# Patient Record
Sex: Female | Born: 1955 | Race: White | Hispanic: No | Marital: Married | Smoking: Former smoker
Health system: Southern US, Community
[De-identification: ages and names within clinical notes are randomized; demographics above are authoritative.]

## PROBLEM LIST (undated history)

## (undated) DIAGNOSIS — N189 Chronic kidney disease, unspecified: Secondary | ICD-10-CM

## (undated) DIAGNOSIS — Q249 Congenital malformation of heart, unspecified: Secondary | ICD-10-CM

## (undated) DIAGNOSIS — H269 Unspecified cataract: Secondary | ICD-10-CM

## (undated) DIAGNOSIS — I639 Cerebral infarction, unspecified: Secondary | ICD-10-CM

## (undated) DIAGNOSIS — M7580 Other shoulder lesions, unspecified shoulder: Secondary | ICD-10-CM

## (undated) DIAGNOSIS — R011 Cardiac murmur, unspecified: Secondary | ICD-10-CM

## (undated) DIAGNOSIS — T7840XA Allergy, unspecified, initial encounter: Secondary | ICD-10-CM

## (undated) DIAGNOSIS — K219 Gastro-esophageal reflux disease without esophagitis: Secondary | ICD-10-CM

## (undated) DIAGNOSIS — G43109 Migraine with aura, not intractable, without status migrainosus: Secondary | ICD-10-CM

## (undated) DIAGNOSIS — M25561 Pain in right knee: Secondary | ICD-10-CM

## (undated) DIAGNOSIS — I10 Essential (primary) hypertension: Secondary | ICD-10-CM

## (undated) DIAGNOSIS — E785 Hyperlipidemia, unspecified: Secondary | ICD-10-CM

## (undated) HISTORY — PX: CHOLECYSTECTOMY: SHX55

## (undated) HISTORY — DX: Cerebral infarction, unspecified: I63.9

## (undated) HISTORY — DX: Congenital malformation of heart, unspecified: Q24.9

## (undated) HISTORY — DX: Other shoulder lesions, unspecified shoulder: M75.80

## (undated) HISTORY — DX: Chronic kidney disease, unspecified: N18.9

## (undated) HISTORY — DX: Unspecified cataract: H26.9

## (undated) HISTORY — DX: Allergy, unspecified, initial encounter: T78.40XA

## (undated) HISTORY — DX: Gastro-esophageal reflux disease without esophagitis: K21.9

## (undated) HISTORY — PX: GALLBLADDER SURGERY: SHX652

## (undated) HISTORY — PX: OTHER SURGICAL HISTORY: SHX169

## (undated) HISTORY — DX: Cardiac murmur, unspecified: R01.1

## (undated) HISTORY — DX: Pain in right knee: M25.561

## (undated) HISTORY — DX: Essential (primary) hypertension: I10

## (undated) HISTORY — DX: Hyperlipidemia, unspecified: E78.5

## (undated) HISTORY — DX: Migraine with aura, not intractable, without status migrainosus: G43.109

---
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
## 1986-07-03 HISTORY — PX: TUBAL LIGATION: SHX77

## 1998-07-03 HISTORY — PX: OTHER SURGICAL HISTORY: SHX169

## 1998-07-03 HISTORY — PX: FOOT TENDON SURGERY: SHX958

## 2008-11-24 ENCOUNTER — Ambulatory Visit: Payer: Self-pay | Admitting: Family Medicine

## 2012-10-31 HISTORY — PX: OTHER SURGICAL HISTORY: SHX169

## 2012-12-27 LAB — BASIC METABOLIC PANEL
Creatinine: 1 mg/dL (ref 0.5–1.1)
Potassium: 4.3 mmol/L (ref 3.4–5.3)

## 2013-06-10 ENCOUNTER — Ambulatory Visit (INDEPENDENT_AMBULATORY_CARE_PROVIDER_SITE_OTHER): Payer: BC Managed Care – PPO | Admitting: Family Medicine

## 2013-06-10 ENCOUNTER — Encounter: Payer: Self-pay | Admitting: Family Medicine

## 2013-06-10 VITALS — BP 143/82 | HR 96 | Ht 64.5 in | Wt 149.0 lb

## 2013-06-10 DIAGNOSIS — G43109 Migraine with aura, not intractable, without status migrainosus: Secondary | ICD-10-CM

## 2013-06-10 DIAGNOSIS — F418 Other specified anxiety disorders: Secondary | ICD-10-CM

## 2013-06-10 DIAGNOSIS — IMO0001 Reserved for inherently not codable concepts without codable children: Secondary | ICD-10-CM

## 2013-06-10 DIAGNOSIS — M25561 Pain in right knee: Secondary | ICD-10-CM | POA: Insufficient documentation

## 2013-06-10 DIAGNOSIS — E785 Hyperlipidemia, unspecified: Secondary | ICD-10-CM

## 2013-06-10 DIAGNOSIS — I10 Essential (primary) hypertension: Secondary | ICD-10-CM

## 2013-06-10 DIAGNOSIS — G459 Transient cerebral ischemic attack, unspecified: Secondary | ICD-10-CM

## 2013-06-10 DIAGNOSIS — M25569 Pain in unspecified knee: Secondary | ICD-10-CM

## 2013-06-10 DIAGNOSIS — Z1239 Encounter for other screening for malignant neoplasm of breast: Secondary | ICD-10-CM

## 2013-06-10 DIAGNOSIS — F411 Generalized anxiety disorder: Secondary | ICD-10-CM

## 2013-06-10 DIAGNOSIS — Q219 Congenital malformation of cardiac septum, unspecified: Secondary | ICD-10-CM

## 2013-06-10 DIAGNOSIS — G43809 Other migraine, not intractable, without status migrainosus: Secondary | ICD-10-CM

## 2013-06-10 HISTORY — DX: Migraine with aura, not intractable, without status migrainosus: G43.109

## 2013-06-10 HISTORY — DX: Hyperlipidemia, unspecified: E78.5

## 2013-06-10 HISTORY — DX: Reserved for inherently not codable concepts without codable children: IMO0001

## 2013-06-10 HISTORY — DX: Pain in right knee: M25.561

## 2013-06-10 HISTORY — DX: Essential (primary) hypertension: I10

## 2013-06-10 HISTORY — DX: Other specified anxiety disorders: F41.8

## 2013-06-10 HISTORY — DX: Transient cerebral ischemic attack, unspecified: G45.9

## 2013-06-10 MED ORDER — LISINOPRIL 20 MG PO TABS: 20.0000 mg | ORAL_TABLET | Freq: Every day | ORAL | Status: DC

## 2013-06-10 MED ORDER — CLONAZEPAM 0.5 MG PO TABS: 0.2500 mg | ORAL_TABLET | Freq: Every day | ORAL | Status: DC | PRN

## 2013-06-10 NOTE — Progress Notes (Signed)
CC: Lindsay Moore is a 57 y.o. female is here for Establish Care   Subjective: HPI:  HTN: History of hypertension has been taking lisinopril a daily basis for approximately 3 years. She reports that home blood pressures are consistently below 120/80. She denies hypo-tensive like symptoms. Denies cough or known side effects  HLD: History of hyperlipidemia she's unsure what her last LDL was but has been checked over a year ago. She denies any history of cardiovascular or peripheral vascular disease other than a questionable TIAs listed below.  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 She was evaluated [facility] per her report for at least 24 hours and was told that she had a hole in her heart she is unsure where possibly the septum. She tells me she had seen a neurologist after this hospitalization was told only to start a baby aspirin there is no definitive diagnosis of whether or not this is an ocular migraine or TIA. Since then she denies any new motor or sensory disturbances or any visual disturbances.  Reports a history of right knee pain ever since an accident at school she is currently seeing workers compensation for management of this  Patient complains of stress that is of moderate severity interfering with her quality of life that is causing subjective anxiety. This is predominantly caused by her mother moving in with them nothing else seems to make it better or worse.  Reports overwhelming feelings of responsibility towards her mother this occurs once a week and usually will last hours. Rare alcohol use no tobacco or recreational drug use  Review of Systems - General ROS: negative for - chills, fever, night sweats, weight gain or weight  loss Ophthalmic ROS: negative for - decreased vision Psychological ROS: negative for -  depression ENT ROS: negative for - hearing change, nasal congestion, tinnitus or allergies Hematological and Lymphatic ROS: negative for - bleeding problems, bruising or swollen lymph nodes Breast ROS: negative Respiratory ROS: no cough, shortness of breath, or wheezing Cardiovascular ROS: no chest pain or dyspnea on exertion Gastrointestinal ROS: no abdominal pain, change in bowel habits, or black or bloody stools Genito-Urinary ROS: negative for - genital discharge, genital ulcers, incontinence or abnormal bleeding from genitals Musculoskeletal ROS: negative for - joint pain or muscle pain other than right knee pain above Neurological ROS: negative for - headaches or memory loss Dermatological ROS: negative for lumps, mole changes, rash and skin lesion changes  Past Medical History  Diagnosis Date  . Essential hypertension, benign 06/10/2013  . Hyperlipidemia 06/10/2013  . 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 Right knee pain 06/10/2013    Workers Comp: [facility] Dr. Luiz Blare      History reviewed. No pertinent family history.   History  Substance Use Topics  . Smoking status: Former Games developer  . Smokeless tobacco: Not on file  . Alcohol Use: .5 - 1 oz/week    1-2 drink(s) per week     Objective: Filed Vitals:   06/10/13 1606  BP: 143/82  Pulse: 96    General: Alert and Oriented, No Acute Distress HEENT: Pupils equal, round, reactive to light. Conjunctivae clear.  Moist mucous membranes pharynx unremarkable Lungs: Clear to auscultation bilaterally, no wheezing/ronchi/rales.  Comfortable work of breathing. Good air movement. Cardiac: Regular rate and  rhythm. Normal S1/S2.  No murmurs, rubs, nor gallops.   Abdomen: Mild overweight soft nontender Extremities: No peripheral edema.  Strong peripheral pulses.  Mental Status: No depression, nor agitation however mild anxiety and tearfulness when  speaking about her mother Skin: Warm and dry.  Assessment & Plan: Lindsay was seen today for establish care.  Diagnoses and associated orders for this visit:  Essential hypertension, benign - BASIC METABOLIC PANEL WITH GFR - lisinopril (PRINIVIL,ZESTRIL) 20 MG tablet; Take 1 tablet (20 mg total) by mouth daily.  TIA (transient ischemic attack)  Ocular migraine  Septal defect  Screening for breast cancer - MM Digital Screening; Future  Right knee pain  Situational anxiety - clonazePAM (KLONOPIN) 0.5 MG tablet; Take 0.5-1 tablets (0.25-0.5 mg total) by mouth daily as needed for anxiety.  Hyperlipidemia - Lipid panel    Essential hypertension: Uncontrolled chronic condition, we discussed the DASH diet will recheck blood pressure in one week when she returns continue lisinopril for now Transient ischemic attack: We Will request outside records from [facility] to determine whether or not she truly had a TIA and to clarify her subjective septal defect in her heart Breast cancer screening overdue for mammogram were placed Right knee pain: Deferred to [facility] orthopedics Situational anxiety: Start as needed clonazepam for uncontrollable anxiety not to use more than 2-3 times a week. Hyperlipidemia: Overdue for lipid panel  45 minutes spent face-to-face during visit today of which at least 50% was counseling or coordinating care regarding hyperlipidemia, essential hypertension, TIA, ocular migraine, septal defect, situational anxiety.   Return in about 1 week (around 06/17/2013) for CPE.

## 2013-06-11 ENCOUNTER — Encounter: Payer: Self-pay | Admitting: *Deleted

## 2013-06-13 ENCOUNTER — Telehealth: Payer: Self-pay | Admitting: Family Medicine

## 2013-06-13 ENCOUNTER — Encounter: Payer: Self-pay | Admitting: Family Medicine

## 2013-06-13 LAB — LIPID PANEL
Cholesterol: 206 mg/dL — ABNORMAL HIGH (ref 0–200)
HDL: 69 mg/dL (ref >39)
LDL Cholesterol: 123 mg/dL — ABNORMAL HIGH (ref 0–99)
Total CHOL/HDL Ratio: 3 Ratio
Triglycerides: 69 mg/dL (ref <150)
VLDL: 14 mg/dL (ref 0–40)

## 2013-06-13 LAB — BASIC METABOLIC PANEL WITH GFR
BUN: 14 mg/dL (ref 6–23)
Calcium: 9.5 mg/dL (ref 8.4–10.5)
Chloride: 107 mEq/L (ref 96–112)
Creat: 0.9 mg/dL (ref 0.50–1.10)
GFR, Est African American: 82 mL/min
GFR, Est Non African American: 71 mL/min

## 2013-06-13 NOTE — Telephone Encounter (Signed)
I met patient in the [place], she said to let you know that she got her colonoscopy done by a Dr Noelle Penner in [place].

## 2013-06-18 ENCOUNTER — Other Ambulatory Visit: Payer: Self-pay | Admitting: Family Medicine

## 2013-06-18 ENCOUNTER — Telehealth: Payer: Self-pay | Admitting: *Deleted

## 2013-06-18 ENCOUNTER — Ambulatory Visit (INDEPENDENT_AMBULATORY_CARE_PROVIDER_SITE_OTHER): Payer: BC Managed Care – PPO | Admitting: Family Medicine

## 2013-06-18 ENCOUNTER — Encounter: Payer: Self-pay | Admitting: Family Medicine

## 2013-06-18 VITALS — BP 134/84 | HR 106 | Ht 64.5 in | Wt 149.0 lb

## 2013-06-18 DIAGNOSIS — Z0000 Encounter for general adult medical examination without abnormal findings: Secondary | ICD-10-CM

## 2013-06-18 DIAGNOSIS — K644 Residual hemorrhoidal skin tags: Secondary | ICD-10-CM

## 2013-06-18 MED ORDER — HYDROCORTISONE ACE-PRAMOXINE 2.5-1 % EX CREA: TOPICAL_CREAM | CUTANEOUS | Status: DC

## 2013-06-18 NOTE — Progress Notes (Signed)
CC: Lindsay Moore is a 57 y.o. female is here for Annual Exam   Subjective: HPI:  Colonoscopy:Dr. Noelle Penner in [place] Unknown date, we are requesting results from this Papsmear: Received normal last year, no history of abnormals we'll consider retesting 2-4 years Mammogram: Order placed last week, scheduled on 30th of this month   Influenza Vaccine: UTD Pneumovax: No current indication Td/Tdap:  She believes this was done well within 10 years Zoster: (Start 57 yo)  Over the past 2 weeks have you been bothered by: - Little interest or pleasure in doing things: No - Feeling down depressed or hopeless: No  Her only complaint today is occasional hemorrhoids that cause itching but no other discomfort she currently is a symptomatic without any rectal discomfort or annoyance.  Rare alcohol use no tobacco or recreational drug use. No formal exercise routine tries to watch what she eats but admits for room for improvement, admits diet high in breads  Review of Systems - General ROS: negative for - chills, fever, night sweats, weight gain or weight loss Ophthalmic ROS: negative for - decreased vision Psychological ROS: negative for - anxiety or depression ENT ROS: negative for - hearing change, nasal congestion, tinnitus or allergies Hematological and Lymphatic ROS: negative for - bleeding problems, bruising or swollen lymph nodes Breast ROS: negative Respiratory ROS: no cough, shortness of breath, or wheezing Cardiovascular ROS: no chest pain or dyspnea on exertion Gastrointestinal ROS: no abdominal pain, change in bowel habits, or black or bloody stools Genito-Urinary ROS: negative for - genital discharge, genital ulcers, incontinence or abnormal bleeding from genitals Musculoskeletal ROS: negative for - joint pain or muscle pain Neurological ROS: negative for - headaches or memory loss Dermatological ROS: negative for lumps, mole changes, rash and skin lesion changes  Past Medical History   Diagnosis Date  . Essential hypertension, benign 06/10/2013  . Hyperlipidemia 06/10/2013  . 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 Right knee pain 06/10/2013    Workers Comp: [facility] Dr. Luiz Blare   . Heart defect     "hole in the heart"     Family History  Problem Relation Age of Onset  . Alcoholism Father   . Lung cancer Father   . Diabetes      grandmother  . Hyperlipidemia Mother   . Hypertension Mother   . Stroke      grandmother     History  Substance Use Topics  . Smoking status: Former Games developer  . Smokeless tobacco: Not on file  . Alcohol Use: .5 - 1 oz/week    1-2 drink(s) per week     Objective: Filed Vitals:   06/18/13 0859  BP: 134/84  Pulse: 106    General: No Acute Distress HEENT: Atraumatic, normocephalic, conjunctivae normal without scleral icterus.  No nasal discharge, hearing grossly intact, TMs with good landmarks bilaterally with no middle ear abnormalities, posterior pharynx clear without oral lesions. Mild eczematous changes on the external ear canal bilaterally Neck: Supple, trachea midline, no cervical nor supraclavicular adenopathy. Pulmonary: Clear to auscultation bilaterally without wheezing, rhonchi, nor rales. Cardiac: Regular rate and rhythm.  No murmurs, rubs, nor gallops. No peripheral edema.  2+ peripheral pulses bilaterally. Abdomen: Bowel sounds normal.  No masses.  Non-tender without rebound.  Negative Murphy's sign. MSK: Grossly intact, no signs of weakness.  Full strength throughout upper and lower extremities.  Full ROM in upper and lower extremities.  No midline spinal tenderness. Neuro: Gait unremarkable, CN II-XII grossly intact.  C5-C6 Reflex 2/4 Bilaterally,  L4 Reflex 2/4 Bilaterally.  Cerebellar function intact. Skin: No rashes. There is a noninflamed skin tag on the back between the scapula.  Psych: Alert and oriented to person/place/time.  Thought process normal. No anxiety/depression.   Assessment & Plan: Lindsay Moore was seen  today for annual exam.  Diagnoses and associated orders for this visit:  Annual physical exam  Hemorrhoids, external - Pramoxine-HC (HYDROCORTISONE ACE-PRAMOXINE) 2.5-1 % CREA; Apply to rectal area twice a day as needed for itching.    Healthy lifestyle interventions including but limited to regular exercise, a healthy low fat diet, moderation of salt intake, the dangers of tobacco/alcohol/recreational drug use, nutrition supplementation, and accident avoidance were discussed with the patient and a handout was provided for future reference. Encouraged her to switch from white bread to whole wheat and also to look for other options to increase whole grains in her diet to help with lowering cholesterol and also possibly weight loss  Awaiting colonoscopy records...  Return in about 3 months (around 09/16/2013) for Routine follow up.

## 2013-06-18 NOTE — Patient Instructions (Signed)
Dr. Reola Buckles's General Advice Following Your Complete Physical Exam  The Benefits of Regular Exercise: Unless you suffer from an uncontrolled cardiovascular condition, studies strongly suggest that regular exercise and physical activity will add to both the quality and length of your life.  The World Health Organization recommends 150 minutes of moderate intensity aerobic activity every week.  This is best split over 3-4 days a week, and can be as simple as a brisk walk for just over 35 minutes "most days of the week".  This type of exercise has been shown to lower LDL-Cholesterol, lower average blood sugars, lower blood pressure, lower cardiovascular disease risk, improve memory, and increase one's overall sense of wellbeing.  The addition of anaerobic (or "strength training") exercises offers additional benefits including but not limited to increased metabolism, prevention of osteoporosis, and improved overall cholesterol levels.  How Can I Strive For A Low-Fat Diet?: Current guidelines recommend that 25-35 percent of your daily energy (food) intake should come from fats.  One might ask how can this be achieved without having to dissect each meal on a daily basis?  Switch to skim or 1% milk instead of whole milk.  Focus on lean meats such as ground turkey, fresh fish, baked chicken, and lean cuts of beef as your source of dietary protein.  Consume less than 300mg/day of dietary cholesterol.  Limit trans fatty acid consumption primarily by limiting synthetic trans fats such as partially hydrogenated oils (Ex: fried fast foods).  Focus efforts on reducing your intake of "solid" fats (Ex: Butter).  Substitute olive or vegetable oil for solid fats where possible.  Moderation of Salt Intake: Provided you don't carry a diagnosis of congestive heart failure nor renal failure, I recommend a daily allowance of no more than 2300 mg of salt (sodium).  Keeping under this daily goal is associated with a  decreased risk of cardiovascular events, creeping above it can lead to elevated blood pressures and increases your risk of cardiovascular events.  Milligrams (mg) of salt is listed on all nutrition labels, and your daily intake can add up faster than you think.  Most canned and frozen dinners can pack in over half your daily salt allowance in one meal.    Lifestyle Health Risks: Certain lifestyle choices carry specific health risks.  As you may already know, tobacco use has been associated with increasing one's risk of cardiovascular disease, pulmonary disease, numerous cancers, among many other issues.  What you may not know is that there are medications and nicotine replacement strategies that can more than double your chances of successfully quitting.  I would be thrilled to help manage your quitting strategy if you currently use tobacco products.  When it comes to alcohol use, I've yet to find an "ideal" daily allowance.  Provided an individual does not have a medical condition that is exacerbated by alcohol consumption, general guidelines determine "safe drinking" as no more than two standard drinks for a man or no more than one standard drink for a female per day.  However, much debate still exists on whether any amount of alcohol consumption is technically "safe".  My general advice, keep alcohol consumption to a minimum for general health promotion.  If you or others believe that alcohol, tobacco, or recreational drug use is interfering with your life, I would be happy to provide confidential counseling regarding treatment options.  General "Over The Counter" Nutrition Advice: Postmenopausal women should aim for a daily calcium intake of 1200 mg, however a significant   portion of this might already be provided by diets including milk, yogurt, cheese, and other dairy products.  Vitamin D has been shown to help preserve bone density, prevent fatigue, and has even been shown to help reduce falls in the  elderly.  Ensuring a daily intake of 800 Units of Vitamin D is a good place to start to enjoy the above benefits, we can easily check your Vitamin D level to see if you'd potentially benefit from supplementation beyond 800 Units a day.  Folic Acid intake should be of particular concern to women of childbearing age.  Daily consumption of 400-800 mcg of Folic Acid is recommended to minimize the chance of spinal cord defects in a fetus should pregnancy occur.    For many adults, accidents still remain one of the most common culprits when it comes to cause of death.  Some of the simplest but most effective preventitive habits you can adopt include regular seatbelt use, proper helmet use, securing firearms, and regularly testing your smoke and carbon monoxide detectors.  Lorrena Goranson B. [facility] DO [facility] [place] [address], [address] [place], [place] [address] Phone: [phone]  Breast Self-Awareness Practicing breast self-awareness may pick up problems early, prevent significant medical complications, and possibly save your life. By practicing breast self-awareness, you can become familiar with how your breasts look and feel and if your breasts are changing. This allows you to notice changes early. It can also offer you some reassurance that your breast health is good. One way to learn what is normal for your breasts and whether your breasts are changing is to do a breast self-exam. If you find a lump or something that was not present in the past, it is best to contact your caregiver right away. Other findings that should be evaluated by your caregiver include nipple discharge, especially if it is bloody; skin changes or reddening; areas where the skin seems to be pulled in (retracted); or new lumps and bumps. Breast pain is seldom associated with cancer (malignancy), but should also be evaluated by a caregiver. HOW TO PERFORM A BREAST SELF-EXAM The best time to examine your breasts is 5 7 days after  your menstrual period is over. During menstruation, the breasts are lumpier, and it may be more difficult to pick up changes. If you do not menstruate, have reached menopause, or had your uterus removed (hysterectomy), you should examine your breasts at regular intervals, such as monthly. If you are breastfeeding, examine your breasts after a feeding or after using a breast pump. Breast implants do not decrease the risk for lumps or tumors, so continue to perform breast self-exams as recommended. Talk to your caregiver about how to determine the difference between the implant and breast tissue. Also, talk about the amount of pressure you should use during the exam. Over time, you will become more familiar with the variations of your breasts and more comfortable with the exam. A breast self-exam requires you to remove all your clothes above the waist. 1. Look at your breasts and nipples. Stand in front of a mirror in a room with good lighting. With your hands on your hips, push your hands firmly downward. Look for a difference in shape, contour, and size from one breast to the other (asymmetry). Asymmetry includes puckers, dips, or bumps. Also, look for skin changes, such as reddened or scaly areas on the breasts. Look for nipple changes, such as discharge, dimpling, repositioning, or redness. 2. Carefully feel your breasts. This is  best done either in the shower or tub while using soapy water or when flat on your back. Place the arm (on the side of the breast you are examining) above your head. Use the pads (not the fingertips) of your three middle fingers on your opposite hand to feel your breasts. Start in the underarm area and use  inch (2 cm) overlapping circles to feel your breast. Use 3 different levels of pressure (light, medium, and firm pressure) at each circle before moving to the next circle. The light pressure is needed to feel the tissue closest to the skin. The medium pressure will help to feel  breast tissue a little deeper, while the firm pressure is needed to feel the tissue close to the ribs. Continue the overlapping circles, moving downward over the breast until you feel your ribs below your breast. Then, move one finger-width towards the center of the body. Continue to use the  inch (2 cm) overlapping circles to feel your breast as you move slowly up toward the collar bone (clavicle) near the base of the neck. Continue the up and down exam using all 3 pressures until you reach the middle of the chest. Do this with each breast, carefully feeling for lumps or changes. 3.  Keep a written record with breast changes or normal findings for each breast. By writing this information down, you do not need to depend only on memory for size, tenderness, or location. Write down where you are in your menstrual cycle, if you are still menstruating. Breast tissue can have some lumps or thick tissue. However, see your caregiver if you find anything that concerns you.  SEEK MEDICAL CARE IF:  You see a change in shape, contour, or size of your breasts or nipples.   You see skin changes, such as reddened or scaly areas on the breasts or nipples.   You have an unusual discharge from your nipples.   You feel a new lump or unusually thick areas.  Document Released: 06/19/2005 Document Revised: 06/05/2012 Document Reviewed: 10/04/2011 [facility] Patient Information 2014 [place], [place].

## 2013-06-19 ENCOUNTER — Encounter: Payer: Self-pay | Admitting: *Deleted

## 2013-06-19 ENCOUNTER — Telehealth: Payer: Self-pay | Admitting: Family Medicine

## 2013-06-19 DIAGNOSIS — Z860100 Personal history of colon polyps, unspecified: Secondary | ICD-10-CM

## 2013-06-19 DIAGNOSIS — Z1211 Encounter for screening for malignant neoplasm of colon: Secondary | ICD-10-CM | POA: Insufficient documentation

## 2013-06-19 HISTORY — DX: Personal history of colon polyps, unspecified: Z86.0100

## 2013-06-19 HISTORY — DX: Personal history of colonic polyps: Z86.010

## 2013-06-19 NOTE — Telephone Encounter (Signed)
Sue Lush, Will you please let Lindsay Moore know that her colonoscopy from 2011 does not need to be repeated until 2016.

## 2013-06-19 NOTE — Telephone Encounter (Signed)
Pt informed.  Luz Mares, LPN  

## 2013-06-24 ENCOUNTER — Encounter: Payer: Self-pay | Admitting: *Deleted

## 2013-07-01 ENCOUNTER — Ambulatory Visit (INDEPENDENT_AMBULATORY_CARE_PROVIDER_SITE_OTHER): Payer: BC Managed Care – PPO

## 2013-07-01 DIAGNOSIS — Z1231 Encounter for screening mammogram for malignant neoplasm of breast: Secondary | ICD-10-CM

## 2013-07-01 DIAGNOSIS — R928 Other abnormal and inconclusive findings on diagnostic imaging of breast: Secondary | ICD-10-CM

## 2013-07-01 DIAGNOSIS — Z1239 Encounter for other screening for malignant neoplasm of breast: Secondary | ICD-10-CM

## 2013-07-07 ENCOUNTER — Other Ambulatory Visit: Payer: Self-pay | Admitting: Family Medicine

## 2013-07-07 DIAGNOSIS — R928 Other abnormal and inconclusive findings on diagnostic imaging of breast: Secondary | ICD-10-CM

## 2013-07-10 ENCOUNTER — Ambulatory Visit
Admission: RE | Admit: 2013-07-10 | Discharge: 2013-07-10 | Disposition: A | Discharge status: Home / Self Care | Payer: BC Managed Care – PPO | Source: Ambulatory Visit | Attending: Family Medicine | Admitting: Family Medicine

## 2013-07-10 DIAGNOSIS — R928 Other abnormal and inconclusive findings on diagnostic imaging of breast: Secondary | ICD-10-CM

## 2013-07-11 ENCOUNTER — Encounter: Payer: Self-pay | Admitting: Family Medicine

## 2013-07-11 DIAGNOSIS — N63 Unspecified lump in unspecified breast: Secondary | ICD-10-CM

## 2013-07-11 HISTORY — DX: Unspecified lump in unspecified breast: N63.0

## 2013-09-16 ENCOUNTER — Ambulatory Visit (INDEPENDENT_AMBULATORY_CARE_PROVIDER_SITE_OTHER): Payer: BC Managed Care – PPO | Admitting: Family Medicine

## 2013-09-16 ENCOUNTER — Encounter: Payer: Self-pay | Admitting: Family Medicine

## 2013-09-16 VITALS — BP 137/77 | HR 91 | Ht 64.0 in | Wt 149.0 lb

## 2013-09-16 DIAGNOSIS — F411 Generalized anxiety disorder: Secondary | ICD-10-CM

## 2013-09-16 DIAGNOSIS — F418 Other specified anxiety disorders: Secondary | ICD-10-CM

## 2013-09-16 DIAGNOSIS — I10 Essential (primary) hypertension: Secondary | ICD-10-CM

## 2013-09-16 MED ORDER — LISINOPRIL 20 MG PO TABS: 20.0000 mg | ORAL_TABLET | Freq: Every day | ORAL | Status: DC

## 2013-09-16 NOTE — Progress Notes (Signed)
CC: Lindsay LangeChristina Moore is a 58 y.o. female is here for Follow-up   Subjective: HPI:  Followup essential hypertension: Continues on lisinopril and daily basis without missed doses nor outside blood pressures to report. Denies known side effects. Denies chest pain shortness of breath orthopnea peripheral edema nor motor or sensory disturbances nor cough  Following situational anxiety: States that she has not used any clonazepam since I prescribed it initially, it helps her to know that it is there for needed however she is not wanted to start it yet. Anxiety seems to be worse since I saw her last due to a fracture of her mother's hand requiring the patient to be more accessible and available for her mother. Symptoms are described on his anxiety and irritability or worsened by the demands and dependency of her mother when it comes to driving, grooming, and independent activities of daily living.  Symptoms are present on daily basis mild to moderate in severity it improves with a half glass of wine most nights a week.  Denies depression, paranoia, nor mental disturbance other than that described above. There's been no sleep disturbance   Review Of Systems Outlined In HPI  Past Medical History  Diagnosis Date  . Essential hypertension, benign 06/10/2013  . Hyperlipidemia 06/10/2013  . 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 Right knee pain 06/10/2013    Workers Comp: [facility] Dr. Luiz Moore   . 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 Orthoscopic knee surgery  10/2012  . Gallbladder surgery    . Broken hip  2000  . Foot tendon surgery  2000  . Tubal ligation  1988  . Tonsillectomy  1959   Family History  Problem Relation Age of Onset  . Alcoholism Father   . Lung cancer Father   . Diabetes      grandmother  . Hyperlipidemia Mother   . Hypertension Mother   . Stroke      grandmother    History   Social History  . Marital Status: Married   Spouse Name: N/A    Number of Children: N/A  . Years of Education: N/A   Occupational History  . Not on file.   Social History Main Topics  . Smoking status: Former Games developermoker  . Smokeless tobacco: Not on file  . Alcohol Use: .5 - 1 oz/week    1-2 drink(s) per week  . Drug Use: No  . Sexual Activity: Yes    Partners: Male   Other Topics Concern  . Not on file   Social History Narrative  . No narrative on file     Objective: BP 137/77  Pulse 91  Ht 5\' 4"  (1.626 m)  Wt 149 lb (67.586 kg)  BMI 25.56 kg/m2  General: Alert and Oriented, No Acute Distress HEENT: Pupils equal, round, reactive to light. Conjunctivae clear.  Moist membranes pharynx unremarkable Lungs: Clear to auscultation bilaterally, no wheezing/ronchi/rales.  Comfortable work of breathing. Good air movement. Cardiac: Regular rate and rhythm. Normal S1/S2.  No murmurs, rubs, nor gallops.   Extremities: No peripheral edema.  Strong peripheral pulses.  Mental Status: No depression, anxiety, nor agitation. Skin: Warm and dry.  Assessment & Plan: Lindsay Moore was seen today for follow-up.  Diagnoses and associated orders for this visit:  Essential hypertension, benign - lisinopril (PRINIVIL,ZESTRIL) 20 MG tablet; Take 1 tablet (20 mg total) by mouth daily.  Situational anxiety    Essential hypertension:  Controlled continue lisinopril Situational anxiety: She would still prefer not to take medication in order to manage this on her own. We discussed  not to rely on alcohol by insuring she drink no more than a glass of wine every night.  Discussed that they should consider assisted living facility if anxiety is interfering with quality of life and family dynamics at home.  Discussed benefits counseling if she's interested however declined at this time  25 minutes spent face-to-face during visit today of which at least 50% was counseling or coordinating care regarding: 1. Essential hypertension, benign   2.  Situational anxiety          Return in about 6 months (around 03/19/2014).

## 2013-09-22 ENCOUNTER — Encounter: Payer: Self-pay | Admitting: Family Medicine

## 2013-09-22 ENCOUNTER — Ambulatory Visit (INDEPENDENT_AMBULATORY_CARE_PROVIDER_SITE_OTHER): Payer: BC Managed Care – PPO | Admitting: Family Medicine

## 2013-09-22 VITALS — BP 138/81 | HR 73 | Wt 146.0 lb

## 2013-09-22 DIAGNOSIS — I10 Essential (primary) hypertension: Secondary | ICD-10-CM

## 2013-09-22 DIAGNOSIS — T783XXA Angioneurotic edema, initial encounter: Secondary | ICD-10-CM

## 2013-09-22 DIAGNOSIS — Z09 Encounter for follow-up examination after completed treatment for conditions other than malignant neoplasm: Secondary | ICD-10-CM

## 2013-09-22 MED ORDER — AMLODIPINE BESYLATE 5 MG PO TABS: 5.0000 mg | ORAL_TABLET | Freq: Every day | ORAL | Status: DC

## 2013-09-22 NOTE — Progress Notes (Signed)
CC: Lindsay Moore is a 58 y.o. female is here for f/u ER tongue swelling   Subjective: HPI:  Reports tongue swelling and lip swelling that occurred on Saturday evening that was gradual in onset reaching moderate severity to the point where she had difficulty swallowing, at that point she went to the local emergency room and was given an injection of Benadryl and dexamethasone, discomfort and swelling began to subside within an hour and she was discharged home. She is taking prednisone 60 mg daily but did not take today's dose because of concerns with facial flushing of moderate severity and lingering numbness in the tongue and lips which is mild in severity.  She's never had any of the above symptoms before. She has been on lisinopril for years without known side effects but understandably this was stopped at the emergency room and changed to amlodipine.  She denies constipation or peripheral edema since starting amlodipine.  On Friday and Saturday she denies doing anything out of the ordinary or consuming anything outside of her usual diet. Recently remotely denies shortness of breath, wheezing, regurgitation, nausea, vomiting, diarrhea nor bowel wall pain.   Review Of Systems Outlined In HPI  Past Medical History  Diagnosis Date  . Essential hypertension, benign 06/10/2013  . Hyperlipidemia 06/10/2013  . 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 Right knee pain 06/10/2013    Workers Comp: [facility] Dr. Luiz Blare   . 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 Orthoscopic knee surgery  10/2012  . Gallbladder surgery    . Broken hip  2000  . Foot tendon surgery  2000  . Tubal ligation  1988  . Tonsillectomy  1959   Family History  Problem Relation Age of Onset  . Alcoholism Father   . Lung cancer Father   . Diabetes      grandmother  . Hyperlipidemia Mother   . Hypertension Mother   . Stroke      grandmother    History   Social History  .  Marital Status: Married    Spouse Name: N/A    Number of Children: N/A  . Years of Education: N/A   Occupational History  . Not on file.   Social History Main Topics  . Smoking status: Former Games developer  . Smokeless tobacco: Not on file  . Alcohol Use: .5 - 1 oz/week    1-2 drink(s) per week  . Drug Use: No  . Sexual Activity: Yes    Partners: Male   Other Topics Concern  . Not on file   Social History Narrative  . No narrative on file     Objective: BP 138/81  Pulse 73  Wt 146 lb (66.225 kg)  General: Alert and Oriented, No Acute Distress HEENT: Pupils equal, round, reactive to light. Conjunctivae clear.  External ears unremarkable, canals clear with intact TMs with appropriate landmarks.  Middle ear appears open without effusion. Pink inferior turbinates.  Moist mucous membranes, pharynx without inflammation nor lesions.  Neck supple without palpable lymphadenopathy nor abnormal masses. Lungs: Clear to auscultation bilaterally, no wheezing/ronchi/rales.  Comfortable work of breathing. Good air movement. Cardiac: Regular rate and rhythm. Normal S1/S2.  No murmurs, rubs, nor gallops.   Mental Status: No depression, anxiety, nor agitation. Skin: Warm and dry. Mild facial erythema in the cheeks Cranial nerves 2-12 grossly intact  Assessment & Plan: Eh was seen today for f/u er tongue swelling.  Diagnoses and associated orders for this visit:  Hospital discharge follow-up  Angioedema  Essential hypertension, benign - amLODipine (NORVASC) 5 MG tablet; Take 1 tablet (5 mg total) by mouth daily.    Hospital discharge for angioedema which is now significantly resolving. Due to side effects of prednisone and the fact that she has already received dexamethasone I encouraged her to drop prednisone dose to 40 mg daily with the last dose being on Thursday. She started taking ranitidine but she can also consider adding 50 mg of Benadryl up to 3 times a day as needed for any  tongue or lip numbness causing a decline in her quality of life. Essential hypertension: Amlodipine seems to be keeping blood pressure under control  25 minutes spent face-to-face during visit today of which at least 50% was counseling or coordinating care regarding: 1. Hospital discharge follow-up   2. Angioedema   3. Essential hypertension, benign      Return in about 3 months (around 12/23/2013).

## 2013-11-05 ENCOUNTER — Telehealth: Payer: Self-pay | Admitting: *Deleted

## 2013-11-05 DIAGNOSIS — I10 Essential (primary) hypertension: Secondary | ICD-10-CM

## 2013-11-05 NOTE — Telephone Encounter (Signed)
Pt left a message that last BP medication rx'ed seems to be causing her legs to swell. She wants to know if something else can be rx'ed.

## 2013-11-06 MED ORDER — HYDROCHLOROTHIAZIDE 25 MG PO TABS: ORAL_TABLET | ORAL | Status: DC

## 2013-11-06 NOTE — Telephone Encounter (Signed)
Pt notified and voiced understanding 

## 2013-11-06 NOTE — Telephone Encounter (Signed)
She can stop amlodipine immediately and switch to hydrochlorothiazide, expected side effect of this new medication is urinating 1-2 more during the day so be sure to take it in the morning as opposed to at bedtime.

## 2013-12-01 ENCOUNTER — Other Ambulatory Visit: Payer: Self-pay | Admitting: Family Medicine

## 2013-12-01 DIAGNOSIS — N63 Unspecified lump in unspecified breast: Secondary | ICD-10-CM

## 2013-12-02 ENCOUNTER — Other Ambulatory Visit: Payer: Self-pay

## 2013-12-02 ENCOUNTER — Other Ambulatory Visit: Payer: Self-pay | Admitting: Family Medicine

## 2013-12-02 DIAGNOSIS — N63 Unspecified lump in unspecified breast: Secondary | ICD-10-CM

## 2013-12-24 ENCOUNTER — Telehealth: Payer: Self-pay | Admitting: Family Medicine

## 2013-12-24 DIAGNOSIS — L03031 Cellulitis of right toe: Secondary | ICD-10-CM

## 2013-12-24 MED ORDER — AMOXICILLIN-POT CLAVULANATE 500-125 MG PO TABS: ORAL_TABLET | ORAL | Status: AC

## 2013-12-24 NOTE — Telephone Encounter (Signed)
Here with husband, return on Friday if no better, for lancing.

## 2014-01-06 ENCOUNTER — Encounter: Payer: Self-pay | Admitting: Sports Medicine

## 2014-01-06 ENCOUNTER — Ambulatory Visit (INDEPENDENT_AMBULATORY_CARE_PROVIDER_SITE_OTHER): Payer: BC Managed Care – PPO | Admitting: Sports Medicine

## 2014-01-06 ENCOUNTER — Ambulatory Visit (INDEPENDENT_AMBULATORY_CARE_PROVIDER_SITE_OTHER): Payer: BC Managed Care – PPO

## 2014-01-06 VITALS — BP 127/92 | HR 115 | Ht 64.0 in | Wt 150.0 lb

## 2014-01-06 DIAGNOSIS — S335XXA Sprain of ligaments of lumbar spine, initial encounter: Secondary | ICD-10-CM

## 2014-01-06 DIAGNOSIS — M5137 Other intervertebral disc degeneration, lumbosacral region: Secondary | ICD-10-CM

## 2014-01-06 DIAGNOSIS — S39012A Strain of muscle, fascia and tendon of lower back, initial encounter: Secondary | ICD-10-CM | POA: Insufficient documentation

## 2014-01-06 HISTORY — DX: Strain of muscle, fascia and tendon of lower back, initial encounter: S39.012A

## 2014-01-06 MED ORDER — PREDNISONE (PAK) 10 MG PO TABS: ORAL_TABLET | ORAL | Status: DC

## 2014-01-06 MED ORDER — METHYLPREDNISOLONE SODIUM SUCC 125 MG IJ SOLR
250.0000 mg | Freq: Once | INTRAMUSCULAR | Status: AC
  Administered 2014-01-06: 250 mg via INTRAMUSCULAR

## 2014-01-06 MED ORDER — TRAMADOL-ACETAMINOPHEN 37.5-325 MG PO TABS: 1.0000 | ORAL_TABLET | Freq: Three times a day (TID) | ORAL | Status: DC | PRN

## 2014-01-06 MED ORDER — CYCLOBENZAPRINE HCL 10 MG PO TABS: ORAL_TABLET | ORAL | Status: DC

## 2014-01-06 NOTE — Progress Notes (Signed)
  Subjective:    CC: Low back pain  HPI: This is a very pleasant 58 year old female, 2 days ago she went bowling, as she extended her back after throwing the ball, she had immediate pain in the right quadratus lumborum. Pain was severe, persistent, it's worse with flexion, but does not radiate and is predominantly axial. It is slightly worse with Valsalva. No bowel or bladder dysfunction that is new, no constitutional symptoms. She is unable to take NSAIDs, and has been using Tylenol at home without any effect.  Past medical history, Surgical history, Family history not pertinant except as noted below, Social history, Allergies, and medications have been entered into the medical record, reviewed, and no changes needed.   Review of Systems: No fevers, chills, night sweats, weight loss, chest pain, or shortness of breath.   Objective:    General: Well Developed, well nourished, and in no acute distress.  Neuro: Alert and oriented x3, extra-ocular muscles intact, sensation grossly intact.  HEENT: Normocephalic, atraumatic, pupils equal round reactive to light, neck supple, no masses, no lymphadenopathy, thyroid nonpalpable.  Skin: Warm and dry, no rashes. Cardiac: Regular rate and rhythm, no murmurs rubs or gallops, no lower extremity edema.  Respiratory: Clear to auscultation bilaterally. Not using accessory muscles, speaking in full sentences. Back Exam:  Inspection: Unremarkable  Motion: Flexion 45 deg, Extension 45 deg, Side Bending to 45 deg bilaterally,  Rotation to 45 deg bilaterally  SLR laying: Positive with reproduction of back pain but not radicular symptoms.  XSLR laying: Negative  Palpable tenderness: Tender to palpation over the right quadratus lumborum. Also has some pain in the groin with internal rotation of the hip. FABER: negative. Sensory change: Gross sensation intact to all lumbar and sacral dermatomes.  Reflexes: 2+ at both patellar tendons, 2+ at achilles tendons,  Babinski's downgoing.  Strength at foot  Plantar-flexion: 5/5 Dorsi-flexion: 5/5 Eversion: 5/5 Inversion: 5/5  Leg strength  Quad: 5/5 Hamstring: 5/5 Hip flexor: 5/5 Hip abductors: 5/5  Gait unremarkable.  Solu-Medrol 250 mg intramuscular given.  Impression and Recommendations:

## 2014-01-06 NOTE — Assessment & Plan Note (Signed)
Per patient unable to use NSAIDs. Solu-Medrol 250 mg intramuscular., Flexeril, Ultracet, x-rays. Home rehabilitation. Topical modalities. Return 2 weeks. Prednisone taper

## 2014-01-07 ENCOUNTER — Ambulatory Visit
Admission: RE | Admit: 2014-01-07 | Discharge: 2014-01-07 | Disposition: A | Discharge status: Home / Self Care | Payer: BC Managed Care – PPO | Source: Ambulatory Visit | Attending: Family Medicine | Admitting: Family Medicine

## 2014-01-07 ENCOUNTER — Encounter: Payer: Self-pay | Admitting: Family Medicine

## 2014-01-07 DIAGNOSIS — N63 Unspecified lump in unspecified breast: Secondary | ICD-10-CM

## 2014-01-20 ENCOUNTER — Ambulatory Visit (INDEPENDENT_AMBULATORY_CARE_PROVIDER_SITE_OTHER): Payer: BC Managed Care – PPO | Admitting: Sports Medicine

## 2014-01-20 ENCOUNTER — Encounter: Payer: Self-pay | Admitting: Sports Medicine

## 2014-01-20 VITALS — BP 102/73 | HR 107 | Ht 64.0 in | Wt 153.0 lb

## 2014-01-20 DIAGNOSIS — S335XXA Sprain of ligaments of lumbar spine, initial encounter: Secondary | ICD-10-CM

## 2014-01-20 DIAGNOSIS — S39012A Strain of muscle, fascia and tendon of lower back, initial encounter: Secondary | ICD-10-CM

## 2014-01-20 MED ORDER — CYCLOBENZAPRINE HCL 10 MG PO TABS: 10.0000 mg | ORAL_TABLET | Freq: Three times a day (TID) | ORAL | Status: DC | PRN

## 2014-01-20 NOTE — Progress Notes (Signed)
  Subjective:    CC: Followup  HPI: Lumbar strain: Occur approximately 2 weeks ago while bowling, weak it her heart with Solu-Medrol, prednisone, anti-inflammatories. She improved significantly, and is approximately 50-60% better, continues to have some pain that she localizes on the right side of her low back, worse with flexion, without radicular pain, pain is mild, improving.  Past medical history, Surgical history, Family history not pertinant except as noted below, Social history, Allergies, and medications have been entered into the medical record, reviewed, and no changes needed.   Review of Systems: No fevers, chills, night sweats, weight loss, chest pain, or shortness of breath.   Objective:    General: Well Developed, well nourished, and in no acute distress.  Neuro: Alert and oriented x3, extra-ocular muscles intact, sensation grossly intact.  HEENT: Normocephalic, atraumatic, pupils equal round reactive to light, neck supple, no masses, no lymphadenopathy, thyroid nonpalpable.  Skin: Warm and dry, no rashes. Cardiac: Regular rate and rhythm, no murmurs rubs or gallops, no lower extremity edema.  Respiratory: Clear to auscultation bilaterally. Not using accessory muscles, speaking in full sentences. Back Exam:  Inspection: Unremarkable  Motion: Flexion 45 deg, Extension 45 deg, Side Bending to 45 deg bilaterally,  Rotation to 45 deg bilaterally  SLR laying: Negative  XSLR laying: Negative  Palpable tenderness: None. FABER: negative. Sensory change: Gross sensation intact to all lumbar and sacral dermatomes.  Reflexes: 2+ at both patellar tendons, 2+ at achilles tendons, Babinski's downgoing.  Strength at foot  Plantar-flexion: 5/5 Dorsi-flexion: 5/5 Eversion: 5/5 Inversion: 5/5  Leg strength  Quad: 5/5 Hamstring: 5/5 Hip flexor: 5/5 Hip abductors: 5/5  Gait unremarkable.  X-rays did show degenerative disc disease and facet arthritis.  Impression and Recommendations:

## 2014-01-20 NOTE — Assessment & Plan Note (Signed)
Approximately 50-60% better after steroids and muscle relaxers as well as home rehabilitation. She does desire to proceed with further conservative measures, we will refill her Flexeril which has been the most effective. Continue home exercises. Return to see me in one month, formal PT if no better. X-rays did show L4-5 disc disease at L5-1 and L4-5 facet arthrosis.

## 2014-01-28 ENCOUNTER — Other Ambulatory Visit: Payer: Self-pay | Admitting: Family Medicine

## 2014-02-18 ENCOUNTER — Ambulatory Visit (INDEPENDENT_AMBULATORY_CARE_PROVIDER_SITE_OTHER): Payer: BC Managed Care – PPO | Admitting: Sports Medicine

## 2014-02-18 ENCOUNTER — Encounter: Payer: Self-pay | Admitting: Sports Medicine

## 2014-02-18 VITALS — BP 139/91 | HR 115 | Ht 64.0 in | Wt 150.0 lb

## 2014-02-18 DIAGNOSIS — S335XXA Sprain of ligaments of lumbar spine, initial encounter: Secondary | ICD-10-CM

## 2014-02-18 DIAGNOSIS — S39012D Strain of muscle, fascia and tendon of lower back, subsequent encounter: Secondary | ICD-10-CM

## 2014-02-18 DIAGNOSIS — I10 Essential (primary) hypertension: Secondary | ICD-10-CM

## 2014-02-18 LAB — COMPREHENSIVE METABOLIC PANEL WITH GFR
Albumin: 4.7 g/dL (ref 3.5–5.2)
Alkaline Phosphatase: 98 U/L (ref 39–117)
BUN: 16 mg/dL (ref 6–23)
Potassium: 3.2 meq/L — ABNORMAL LOW (ref 3.5–5.3)
Sodium: 138 meq/L (ref 135–145)
Total Protein: 7.5 g/dL (ref 6.0–8.3)

## 2014-02-18 LAB — TSH: TSH: 1.162 u[IU]/mL (ref 0.350–4.500)

## 2014-02-18 LAB — COMPREHENSIVE METABOLIC PANEL
ALT: 18 U/L (ref 0–35)
AST: 24 U/L (ref 0–37)
CO2: 30 mEq/L (ref 19–32)
Calcium: 9.6 mg/dL (ref 8.4–10.5)
Chloride: 97 mEq/L (ref 96–112)
Creat: 0.95 mg/dL (ref 0.50–1.10)
Glucose, Bld: 107 mg/dL — ABNORMAL HIGH (ref 70–99)
Total Bilirubin: 0.5 mg/dL (ref 0.2–1.2)

## 2014-02-18 LAB — CBC
HCT: 42.8 % (ref 36.0–46.0)
Hemoglobin: 14.9 g/dL (ref 12.0–15.0)
MCH: 29.4 pg (ref 26.0–34.0)
MCHC: 34.8 g/dL (ref 30.0–36.0)
MCV: 84.6 fL (ref 78.0–100.0)
Platelets: 356 10*3/uL (ref 150–400)
RBC: 5.06 MIL/uL (ref 3.87–5.11)
RDW: 13.8 % (ref 11.5–15.5)
WBC: 9.3 10*3/uL (ref 4.0–10.5)

## 2014-02-18 MED ORDER — METOPROLOL SUCCINATE ER 25 MG PO TB24: 25.0000 mg | ORAL_TABLET | Freq: Every day | ORAL | Status: DC

## 2014-02-18 NOTE — Assessment & Plan Note (Addendum)
Starting low-dose metoprolol. 25 mg daily. Check TSH.  Slightly hypokalemic, starting potassium supplementation and may follow this up with PCP. Recheck in one week.

## 2014-02-18 NOTE — Assessment & Plan Note (Signed)
Resolved with Flexeril and conservative measures. Return as needed.

## 2014-02-18 NOTE — Progress Notes (Signed)
  Subjective:    CC: Followup  HPI: Lumbar strain: Occur after bowling, pain-free, off all medications, she is now back to bowling and very happy with results so far.  Past medical history, Surgical history, Family history not pertinant except as noted below, Social history, Allergies, and medications have been entered into the medical record, reviewed, and no changes needed.   Review of Systems: No fevers, chills, night sweats, weight loss, chest pain, or shortness of breath.   Objective:    General: Well Developed, well nourished, and in no acute distress.  Neuro: Alert and oriented x3, extra-ocular muscles intact, sensation grossly intact.  HEENT: Normocephalic, atraumatic, pupils equal round reactive to light, neck supple, no masses, no lymphadenopathy, thyroid nonpalpable.  Skin: Warm and dry, no rashes. Cardiac: Regular rate and rhythm, no murmurs rubs or gallops, no lower extremity edema.  Respiratory: Clear to auscultation bilaterally. Not using accessory muscles, speaking in full sentences. Back Exam:  Inspection: Unremarkable  Motion: Flexion 45 deg, Extension 45 deg, Side Bending to 45 deg bilaterally,  Rotation to 45 deg bilaterally  SLR laying: Negative  XSLR laying: Negative  Palpable tenderness: None. FABER: negative. Sensory change: Gross sensation intact to all lumbar and sacral dermatomes.  Reflexes: 2+ at both patellar tendons, 2+ at achilles tendons, Babinski's downgoing.  Strength at foot  Plantar-flexion: 5/5 Dorsi-flexion: 5/5 Eversion: 5/5 Inversion: 5/5  Leg strength  Quad: 5/5 Hamstring: 5/5 Hip flexor: 5/5 Hip abductors: 5/5  Gait unremarkable.  Impression and Recommendations:

## 2014-02-19 MED ORDER — POTASSIUM CHLORIDE CRYS ER 20 MEQ PO TBCR: 40.0000 meq | EXTENDED_RELEASE_TABLET | Freq: Every day | ORAL | Status: DC

## 2014-02-19 NOTE — Addendum Note (Signed)
Addended by: Monica BectonHEKKEKANDAM, THOMAS J on: 02/19/2014 07:10 PM   Modules accepted: Orders

## 2014-02-20 NOTE — Addendum Note (Signed)
 Addended by: Monica BectonHEKKEKANDAM,  J on: 02/20/2014 01:05 PM   Modules accepted: Orders

## 2014-02-23 ENCOUNTER — Ambulatory Visit (INDEPENDENT_AMBULATORY_CARE_PROVIDER_SITE_OTHER): Payer: BC Managed Care – PPO | Admitting: Family Medicine

## 2014-02-23 ENCOUNTER — Encounter: Payer: Self-pay | Admitting: Family Medicine

## 2014-02-23 ENCOUNTER — Telehealth: Payer: Self-pay | Admitting: *Deleted

## 2014-02-23 VITALS — BP 134/82 | HR 92 | Temp 97.7°F | Wt 151.0 lb

## 2014-02-23 DIAGNOSIS — I10 Essential (primary) hypertension: Secondary | ICD-10-CM

## 2014-02-23 DIAGNOSIS — R300 Dysuria: Secondary | ICD-10-CM | POA: Diagnosis not present

## 2014-02-23 LAB — POCT URINALYSIS DIPSTICK
Bilirubin, UA: NEGATIVE
Glucose, UA: NEGATIVE
KETONES UA: NEGATIVE
Nitrite, UA: NEGATIVE
PROTEIN UA: NEGATIVE
Spec Grav, UA: 1.015
Urobilinogen, UA: 0.2
pH, UA: 5

## 2014-02-23 MED ORDER — TRIAMTERENE-HCTZ 37.5-25 MG PO TABS: 1.0000 | ORAL_TABLET | Freq: Every day | ORAL | Status: DC

## 2014-02-23 MED ORDER — CIPROFLOXACIN HCL 250 MG PO TABS: ORAL_TABLET | ORAL | Status: AC

## 2014-02-23 MED ORDER — FLUCONAZOLE 150 MG PO TABS: 150.0000 mg | ORAL_TABLET | Freq: Once | ORAL | Status: DC

## 2014-02-23 NOTE — Telephone Encounter (Signed)
Pt called and left a message that she is having burning with urination and wanted to know if she needs an appt. I left a message on her vm that she does need an appt to be evaluated and submit a urine sample

## 2014-02-23 NOTE — Progress Notes (Signed)
CC: Lindsay Moore is a 58 y.o. female is here for Dysuria   Subjective: HPI:  Burning with urination for the past 2-3 days worsening on a daily basis. Moderate in severity. Accompanied by urinary urgency and frequency. She saw blood in her urine this morning. No interventions as of yet. Denies flank pain, abdominal pain, fevers, chills, nausea, back pain.  She wants to know if there is something she can take other than potassium supplementation. Blood pressures at home and normotensive. Continues on hydrochlorothiazide and metoprolol. No chest pain shortness of breath nor motor or sensory disturbances   Review Of Systems Outlined In HPI  Past Medical History  Diagnosis Date  . Essential hypertension, benign 06/10/2013  . Hyperlipidemia 06/10/2013  . 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 Right knee pain 06/10/2013    Workers Comp: [facility] Dr. Luiz Blare   . 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 Orthoscopic knee surgery  10/2012  . Gallbladder surgery    . Broken hip  2000  . Foot tendon surgery  2000  . Tubal ligation  1988  . Tonsillectomy  1959   Family History  Problem Relation Age of Onset  . Alcoholism Father   . Lung cancer Father   . Diabetes      grandmother  . Hyperlipidemia Mother   . Hypertension Mother   . Stroke      grandmother    History   Social History  . Marital Status: Married    Spouse Name: N/A    Number of Children: N/A  . Years of Education: N/A   Occupational History  . Not on file.   Social History Main Topics  . Smoking status: Former Games developer  . Smokeless tobacco: Not on file  . Alcohol Use: 0.5 - 1.0 oz/week    1-2 drink(s) per week  . Drug Use: No  . Sexual Activity: Yes    Partners: Male   Other Topics Concern  . Not on file   Social History Narrative  . No narrative on file     Objective: BP 134/82  Pulse 92  Temp(Src) 97.7 F (36.5 C) (Oral)  Wt 151 lb (68.493  kg)  General: Alert and Oriented, No Acute Distress HEENT: Pupils equal, round, reactive to light. Conjunctivae clear.  Moist mucous membranes pharynx unremarkable Lungs: Clear comfortable work of breathing Cardiac: Regular rate and rhythm.  Extremities: No peripheral edema.  Strong peripheral pulses.  Mental Status: No depression, anxiety, nor agitation. Skin: Warm and dry.  Assessment & Plan: Deryl was seen today for dysuria.  Diagnoses and associated orders for this visit:  Dysuria - Urinalysis Dipstick - Urine Culture - ciprofloxacin (CIPRO) 250 MG tablet; Take one by mouth twice a day for five days. - fluconazole (DIFLUCAN) 150 MG tablet; Take 1 tablet (150 mg total) by mouth once.  Essential hypertension, benign - triamterene-hydrochlorothiazide (MAXZIDE-25) 37.5-25 MG per tablet; Take 1 tablet by mouth daily. - BASIC METABOLIC PANEL WITH GFR    Dysuria: Start Cipro, following culture, fluconazole offered in case she gets symptoms of vaginal candidiasis which she states is extremely common after use of an antibiotic Essential hypertension: Controlled, changing formulation of hydrochlorothiazide so that she does not have to take potassium supplements, I've asked her to return for a basic metabolic panel one week after she makes the switch  25 minutes spent face-to-face during visit today of which at least  50% was counseling or coordinating care regarding: 1. Dysuria   2. Essential hypertension, benign      Return if symptoms worsen or fail to improve.

## 2014-02-26 LAB — URINE CULTURE: Colony Count: >=100000

## 2014-03-05 ENCOUNTER — Telehealth: Payer: Self-pay | Admitting: Family Medicine

## 2014-03-05 ENCOUNTER — Telehealth: Payer: Self-pay | Admitting: *Deleted

## 2014-03-05 DIAGNOSIS — I10 Essential (primary) hypertension: Secondary | ICD-10-CM

## 2014-03-05 DIAGNOSIS — N179 Acute kidney failure, unspecified: Secondary | ICD-10-CM

## 2014-03-05 LAB — BASIC METABOLIC PANEL WITH GFR
BUN: 20 mg/dL (ref 6–23)
CO2: 34 mEq/L — ABNORMAL HIGH (ref 19–32)
Calcium: 9.5 mg/dL (ref 8.4–10.5)
Chloride: 98 mEq/L (ref 96–112)
Creat: 1.37 mg/dL — ABNORMAL HIGH (ref 0.50–1.10)
GFR, EST AFRICAN AMERICAN: 49 mL/min — AB
GFR, EST NON AFRICAN AMERICAN: 42 mL/min — AB
GLUCOSE: 96 mg/dL (ref 70–99)
POTASSIUM: 4 meq/L (ref 3.5–5.3)
SODIUM: 137 meq/L (ref 135–145)

## 2014-03-05 NOTE — Telephone Encounter (Signed)
Pt wanted to know if she could wait a week and a half to have her labs recheck because she will be out of town. I told her that should be fine. She also wanted to know if the UTI she had had anything to do with the decline in her kidney funtion. I told her that I didn't think it would and that dr. Ivan Anchors felt she could have been dehydrated of the medication could have caused it. I advised to be sure to have labs checked once she returned from vacation

## 2014-03-05 NOTE — Telephone Encounter (Signed)
Sue Lush, Will you please let Lindsay Moore know that her kidney function dropped significantly since switching to the new formulation of hydrochlorothiazide.  This could have been due to relative dehydration or the medication is the culprit.  I'd recommend she have her kidney function rechecked in one week after she makes sure she's well hydrated and NOT fasting.  This will determine if the new medication should be stopped.  (Lab slip in your inbox)

## 2014-03-05 NOTE — Telephone Encounter (Signed)
Left message on vm; labs faxed  

## 2014-03-17 LAB — BASIC METABOLIC PANEL WITH GFR
BUN: 19 mg/dL (ref 6–23)
CHLORIDE: 99 meq/L (ref 96–112)
CO2: 29 mEq/L (ref 19–32)
Calcium: 9.9 mg/dL (ref 8.4–10.5)
Creat: 1.08 mg/dL (ref 0.50–1.10)
GFR, EST NON AFRICAN AMERICAN: 56 mL/min — AB
GFR, Est African American: 65 mL/min
Glucose, Bld: 95 mg/dL (ref 70–99)
POTASSIUM: 4.4 meq/L (ref 3.5–5.3)
SODIUM: 138 meq/L (ref 135–145)

## 2014-03-18 ENCOUNTER — Ambulatory Visit (INDEPENDENT_AMBULATORY_CARE_PROVIDER_SITE_OTHER): Payer: BC Managed Care – PPO | Admitting: Family Medicine

## 2014-03-18 ENCOUNTER — Encounter: Payer: Self-pay | Admitting: Family Medicine

## 2014-03-18 VITALS — BP 130/81 | HR 103 | Wt 150.0 lb

## 2014-03-18 DIAGNOSIS — I10 Essential (primary) hypertension: Secondary | ICD-10-CM | POA: Diagnosis not present

## 2014-03-18 DIAGNOSIS — Z23 Encounter for immunization: Secondary | ICD-10-CM

## 2014-03-18 DIAGNOSIS — S139XXA Sprain of joints and ligaments of unspecified parts of neck, initial encounter: Secondary | ICD-10-CM

## 2014-03-18 DIAGNOSIS — S161XXA Strain of muscle, fascia and tendon at neck level, initial encounter: Secondary | ICD-10-CM

## 2014-03-18 NOTE — Progress Notes (Signed)
CC: Lindsay Moore is a 58 y.o. female is here for Hypertension   Subjective: HPI:  Followup essential hypertension: She continues on metoprolol and triamterene-hydrochlorothiazide on a daily basis the only blood pressures report is from earlier today 130/85. She denies any side effects or intolerance. Denies chest pain shortness of breath orthopnea peripheral edema nor motor or sensory disturbances  Her only complaint today is neck discomfort is described as a stiffness when she turns to the right suddenly. It is localized at the base of the neck but not midline, and also in the right SCM muscle. This improved with rest sometimes with Tylenol nothingbetter or worse. She denies motor or sensory disturbance in the upper extremity nor radiation of her pain    Review Of Systems Outlined In HPI  Past Medical History  Diagnosis Date  . Essential hypertension, benign 06/10/2013  . Hyperlipidemia 06/10/2013  . 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 Right knee pain 06/10/2013    Workers Comp: [facility] Dr. Luiz Blare   . 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 Orthoscopic knee surgery  10/2012  . Gallbladder surgery    . Broken hip  2000  . Foot tendon surgery  2000  . Tubal ligation  1988  . Tonsillectomy  1959   Family History  Problem Relation Age of Onset  . Alcoholism Father   . Lung cancer Father   . Diabetes      grandmother  . Hyperlipidemia Mother   . Hypertension Mother   . Stroke      grandmother    History   Social History  . Marital Status: Married    Spouse Name: N/A    Number of Children: N/A  . Years of Education: N/A   Occupational History  . Not on file.   Social History Main Topics  . Smoking status: Former Games developer  . Smokeless tobacco: Not on file  . Alcohol Use: 0.5 - 1.0 oz/week    1-2 drink(s) per week  . Drug Use: No  . Sexual Activity: Yes    Partners: Male   Other Topics Concern  . Not on file    Social History Narrative  . No narrative on file     Objective: BP 130/81  Pulse 103  Wt 150 lb (68.04 kg)  General: Alert and Oriented, No Acute Distress HEENT: Pupils equal, round, reactive to light. Conjunctivae clear.  External ears unremarkable, canals clear with intact TMs with appropriate landmarks.  Middle ear appears open without effusion. Pink inferior turbinates.  Moist mucous membranes, pharynx without inflammation nor lesions.  Neck supple without palpable lymphadenopathy nor abnormal masses. Lungs: Clear comfortable work of breathing Cardiac: Regular rate and rhythm.  Neck: No midline spinous process tenderness in the cervical spine. Full range of motion and strength in all 4 planes of the cervical spine. Pain is reproduced with palpation of the right superior trapezius muscle which is mildly hypertonic. Negative Spurling's bilaterally Extremities: No peripheral edema.  Strong peripheral pulses. Full range of motion and strength of both upper extremities Mental Status: No depression, anxiety, nor agitation. Skin: Warm and dry.  Assessment & Plan: Lindsay Moore was seen today for hypertension.  Diagnoses and associated orders for this visit:  Essential hypertension, benign  Neck strain, initial encounter    Essential hypertension: Controlled continue hydrochlorothiazide - triamterene and metoprolol. Muscular neck strain: Discussed heat therapy and gentle range of motion exercises  to perform for the next week or daily basis.Marland Kitchen avoid nonsteroidal anti-inflammatories given renal insufficiency   Return in about 6 months (around 09/16/2014).

## 2014-03-18 NOTE — Addendum Note (Signed)
Addended by: Pixie Casino on: 03/18/2014 03:31 PM   Modules accepted: Orders

## 2014-03-19 ENCOUNTER — Ambulatory Visit: Payer: BC Managed Care – PPO | Admitting: Family Medicine

## 2014-04-30 ENCOUNTER — Other Ambulatory Visit: Payer: Self-pay

## 2014-04-30 DIAGNOSIS — Z1231 Encounter for screening mammogram for malignant neoplasm of breast: Secondary | ICD-10-CM

## 2014-05-20 ENCOUNTER — Other Ambulatory Visit: Payer: Self-pay | Admitting: Family Medicine

## 2014-06-02 ENCOUNTER — Ambulatory Visit
Admission: RE | Admit: 2014-06-02 | Discharge: 2014-06-02 | Disposition: A | Discharge status: Home / Self Care | Payer: BC Managed Care – PPO | Source: Ambulatory Visit

## 2014-06-02 ENCOUNTER — Other Ambulatory Visit: Payer: Self-pay

## 2014-06-02 DIAGNOSIS — Z1231 Encounter for screening mammogram for malignant neoplasm of breast: Secondary | ICD-10-CM

## 2014-06-11 ENCOUNTER — Other Ambulatory Visit: Payer: Self-pay | Admitting: Sports Medicine

## 2014-06-19 ENCOUNTER — Other Ambulatory Visit (HOSPITAL_COMMUNITY)
Admission: RE | Admit: 2014-06-19 | Discharge: 2014-06-19 | Disposition: A | Discharge status: Home / Self Care | Payer: BC Managed Care – PPO | Source: Ambulatory Visit | Attending: Family Medicine | Admitting: Family Medicine

## 2014-06-19 ENCOUNTER — Ambulatory Visit (INDEPENDENT_AMBULATORY_CARE_PROVIDER_SITE_OTHER): Payer: BC Managed Care – PPO | Admitting: Family Medicine

## 2014-06-19 ENCOUNTER — Encounter: Payer: Self-pay | Admitting: Family Medicine

## 2014-06-19 VITALS — BP 137/83 | HR 84 | Ht 64.0 in | Wt 151.0 lb

## 2014-06-19 DIAGNOSIS — Z0000 Encounter for general adult medical examination without abnormal findings: Secondary | ICD-10-CM | POA: Diagnosis not present

## 2014-06-19 DIAGNOSIS — L309 Dermatitis, unspecified: Secondary | ICD-10-CM | POA: Diagnosis not present

## 2014-06-19 DIAGNOSIS — Z8742 Personal history of other diseases of the female genital tract: Secondary | ICD-10-CM

## 2014-06-19 DIAGNOSIS — Z01419 Encounter for gynecological examination (general) (routine) without abnormal findings: Secondary | ICD-10-CM | POA: Insufficient documentation

## 2014-06-19 DIAGNOSIS — Z1151 Encounter for screening for human papillomavirus (HPV): Secondary | ICD-10-CM | POA: Insufficient documentation

## 2014-06-19 HISTORY — DX: Personal history of other diseases of the female genital tract: Z87.42

## 2014-06-19 LAB — LIPID PANEL
CHOLESTEROL: 224 mg/dL — AB (ref 0–200)
HDL: 76 mg/dL (ref >39)
LDL Cholesterol: 126 mg/dL — ABNORMAL HIGH (ref 0–99)
Total CHOL/HDL Ratio: 2.9 Ratio
Triglycerides: 112 mg/dL (ref <150)
VLDL: 22 mg/dL (ref 0–40)

## 2014-06-19 LAB — CBC
HEMATOCRIT: 44.5 % (ref 36.0–46.0)
HEMOGLOBIN: 14.7 g/dL (ref 12.0–15.0)
MCH: 29 pg (ref 26.0–34.0)
MCHC: 33 g/dL (ref 30.0–36.0)
MCV: 87.8 fL (ref 78.0–100.0)
MPV: 10.3 fL (ref 9.4–12.4)
Platelets: 316 10*3/uL (ref 150–400)
RBC: 5.07 MIL/uL (ref 3.87–5.11)
RDW: 13.4 % (ref 11.5–15.5)
WBC: 7.2 10*3/uL (ref 4.0–10.5)

## 2014-06-19 LAB — COMPLETE METABOLIC PANEL WITH GFR
ALK PHOS: 94 U/L (ref 39–117)
ALT: 18 U/L (ref 0–35)
AST: 23 U/L (ref 0–37)
Albumin: 4.5 g/dL (ref 3.5–5.2)
BILIRUBIN TOTAL: 0.5 mg/dL (ref 0.2–1.2)
BUN: 19 mg/dL (ref 6–23)
CO2: 28 mEq/L (ref 19–32)
Calcium: 9.8 mg/dL (ref 8.4–10.5)
Chloride: 101 mEq/L (ref 96–112)
Creat: 1.1 mg/dL (ref 0.50–1.10)
GFR, EST AFRICAN AMERICAN: 64 mL/min
GFR, EST NON AFRICAN AMERICAN: 55 mL/min — AB
GLUCOSE: 96 mg/dL (ref 70–99)
Potassium: 3.9 mEq/L (ref 3.5–5.3)
Sodium: 141 mEq/L (ref 135–145)
Total Protein: 7.3 g/dL (ref 6.0–8.3)

## 2014-06-19 MED ORDER — NORGESTIMATE-ETH ESTRADIOL 0.25-35 MG-MCG PO TABS: 1.0000 | ORAL_TABLET | Freq: Every day | ORAL | Status: DC

## 2014-06-19 MED ORDER — TRIAMCINOLONE ACETONIDE 0.1 % EX CREA: TOPICAL_CREAM | CUTANEOUS | Status: AC

## 2014-06-19 NOTE — Progress Notes (Signed)
CC: Lindsay HatchetChristina G Moore is a 58 y.o. female is here for Annual Exam   Subjective: HPI:  Colonoscopy:Repeat 06/2015 due Papsmear: Normal 05/2012, she tells me she has a history of abnormal Pap smear over 20 years ago that required cryotherapy. She is uncertain about any other specifics but tells me she has had normal Pap smears ever since. Mammogram: Normal Dec 1st 2015.   Influenza Vaccine: received this season Pneumovax: No current indication Td/Tdap: 01/31/2009 Zoster: (Start 58 yo)  Rare alcohol use no tobacco or recreational drug use  She's been taking Premarin for vaginal atrophy and dryness. Despite using this on a daily basis she still has painful intercourse and often bleeds due to vaginal tearing after intercourse with her husband. She still has a uterus and has not been on any progesterone. She wants anything she can take other than Premarin  Review of Systems - General ROS: negative for - chills, fever, night sweats, weight gain or weight loss Ophthalmic ROS: negative for - decreased vision Psychological ROS: negative for - anxiety or depression ENT ROS: negative for - hearing change, nasal congestion, tinnitus or allergies Hematological and Lymphatic ROS: negative for - bleeding problems, bruising or swollen lymph nodes Breast ROS: negative Respiratory ROS: no cough, shortness of breath, or wheezing Cardiovascular ROS: no chest pain or dyspnea on exertion Gastrointestinal ROS: no abdominal pain, change in bowel habits, or black or bloody stools Genito-Urinary ROS: negative for - genital discharge, genital ulcers, incontinence  Musculoskeletal ROS: negative for - joint pain or muscle pain Neurological ROS: negative for - headaches or memory loss Dermatological ROS: negative for lumps, mole changes, rash and skin lesion changes  Past Medical History  Diagnosis Date  . Essential hypertension, benign 06/10/2013  . Hyperlipidemia 06/10/2013  . 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 Right  knee pain 06/10/2013    Workers Comp: [facility] Dr. Luiz BlareGraves   . 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 Orthoscopic knee surgery  10/2012  . Gallbladder surgery    . Broken hip  2000  . Foot tendon surgery  2000  . Tubal ligation  1988  . Tonsillectomy  1959   Family History  Problem Relation Age of Onset  . Alcoholism Father   . Lung cancer Father   . Diabetes      grandmother  . Hyperlipidemia Mother   . Hypertension Mother   . Stroke      grandmother    History   Social History  . Marital Status: Married    Spouse Name: N/A    Number of Children: N/A  . Years of Education: N/A   Occupational History  . Not on file.   Social History Main Topics  . Smoking status: Former Games developermoker  . Smokeless tobacco: Not on file  . Alcohol Use: 0.5 - 1.0 oz/week    1-2 drink(s) per week  . Drug Use: No  . Sexual Activity:    Partners: Male   Other Topics Concern  . Not on file   Social History Narrative     Objective: BP 137/83 mmHg  Pulse 84  Ht 5\' 4"  (1.626 m)  Wt 151 lb (68.493 kg)  BMI 25.91 kg/m2  General: No Acute Distress HEENT: Atraumatic, normocephalic, conjunctivae normal without scleral icterus.  No nasal discharge, hearing grossly intact, TMs with good landmarks bilaterally with no middle ear abnormalities, posterior pharynx clear without oral lesions. Neck:  Supple, trachea midline, no cervical nor supraclavicular adenopathy. Pulmonary: Clear to auscultation bilaterally without wheezing, rhonchi, nor rales. Cardiac: Regular rate and rhythm.  No murmurs, rubs, nor gallops. No peripheral edema.  2+ peripheral pulses bilaterally. Abdomen: Bowel sounds normal.  No masses.  Non-tender without rebound.  Negative Murphy's sign. GU: Labia majora & minora without lesions however moderate atrophy.  Distal urethra unremarkable.  Vaginal wall integrity preserved without mucosal lesions however moderate atrophy and  dryness.  Cervix non-tender and without gross lesions nor discharge.  No adnexal tenderness or masses.   MSK: Grossly intact, no signs of weakness.  Full strength throughout upper and lower extremities.  Full ROM in upper and lower extremities.  No midline spinal tenderness. Neuro: Gait unremarkable, CN II-XII grossly intact.  C5-C6 Reflex 2/4 Bilaterally, L4 Reflex 2/4 Bilaterally.  Cerebellar function intact. Skin: No rashes. Eczematous changes of moderate severity on the elbows and in her ear canal Psych: Alert and oriented to person/place/time.  Thought process normal. No anxiety/depression.   Assessment & Plan: Lindsay Moore was seen today for annual exam.  Diagnoses and associated orders for this visit:  Annual physical exam - Lipid panel - COMPLETE METABOLIC PANEL WITH GFR - CBC - Cytology - PAP  Eczema - triamcinolone cream (KENALOG) 0.1 %; Apply to affected areas twice a day for up to two weeks, avoid face.  History of abnormal cervical Pap smear  Other Orders - norgestimate-ethinyl estradiol (SPRINTEC 28) 0.25-35 MG-MCG tablet; Take 1 tablet by mouth daily. When you reach the placebo pills throw away and start new package.   Healthy lifestyle interventions including but not limited to regular exercise, a healthy low fat diet, moderation of salt intake, the dangers of tobacco/alcohol/recreational drug use, nutrition supplementation, and accident avoidance were discussed with the patient and a handout was provided for future reference. Begin triamcinolone as needed for eczema on the ears and elbows I've advised her to stop taking Premarin, she still has a uterus and needs to be on progesterone if she is taking any estrogen component, she will begin taking Sprintec to see if this helps with her vaginal dryness and pain with intercourse. Advised that she still has continued vaginal bleeding will need to refer to OB/GYN.  Return in about 3 months (around 09/18/2014) for BP follow  up.

## 2014-06-19 NOTE — Patient Instructions (Signed)
Dr. Blayke Pinera's General Advice Following Your Complete Physical Exam  The Benefits of Regular Exercise: Unless you suffer from an uncontrolled cardiovascular condition, studies strongly suggest that regular exercise and physical activity will add to both the quality and length of your life.  The World Health Organization recommends 150 minutes of moderate intensity aerobic activity every week.  This is best split over 3-4 days a week, and can be as simple as a brisk walk for just over 35 minutes "most days of the week".  This type of exercise has been shown to lower LDL-Cholesterol, lower average blood sugars, lower blood pressure, lower cardiovascular disease risk, improve memory, and increase one's overall sense of wellbeing.  The addition of anaerobic (or "strength training") exercises offers additional benefits including but not limited to increased metabolism, prevention of osteoporosis, and improved overall cholesterol levels.  How Can I Strive For A Low-Fat Diet?: Current guidelines recommend that 25-35 percent of your daily energy (food) intake should come from fats.  One might ask how can this be achieved without having to dissect each meal on a daily basis?  Switch to skim or 1% milk instead of whole milk.  Focus on lean meats such as ground turkey, fresh fish, baked chicken, and lean cuts of beef as your source of dietary protein.  Limit saturated fat consumption to less than 10% of your daily caloric intake.  Limit trans fatty acid consumption primarily by limiting synthetic trans fats such as partially hydrogenated oils (Ex: fried fast foods).  Substitute olive or vegetable oil for solid fats where possible.  Moderation of Salt Intake: Provided you don't carry a diagnosis of congestive heart failure nor renal failure, I recommend a daily allowance of no more than 2300 mg of salt (sodium).  Keeping under this daily goal is associated with a decreased risk of cardiovascular events, creeping  above it can lead to elevated blood pressures and increases your risk of cardiovascular events.  Milligrams (mg) of salt is listed on all nutrition labels, and your daily intake can add up faster than you think.  Most canned and frozen dinners can pack in over half your daily salt allowance in one meal.    Lifestyle Health Risks: Certain lifestyle choices carry specific health risks.  As you may already know, tobacco use has been associated with increasing one's risk of cardiovascular disease, pulmonary disease, numerous cancers, among many other issues.  What you may not know is that there are medications and nicotine replacement strategies that can more than double your chances of successfully quitting.  I would be thrilled to help manage your quitting strategy if you currently use tobacco products.  When it comes to alcohol use, I've yet to find an "ideal" daily allowance.  Provided an individual does not have a medical condition that is exacerbated by alcohol consumption, general guidelines determine "safe drinking" as no more than two standard drinks for a man or no more than one standard drink for a female per day.  However, much debate still exists on whether any amount of alcohol consumption is technically "safe".  My general advice, keep alcohol consumption to a minimum for general health promotion.  If you or others believe that alcohol, tobacco, or recreational drug use is interfering with your life, I would be happy to provide confidential counseling regarding treatment options.  General "Over The Counter" Nutrition Advice: Postmenopausal women should aim for a daily calcium intake of 1200 mg, however a significant portion of this might already be   provided by diets including milk, yogurt, cheese, and other dairy products.  Vitamin D has been shown to help preserve bone density, prevent fatigue, and has even been shown to help reduce falls in the elderly.  Ensuring a daily intake of 800 Units of  Vitamin D is a good place to start to enjoy the above benefits, we can easily check your Vitamin D level to see if you'd potentially benefit from supplementation beyond 800 Units a day.  Folic Acid intake should be of particular concern to women of childbearing age.  Daily consumption of 400-800 mcg of Folic Acid is recommended to minimize the chance of spinal cord defects in a fetus should pregnancy occur.    For many adults, accidents still remain one of the most common culprits when it comes to cause of death.  Some of the simplest but most effective preventitive habits you can adopt include regular seatbelt use, proper helmet use, securing firearms, and regularly testing your smoke and carbon monoxide detectors.  Azuri Bozard B. Dayelin Balducci DO [facility] [address], [address] [place], [place] [address] Phone: [phone]  

## 2014-06-22 ENCOUNTER — Telehealth: Payer: Self-pay | Admitting: Family Medicine

## 2014-06-22 LAB — CYTOLOGY - PAP

## 2014-06-22 MED ORDER — NORGESTIMATE-ETH ESTRADIOL 0.25-35 MG-MCG PO TABS: 1.0000 | ORAL_TABLET | Freq: Every day | ORAL | Status: DC

## 2014-06-22 NOTE — Telephone Encounter (Signed)
Refill

## 2014-07-01 ENCOUNTER — Other Ambulatory Visit: Payer: Self-pay | Admitting: Family Medicine

## 2014-08-09 ENCOUNTER — Other Ambulatory Visit: Payer: Self-pay | Admitting: Family Medicine

## 2014-08-17 ENCOUNTER — Telehealth: Payer: Self-pay

## 2014-08-17 DIAGNOSIS — N950 Postmenopausal bleeding: Secondary | ICD-10-CM

## 2014-08-17 HISTORY — DX: Postmenopausal bleeding: N95.0

## 2014-08-17 NOTE — Telephone Encounter (Signed)
Lindsay Moore/Lindsay Moore, I would also recommend a pelvic ultrasound to rule out any anatomical abnormality in the uterus that 's causing her bleeding, order has been placed

## 2014-08-17 NOTE — Telephone Encounter (Signed)
Lindsay Moore left a message stating breast tenderness and light spotting. She did start birth control pills at the end of December. I returned call; advised patient it is normal to have these symptoms the first few months.

## 2014-08-18 NOTE — Telephone Encounter (Signed)
Pt.notified

## 2014-08-19 ENCOUNTER — Other Ambulatory Visit: Payer: Self-pay | Admitting: Family Medicine

## 2014-08-19 DIAGNOSIS — N950 Postmenopausal bleeding: Secondary | ICD-10-CM

## 2014-08-20 ENCOUNTER — Telehealth: Payer: Self-pay | Admitting: Family Medicine

## 2014-08-20 ENCOUNTER — Ambulatory Visit (INDEPENDENT_AMBULATORY_CARE_PROVIDER_SITE_OTHER): Payer: BC Managed Care – PPO

## 2014-08-20 DIAGNOSIS — R938 Abnormal findings on diagnostic imaging of other specified body structures: Secondary | ICD-10-CM

## 2014-08-20 DIAGNOSIS — N950 Postmenopausal bleeding: Secondary | ICD-10-CM

## 2014-08-20 NOTE — Telephone Encounter (Signed)
Pt.notified

## 2014-08-20 NOTE — Telephone Encounter (Signed)
Lindsay Moore, Will you please let patient know that the hormone pill she's taking is proving too much stimulation to the lining of her uterus, she'll need to stop taking this immediately and will need a endometrial biopsy in the near future to make sure there is no abnormal proliferation of this lining.  I've placed a referral to GYN and asked her to call me if not notified by next week.  I left a message on her machine but i would like to make sure we confirm that she received these recommendations.

## 2014-08-21 ENCOUNTER — Telehealth: Payer: Self-pay | Admitting: Family Medicine

## 2014-08-21 ENCOUNTER — Other Ambulatory Visit: Payer: BC Managed Care – PPO

## 2014-08-21 DIAGNOSIS — N950 Postmenopausal bleeding: Secondary | ICD-10-CM

## 2014-08-21 NOTE — Telephone Encounter (Signed)
Wrong referral

## 2014-08-27 ENCOUNTER — Encounter: Payer: Self-pay | Admitting: Obstetrics & Gynecology

## 2014-08-27 ENCOUNTER — Ambulatory Visit (INDEPENDENT_AMBULATORY_CARE_PROVIDER_SITE_OTHER): Payer: BC Managed Care – PPO | Admitting: Obstetrics & Gynecology

## 2014-08-27 VITALS — BP 172/99 | HR 99 | Temp 97.4°F | Resp 16 | Wt 153.0 lb

## 2014-08-27 DIAGNOSIS — N950 Postmenopausal bleeding: Secondary | ICD-10-CM

## 2014-08-27 NOTE — Progress Notes (Signed)
   Subjective:    Patient ID: Lindsay HatchetChristina G Moore, female    DOB: 10-04-1955, 59 y.o.   MRN: [id number]  HPI  Pt is a 59 yo female with post menopausal bleeding.  She was having vaginal dryness and pain due to atrophic vaginitis.  PCP place her on OCPs.  Pt began having vaginal bleeding.  The OCPs were stopped after the TVUS showed 13mm lining.  Pt has no other symptoms or episodes of PMB  Review of Systems  Constitutional: Negative.   Respiratory: Negative.   Cardiovascular: Negative.   Gastrointestinal: Negative.   Genitourinary: Positive for vaginal bleeding.  Psychiatric/Behavioral: Negative.        Objective:   Physical Exam  Constitutional: She is oriented to person, place, and time. She appears well-developed and well-nourished. No distress.  HENT:  Head: Normocephalic and atraumatic.  Eyes: Conjunctivae are normal.  Pulmonary/Chest: Effort normal.  Abdominal: Soft. She exhibits no distension and no mass. There is no tenderness. There is no rebound and no guarding.  Genitourinary:  Tanner V No urethral prolapse Labia minora fused to labia majora with some loss introitus opening due to fusion interiorly near clitoris.   Uterus-nml size and contour Adnexa-no masses, NT  Musculoskeletal: She exhibits no tenderness.  Neurological: She is alert and oriented to person, place, and time.  Skin: Skin is warm and dry.  Psychiatric:  tearful  Vitals reviewed.  Filed Vitals:   08/27/14 1450 08/27/14 1545  BP: 164/100 172/99  Pulse: 108 99  Temp: 97.4 F (36.3 C)   TempSrc: Oral   Resp: 16   Weight: 153 lb (69.4 kg)    ENDOMETRIAL BIOPSY     The indications for endometrial biopsy were reviewed.   Risks of the biopsy including cramping, bleeding, infection, uterine perforation, inadequate specimen and need for additional procedures  were discussed. The patient states she understands and agrees to undergo procedure today. Consent was signed. Time out was performed. Urine HCG not  necessary due to postmenopausal status and age. A sterile speculum was placed in the patient's vagina and the cervix was prepped with Betadine. A single-toothed tenaculum was placed on the anterior lip of the cervix to stabilize it. The 3 mm pipelle was introduced into the endometrial cavity without difficulty to a depth of 7 cm, and a small amount of tissue was obtained and sent to pathology. The instruments were removed from the patient's vagina. Minimal bleeding from the cervix was noted. The patient tolerated the procedure well. Routine post-procedure instructions were given to the patient. The patient will follow up to review the results and for further management.           Assessment & Plan:  59 yo female with PMB recently off OCPs and probably lichen sclerosis  1-endometrial biospy 2-Will discuss results, treatment, and vulvar concerns at f/u visit. 3-Needs to f/u with PCP for BP.  Likely take tomorrow when she is not upset about biopsy

## 2014-08-31 ENCOUNTER — Telehealth: Payer: Self-pay | Admitting: *Deleted

## 2014-08-31 NOTE — Telephone Encounter (Signed)
[place] from [employer] on te BP of pt [employer]ristina Moore.  Today it was 160/96.  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

## 2014-09-01 ENCOUNTER — Telehealth: Payer: Self-pay | Admitting: *Deleted

## 2014-09-01 ENCOUNTER — Telehealth: Payer: Self-pay | Admitting: Family Medicine

## 2014-09-01 DIAGNOSIS — N950 Postmenopausal bleeding: Secondary | ICD-10-CM

## 2014-09-01 MED ORDER — MEDROXYPROGESTERONE ACETATE 10 MG PO TABS: ORAL_TABLET | ORAL | Status: DC

## 2014-09-01 NOTE — Telephone Encounter (Signed)
Sue LushAndrea, Will you please let Ms. Lindsay Moore know that her endometrial biopsy did not show any abnormality that requires any further workup.  This is very reassuring and rules out anything serious other than the bleeding itself.  I saw that her BP has been elevated and I'd like her to f/u once she feels like she has some piece of mind knowing that no further workup on her bleeding is needed.

## 2014-09-01 NOTE — Telephone Encounter (Signed)
Pt notified of benign Endometrial biopsy results.  [facility]10 for 3 months.  Pt does have an appt on Thursday for BP check with Dr Ivan AnchorsHommel.

## 2014-09-01 NOTE — Telephone Encounter (Signed)
Pt.notified

## 2014-09-03 ENCOUNTER — Encounter: Payer: Self-pay | Admitting: Family Medicine

## 2014-09-03 ENCOUNTER — Ambulatory Visit (INDEPENDENT_AMBULATORY_CARE_PROVIDER_SITE_OTHER): Payer: BC Managed Care – PPO | Admitting: Family Medicine

## 2014-09-03 VITALS — BP 142/85 | HR 100 | Wt 148.0 lb

## 2014-09-03 DIAGNOSIS — Z86010 Personal history of colonic polyps: Secondary | ICD-10-CM

## 2014-09-03 DIAGNOSIS — F411 Generalized anxiety disorder: Secondary | ICD-10-CM

## 2014-09-03 DIAGNOSIS — I10 Essential (primary) hypertension: Secondary | ICD-10-CM | POA: Diagnosis not present

## 2014-09-03 MED ORDER — ALPRAZOLAM 0.25 MG PO TABS: 0.2500 mg | ORAL_TABLET | Freq: Every day | ORAL | Status: DC | PRN

## 2014-09-03 MED ORDER — METOPROLOL SUCCINATE ER 25 MG PO TB24: ORAL_TABLET | ORAL | Status: DC

## 2014-09-03 NOTE — Progress Notes (Signed)
CC: Lindsay Moore is a 59 y.o. female is here for Hypertension   Subjective: HPI:  Follow-up hypertension: Continues to take metoprolol and Maxzide on a daily basis and no missed doses. She's noticed that blood pressures at home arranged between prehypertensive and stage I hypertension. She's had other outside blood pressures in the stage II range. She's noticed that blood pressure significantly linked to being elevated when she is experiencing. Severe extreme stress. 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 She denies chest pain shortness of breath orthopnea peripheral edema. She's counseling worried about everything and anything. Symptoms seem to fluctuate throughout the day between absent and severe.  She denies any depression or any mental disturbance other than anxiety.   Review Of Systems Outlined In HPI  Past Medical History  Diagnosis Date  . Essential hypertension, benign 06/10/2013  . Hyperlipidemia 06/10/2013  . 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 Right knee pain 06/10/2013    Workers Comp: [facility] Dr. Luiz BlareGraves   . 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 Orthoscopic knee surgery  10/2012  . Gallbladder surgery    . Broken hip  2000  . Foot tendon surgery  2000  . Tubal ligation  1988  . Tonsillectomy  1959   Family History  Problem Relation Age of Onset  . Alcoholism Father   . Lung cancer Father   . Diabetes      grandmother  . Hyperlipidemia Mother   . Hypertension Mother   . Stroke      grandmother    History   Social History  . Marital Status: Married    Spouse Name: N/A  . Number of Children: N/A  . Years of Education: N/A   Occupational History  . teacher    Social History Main Topics  . Smoking status: Former Smoker -- 1.00 packs/day for 25 years    Types: Cigarettes  . Smokeless tobacco: Never Used  . Alcohol Use: 0.6 - 1.2 oz/week    1-2 Standard drinks or equivalent per week  . Drug Use: No  . Sexual Activity:    Partners: Male    Birth Control/ Protection: Surgical   Other Topics Concern  . Not on file   Social History Narrative     Objective: BP 142/85 mmHg  Pulse 100  Wt 148 lb (67.132 kg)  Vital signs reviewed. General: Alert and Oriented, No Acute Distress HEENT: Pupils equal, round, reactive to light. Conjunctivae clear.  External ears unremarkable.  Moist mucous membranes. Lungs: Clear and comfortable work of breathing, speaking in full sentences without accessory muscle use. Cardiac: Regular rate and rhythm.  Neuro: CN II-XII grossly intact, gait normal. Extremities: No peripheral edema.  Strong peripheral pulses.  Mental Status: no depression nor agitation. Moderate anxiety and tearfulness when talking about her employer and recent postmenopausal bleeding workup Logical though process. Skin: Warm and dry.  Assessment & Plan: [name] was seen today for hypertension.  Diagnoses and all orders for this visit:  Essential hypertension, benign  Anxiety state Orders: -     ALPRAZolam (XANAX) 0.25 MG tablet; Take 1 tablet (0.25 mg total) by mouth daily as needed for anxiety.  History of colonic polyps Orders: -     Ambulatory referral to Gastroenterology  Other orders -  metoprolol succinate (TOPROL-XL) 25 MG 24 hr tablet; TAKE ONE TABLET BY MOUTH ONCE DAILY WITH OR IMMEDIATELY FOLLOWING A MEAL.   Essential hypertension: Encouraged her toIncrease her Maxide, she tells me she would like to first see if she can get her anxiety under control because she believes this is the only cause her uncontrolled hypertension. I discussed all give her one week to test out this very, provided with Xanax that she is use in the past with the joint understanding that if she begins using this on a daily basis she'll need  to start on something like Effexor or Lexapro to reduce her anxiety.. She'll check her blood pressure on a daily basis for the next week drop this off and recommendations will follow.  She was notified that she is now due for repeat colonoscopy for follow-up of a polyp removal 10 years ago and needs a referral  25 minutes spent face-to-face during visit today of which at least 50% was counseling or coordinating care regarding: 1. Essential hypertension, benign   2. Anxiety state   3. History of colonic polyps       Return in about 3 months (around 12/04/2014) for Blood Pressure.

## 2014-09-03 NOTE — Patient Instructions (Signed)
Write blood pressure values below:                       .

## 2014-09-08 ENCOUNTER — Telehealth: Payer: Self-pay | Admitting: *Deleted

## 2014-09-08 NOTE — Telephone Encounter (Signed)
LM on voicemail of her need to make appt with Dr Penne LashLeggett for possible  labial biopsy to check for Lichen Scerosus.  She is aware that her endometrial biopsy was neg.

## 2014-09-15 ENCOUNTER — Telehealth: Payer: Self-pay | Admitting: Family Medicine

## 2014-09-15 DIAGNOSIS — I10 Essential (primary) hypertension: Secondary | ICD-10-CM

## 2014-09-15 MED ORDER — DIAZEPAM 5 MG PO TABS: 5.0000 mg | ORAL_TABLET | Freq: Every day | ORAL | Status: DC | PRN

## 2014-09-15 MED ORDER — TRIAMTERENE-HCTZ 37.5-25 MG PO TABS: 1.0000 | ORAL_TABLET | Freq: Every day | ORAL | Status: DC

## 2014-09-15 MED ORDER — METOPROLOL SUCCINATE ER 25 MG PO TB24: ORAL_TABLET | ORAL | Status: DC

## 2014-09-15 NOTE — Telephone Encounter (Signed)
Pt.notified

## 2014-09-15 NOTE — Telephone Encounter (Signed)
Sue LushAndrea, Will you please thank Ms. Cantrell for dropping off her blood pressure journal.  Her numbers  Look great and I don't think there's any need to change her blood pressure medication regimen.  I'm going to shred the alprazolam Rx she dropped off and there's a valium rx in your inbox if she wants it.

## 2014-10-13 ENCOUNTER — Ambulatory Visit (INDEPENDENT_AMBULATORY_CARE_PROVIDER_SITE_OTHER): Payer: BC Managed Care – PPO | Admitting: Obstetrics & Gynecology

## 2014-10-13 ENCOUNTER — Encounter: Payer: Self-pay | Admitting: Obstetrics & Gynecology

## 2014-10-13 VITALS — BP 154/97 | HR 104 | Resp 16 | Ht 64.5 in | Wt 148.0 lb

## 2014-10-13 DIAGNOSIS — L989 Disorder of the skin and subcutaneous tissue, unspecified: Secondary | ICD-10-CM

## 2014-10-13 DIAGNOSIS — N9489 Other specified conditions associated with female genital organs and menstrual cycle: Secondary | ICD-10-CM

## 2014-10-13 DIAGNOSIS — N952 Postmenopausal atrophic vaginitis: Secondary | ICD-10-CM

## 2014-10-13 NOTE — Progress Notes (Signed)
59 yo female presents for labial biopsy.  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  Patient also has symptoms and physical characteristics of atrophic vaginitis.    VULVAR BIOPSY NOTE  The indications for vulvar biopsy (rule out neoplasia, establish lichen sclerosus diagnosis) were reviewed.   Risks of the biopsy including pain, bleeding, infection, inadequate specimen, scarring and need for additional procedures  were discussed. The patient stated understanding and agreed to undergo procedure today. Consent was signed,  time out performed.  The patient's vulva was prepped with Betadine. 1% lidocaine was injected into 6 o'clock and right labia minora around 9 o'clock.  A 3-mm punch biopsy was done at both sites, biopsy tissue was picked up with sterile forceps and sterile scissors were used to excise the lesion.  Small bleeding was noted and hemostasis was achieved using silver nitrate sticks.  The patient tolerated the procedure well. Post-procedure instructions  (pelvic rest for one week) were given to the patient. The patient is to call with heavy bleeding, fever greater than 100.4, foul smelling vaginal discharge or other concerns. The patient will be return to clinic in two weeks for discussion of results.   Patient needs to see primary care MD regarding HTN.

## 2014-10-13 NOTE — Patient Instructions (Signed)

## 2014-10-19 ENCOUNTER — Telehealth: Payer: Self-pay | Admitting: *Deleted

## 2014-10-19 NOTE — Telephone Encounter (Signed)
LM on voicemail that her pathology report is back but Dr Penne LashLeggett would like Dr Rhona LeavensKisch to review the slides again.  She is reassured that this is not cancer.  I will call her when the review is complete.

## 2014-10-26 ENCOUNTER — Ambulatory Visit: Payer: BC Managed Care – PPO | Admitting: Obstetrics & Gynecology

## 2014-10-26 ENCOUNTER — Telehealth: Payer: Self-pay | Admitting: *Deleted

## 2014-10-26 DIAGNOSIS — N9489 Other specified conditions associated with female genital organs and menstrual cycle: Secondary | ICD-10-CM

## 2014-10-26 MED ORDER — CLOBETASOL PROPIONATE 0.05 % EX OINT: 1.0000 "application " | TOPICAL_OINTMENT | Freq: Two times a day (BID) | CUTANEOUS | Status: DC

## 2014-10-26 NOTE — Telephone Encounter (Signed)
-----   Message from Lesly DukesKelly H Leggett, MD sent at 10/25/2014  8:00 AM EDT ----- Will treat with Temovate; if no improvement will do colposcopy and re biopsy.

## 2014-10-26 NOTE — Telephone Encounter (Signed)
Called pt to adv will call in Temovate to pharmacy and if Sx are no better then we will do colpo again and re-biopsy. Adv pt that she could cancel appt for today unless she thinks she needs to still come in.

## 2014-12-25 ENCOUNTER — Ambulatory Visit (INDEPENDENT_AMBULATORY_CARE_PROVIDER_SITE_OTHER): Payer: BC Managed Care – PPO | Admitting: Family Medicine

## 2014-12-25 ENCOUNTER — Encounter: Payer: Self-pay | Admitting: Family Medicine

## 2014-12-25 VITALS — BP 130/82 | HR 102 | Temp 98.1°F | Wt 149.0 lb

## 2014-12-25 DIAGNOSIS — H6692 Otitis media, unspecified, left ear: Secondary | ICD-10-CM

## 2014-12-25 MED ORDER — METHYLPREDNISOLONE ACETATE 80 MG/ML IJ SUSP
80.0000 mg | Freq: Once | INTRAMUSCULAR | Status: AC
  Administered 2014-12-25: 80 mg via INTRAMUSCULAR

## 2014-12-25 MED ORDER — AMOXICILLIN-POT CLAVULANATE 500-125 MG PO TABS: ORAL_TABLET | ORAL | Status: AC

## 2014-12-25 NOTE — Addendum Note (Signed)
Addended by: Wyline Beady on: 12/25/2014 11:38 AM   Modules accepted: Orders

## 2014-12-25 NOTE — Progress Notes (Signed)
CC: Lindsay Moore is a 59 y.o. female is here for left ear pain   Subjective: HPI:  Left ear pain that began This morning. Described as pressure and pain. Moderate in severity. Nothing particularly makes it better or worse. No interventions as of yet. She's also been experiencing nasal congestion, postnasal drip and facial pressure since Monday of this week. She's tried Tylenol but nobenefit for pain. Denies fevers, chills, cough, sore throat, wheezing, shortness of breath. Denies hearing loss or headache.    Review Of Systems Outlined In HPI  Past Medical History  Diagnosis Date  . Essential hypertension, benign 06/10/2013  . Hyperlipidemia 06/10/2013  . 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 Right knee pain 06/10/2013    Workers Comp: [facility] Dr. Luiz Blare   . 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 Orthoscopic knee surgery  10/2012  . Gallbladder surgery    . Broken hip  2000  . Foot tendon surgery  2000  . Tubal ligation  1988  . Tonsillectomy  1959   Family History  Problem Relation Age of Onset  . Alcoholism Father   . Lung cancer Father   . Diabetes      grandmother  . Hyperlipidemia Mother   . Hypertension Mother   . Stroke      grandmother    History   Social History  . Marital Status: Married    Spouse Name: N/A  . Number of Children: N/A  . Years of Education: N/A   Occupational History  . teacher    Social History Main Topics  . Smoking status: Former Smoker -- 1.00 packs/day for 25 years    Types: Cigarettes  . Smokeless tobacco: Never Used  . Alcohol Use: 0.6 - 1.2 oz/week    1-2 Standard drinks or equivalent per week  . Drug Use: No  . Sexual Activity:    Partners: Male    Birth Control/ Protection: Surgical   Other Topics Concern  . Not on file   Social History Narrative     Objective: BP 130/82 mmHg  Pulse 102  Temp(Src) 98.1 F (36.7 C) (Oral)  Wt 149 lb (67.586 kg)  General:  Alert and Oriented, No Acute Distress HEENT: Pupils equal, round, reactive to light. Conjunctivae clear.  External ears unremarkable, canals clear with intact tympanic membrane bilaterally. Poor landmarks on the left tympanic membrane..Right middle ear is open without effusion however left middle ear has a serous effusion with bulging tympanic membrane..pink inferior turbinates.  Moist mucous membranes, pharynx without inflammation nor lesions.  Neck supple without palpable lymphadenopathy nor abnormal masses. Lungs: Clear to auscultation bilaterally, no wheezing/ronchi/rales.  Comfortable work of breathing. Good air movement. Extremities: No peripheral edema.  Strong peripheral pulses.  Mental Status: No depression, anxiety, nor agitation. Skin: Warm and dry.  Assessment & Plan: Fe was seen today for left ear pain.  Diagnoses and all orders for this visit:  Acute left otitis media, recurrence not specified, unspecified otitis media type Orders: -     amoxicillin-clavulanate (AUGMENTIN) 500-125 MG per tablet; Take one by mouth every 8 hours for ten total days.   Otitis media: Start Augmentin, I offered her prednisone however she wants another something that she could take such as an injection instead. For  Her request she will be given Depo-Medrol.   Return if symptoms worsen or fail to improve.

## 2015-01-05 ENCOUNTER — Telehealth: Payer: Self-pay | Admitting: *Deleted

## 2015-01-05 DIAGNOSIS — H6500 Acute serous otitis media, unspecified ear: Secondary | ICD-10-CM

## 2015-01-05 NOTE — Telephone Encounter (Signed)
Pt was seen last week for an ear infection. Pt was to call today if her ears still felt " blocked." Pt left a message stating that her ears feel like she has "fluid in them" and wants to know what else she can do. Prednisone was offered but pt declined and wanted an injection instead. Please advise

## 2015-01-06 DIAGNOSIS — H6590 Unspecified nonsuppurative otitis media, unspecified ear: Secondary | ICD-10-CM | POA: Insufficient documentation

## 2015-01-06 HISTORY — DX: Unspecified nonsuppurative otitis media, unspecified ear: H65.90

## 2015-01-06 MED ORDER — PHENYLEPHRINE HCL 10 MG PO TABS: 10.0000 mg | ORAL_TABLET | Freq: Three times a day (TID) | ORAL | Status: DC

## 2015-01-06 MED ORDER — AZITHROMYCIN 250 MG PO TABS: ORAL_TABLET | ORAL | Status: DC

## 2015-01-06 MED ORDER — FLUTICASONE PROPIONATE 50 MCG/ACT NA SUSP: NASAL | Status: DC

## 2015-01-06 NOTE — Telephone Encounter (Signed)
Sounds more like eustachian tube dysfunction, she needs to clear the eustachian tube to drain the effusion, I will add azithromycin, she needs to do Flonase and decongestants, phenylephrine 10 mg twice a day.

## 2015-01-06 NOTE — Telephone Encounter (Signed)
Left message on vm

## 2015-01-06 NOTE — Assessment & Plan Note (Signed)
Adding azithromycin, Flonase, phenylephrine. This is likely eustachian tube dysfunction with resultant middle ear effusion.

## 2015-05-02 ENCOUNTER — Other Ambulatory Visit: Payer: Self-pay | Admitting: Family Medicine

## 2015-05-13 ENCOUNTER — Ambulatory Visit (INDEPENDENT_AMBULATORY_CARE_PROVIDER_SITE_OTHER): Payer: BC Managed Care – PPO | Admitting: Family Medicine

## 2015-05-13 DIAGNOSIS — Z23 Encounter for immunization: Secondary | ICD-10-CM | POA: Diagnosis not present

## 2015-05-17 ENCOUNTER — Other Ambulatory Visit: Payer: Self-pay

## 2015-05-17 DIAGNOSIS — Z1231 Encounter for screening mammogram for malignant neoplasm of breast: Secondary | ICD-10-CM

## 2015-05-30 ENCOUNTER — Other Ambulatory Visit: Payer: Self-pay | Admitting: Family Medicine

## 2015-06-01 ENCOUNTER — Encounter: Payer: BC Managed Care – PPO | Admitting: Family Medicine

## 2015-06-02 ENCOUNTER — Ambulatory Visit (INDEPENDENT_AMBULATORY_CARE_PROVIDER_SITE_OTHER): Payer: BC Managed Care – PPO | Admitting: Family Medicine

## 2015-06-02 ENCOUNTER — Encounter: Payer: Self-pay | Admitting: Family Medicine

## 2015-06-02 VITALS — BP 140/90 | HR 105 | Wt 147.0 lb

## 2015-06-02 DIAGNOSIS — I10 Essential (primary) hypertension: Secondary | ICD-10-CM

## 2015-06-02 DIAGNOSIS — M25511 Pain in right shoulder: Secondary | ICD-10-CM

## 2015-06-02 MED ORDER — METOPROLOL SUCCINATE ER 25 MG PO TB24: ORAL_TABLET | ORAL | Status: DC

## 2015-06-02 MED ORDER — TRIAMTERENE-HCTZ 37.5-25 MG PO TABS: 1.0000 | ORAL_TABLET | Freq: Every day | ORAL | Status: DC

## 2015-06-02 NOTE — Progress Notes (Signed)
CC: Lindsay Moore is a 59 y.o. female is here for Hypertension and Medication Refill   Subjective: HPI:  Follow-up essential hypertension: continues to take metoprolol and triamterene hydrochlorothiazide daily with 100% complaints. Denies chest pain shortness of breath orthopnea nor peripheral edema. Blood pressures at home have been in the normotensive or prehypertensive range.  Complains of right shoulder pain it's localized in the lateral aspect of the shoulder and is near the triceps region. Sometimes it radiates into the armpit. It's worse with any supination of the arm. Worse when playing bowling. Nothing else seems to make it better or worse. It's absent if she staying still. She denies any recent or remote trauma or overexertion. She denies any swelling redness or warmth of the shoulder itself. No motor or sensory disturbances in the right upper extremity   Review Of Systems Outlined In HPI  Past Medical History  Diagnosis Date  . Essential hypertension, benign 06/10/2013  . Hyperlipidemia 06/10/2013  . 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 Right knee pain 06/10/2013    Workers Comp: [facility] Dr. Luiz Blare   . 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 Orthoscopic knee surgery  10/2012  . Gallbladder surgery    . Broken hip  2000  . Foot tendon surgery  2000  . Tubal ligation  1988  . Tonsillectomy  1959   Family History  Problem Relation Age of Onset  . Alcoholism Father   . Lung cancer Father   . Diabetes      grandmother  . Hyperlipidemia Mother   . Hypertension Mother   . Stroke      grandmother    Social History   Social History  . Marital Status: Married    Spouse Name: N/A  . Number of Children: N/A  . Years of Education: N/A   Occupational History  . teacher    Social History Main Topics  . Smoking status: Former Smoker -- 1.00 packs/day for 25 years    Types: Cigarettes  . Smokeless tobacco: Never  Used  . Alcohol Use: 0.6 - 1.2 oz/week    1-2 Standard drinks or equivalent per week  . Drug Use: No  . Sexual Activity:    Partners: Male    Birth Control/ Protection: Surgical   Other Topics Concern  . Not on file   Social History Narrative     Objective: BP 140/90 mmHg  Pulse 105  Wt 147 lb (66.679 kg)  General: Alert and Oriented, No Acute Distress HEENT: Pupils equal, round, reactive to light. Conjunctivae clear.  Moist mucous membranes Lungs: Clear to auscultation bilaterally, no wheezing/ronchi/rales.  Comfortable work of breathing. Good air movement. Cardiac: Regular rate and rhythm. Normal S1/S2.  No murmurs, rubs, nor gallops.   Right shoulder exam reveals full range of motion and strength in all planes of motion and with individual rotator cuff testing. No overlying redness warmth or swelling.  Neer's test negative.  Hawkins test negative. Empty can negative. Crossarm test negative. O'Brien's test negative. Apprehension test negative. Speed's test positive. Extremities: No peripheral edema.  Strong peripheral pulses.  Mental Status: No depression, anxiety, nor agitation. Skin: Warm and dry.  Assessment & Plan: Arion was seen today for hypertension and medication refill.  Diagnoses and all orders for this visit:  Essential hypertension, benign  Right shoulder pain  Other orders -     triamterene-hydrochlorothiazide (MAXZIDE-25) 37.5-25 MG tablet; Take  1 tablet by mouth daily. -     metoprolol succinate (TOPROL-XL) 25 MG 24 hr tablet; TAKE ONE TABLET BY MOUTH ONCE DAILY WITH OR IMMEDIATELY FOLLOWING A MEAL.   Essential hypertension: Uncontrolled chronic condition, given that she is just slightly elevated today I think she can achieve better blood pressures with reducing sodium in her diet. Continue above medications with no changes Right shoulder pain is most likely due to bicipital tendinitis, she was given a home rehabilitation plan to perform on a daily  basis for the next 3 weeks. I've asked her to call me if no better after 3 weeks so I can get her in to sports medicine.  Return in about 4 weeks (around 06/30/2015) for physical.

## 2015-06-04 ENCOUNTER — Ambulatory Visit
Admission: RE | Admit: 2015-06-04 | Discharge: 2015-06-04 | Disposition: A | Discharge status: Home / Self Care | Payer: BC Managed Care – PPO | Source: Ambulatory Visit

## 2015-06-04 DIAGNOSIS — Z1231 Encounter for screening mammogram for malignant neoplasm of breast: Secondary | ICD-10-CM

## 2015-06-21 ENCOUNTER — Telehealth: Payer: Self-pay

## 2015-06-21 NOTE — Telephone Encounter (Signed)
After trying exercises for the past 3 weeks for shoulder/arm pain she has not seen any improvements.

## 2015-06-21 NOTE — Telephone Encounter (Signed)
Pt.notified

## 2015-06-21 NOTE — Telephone Encounter (Signed)
Will you let her know that I'd recommend seeing either Dr. Karie Schwalbe or Dr. Denyse Amassorey, can you transfer her up front to schedule this?

## 2015-06-22 ENCOUNTER — Ambulatory Visit (INDEPENDENT_AMBULATORY_CARE_PROVIDER_SITE_OTHER): Payer: BC Managed Care – PPO | Admitting: Sports Medicine

## 2015-06-22 VITALS — BP 139/83 | HR 93 | Resp 18 | Wt 152.7 lb

## 2015-06-22 DIAGNOSIS — M7521 Bicipital tendinitis, right shoulder: Secondary | ICD-10-CM

## 2015-06-22 HISTORY — DX: Bicipital tendinitis, right shoulder: M75.21

## 2015-06-22 NOTE — Progress Notes (Signed)
   Subjective:    I'm seeing this patient as a consultation for:  Dr. 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 No radicular pain.  Oral NSAIDs of been ineffective.  Past medical history, Surgical history, Family history not pertinant except as noted below, Social history, Allergies, and medications have been entered into the medical record, reviewed, and no changes needed.   Review of Systems: No headache, visual changes, nausea, vomiting, diarrhea, constipation, dizziness, abdominal pain, skin rash, fevers, chills, night sweats, weight loss, swollen lymph nodes, body aches, joint swelling, muscle aches, chest pain, shortness of breath, mood changes, visual or auditory hallucinations.   Objective:   General: Well Developed, well nourished, and in no acute distress.  Neuro/Psych: Alert and oriented x3, extra-ocular muscles intact, able to move all 4 extremities, sensation grossly intact. Skin: Warm and dry, no rashes noted.  Respiratory: Not using accessory muscles, speaking in full sentences, trachea midline.  Cardiovascular: Pulses palpable, no extremity edema. Abdomen: Does not appear distended. Right Shoulder: Inspection reveals no abnormalities, atrophy or asymmetry. Palpation is normal with no tenderness over AC joint or bicipital groove. ROM is full in all planes. Rotator cuff strength normal throughout. No signs of impingement with negative Neer and Hawkin's tests, empty can. Speeds and Yergason's tests both strongly positive. No labral pathology noted with negative Obrien's, negative crank, negative clunk, and good stability. Normal scapular function observed. No painful arc and no drop arm  sign. No apprehension sign  Procedure: Real-time Ultrasound Guided Injection of  Right biceps tendon sheath Device: GE Logiq E  Verbal informed consent obtained.  Time-out conducted.  Noted no overlying erythema, induration, or other signs of local infection.  Skin prepped in a sterile fashion.  Local anesthesia: Topical Ethyl chloride.  With sterile technique and under real time ultrasound guidance:  25-gauge needle advanced into the bicipital groove, taking care to avoid intratendinous injection I injected 1 mL kenalog 40, 2 mL lidocaine, 2 mL Marcaine easily. Completed without difficulty  Pain immediately resolved suggesting accurate placement of the medication.  Advised to call if fevers/chills, erythema, induration, drainage, or persistent bleeding.  Images permanently stored and available for review in the ultrasound unit.  Impression: Technically successful ultrasound guided injection.  Impression and Recommendations:   This case required medical decision making of moderate complexity.

## 2015-06-22 NOTE — Assessment & Plan Note (Signed)
Present for almost a month now, bicipital tendon sheath injection, aggressive formal physical therapy. Avoid bowling for the next 2 weeks.

## 2015-06-25 ENCOUNTER — Ambulatory Visit (INDEPENDENT_AMBULATORY_CARE_PROVIDER_SITE_OTHER): Payer: BC Managed Care – PPO | Admitting: Physical Therapy

## 2015-06-25 ENCOUNTER — Encounter: Payer: Self-pay | Admitting: Physical Therapy

## 2015-06-25 DIAGNOSIS — R29898 Other symptoms and signs involving the musculoskeletal system: Secondary | ICD-10-CM

## 2015-06-25 DIAGNOSIS — M25511 Pain in right shoulder: Secondary | ICD-10-CM | POA: Diagnosis not present

## 2015-06-25 DIAGNOSIS — M25611 Stiffness of right shoulder, not elsewhere classified: Secondary | ICD-10-CM

## 2015-06-25 NOTE — Patient Instructions (Addendum)
Scapula Adduction With Pectorals, Low - ARMS STRAIGHT HANDS DOWN LOW   [place] [phone]   Stand in doorframe with palms against frame and arms at 45. Lean forward and squeeze shoulder blades. Hold _30__ seconds. Don't push into pain, only for gentle stretch.  Repeat _2__ times per session. Do _2__ sessions per day.      Strengthening: Isometric External Rotation    Using wall to provide resistance, and keeping right arm at side, press back of hand into ball using light pressure. Hold _5-10___ seconds. Repeat __10__ times per set. Do __1__ sets per session. Do ___1_ sessions per day.  Strengthening: Isometric Extension    Using wall for resistance, press back of left arm into ball using light pressure. Hold _5-10___ seconds. Repeat _10___ times per set. Do __1__ sets per session. Do __1__ sessions per day.  Strengthening: Isometric Internal Rotation    Using door frame for resistance, press palm of right hand into ball using light pressure. Keep elbow in at side. Hold _5-10___ seconds. Repeat _10___ times per set. Do __1__ sets per session. Do __1__ sessions per day.   Scapular Retraction (Standing)    With arms at sides, pinch shoulder blades together. Repeat __10__ times per set. Do __1__ sets per session. Do _4-5___ sessions per day.    Start on Monday 12/26 continue with these if they aren't painful.   Over Head Pull: Narrow Grip     [place] [phone]   On back, knees bent, feet flat, band across thighs, elbows straight but relaxed. Pull hands apart (start). Keeping elbows straight, bring arms up and over head, hands toward floor. Keep pull steady on band. Hold momentarily. Return slowly, keeping pull steady, back to start. Repeat _10-20__ times. Band color _red_____   Side Pull: Double Arm   On back, knees bent, feet flat. Arms perpendicular to body, shoulder level, elbows straight but relaxed. Pull arms out to sides, elbows straight. Resistance band comes  across collarbones, hands toward floor. Hold momentarily. Slowly return to starting position. Repeat _10-20__ times. Band color __red___   Elisabeth CaraSash   On back, knees bent, feet flat, left hand on left hip, right hand above left. Pull right arm DIAGONALLY (hip to shoulder) across chest. Bring right arm along head toward floor. Hold momentarily. Slowly return to starting position. Repeat _10-20__ times. Do with left arm. Band color __red____   Shoulder Rotation: Double Arm   On back, knees bent, feet flat, elbows tucked at sides, bent 90, hands palms up. Pull hands apart and down toward floor, keeping elbows near sides. Hold momentarily. Slowly return to starting position. Repeat _10-20__ times. Band color ___red___    Also issued hand out on [place] 4's to start 12/26/ if no pain

## 2015-06-25 NOTE — Therapy (Signed)
[facility] [place] [address] [place], [place], [address] Phone: [phone]   Fax:  [phone]  Physical Therapy Evaluation  Patient Details  Name: Lindsay Moore MRN: [id number] Date of Birth: 03-27-1956 Referring Provider: Dr Benjamin Stain  Encounter Date: 06/25/2015      PT End of Session - 06/25/15 1534    Visit Number 1   Number of Visits 1   Date for PT Re-Evaluation --  eval only   PT Start Time 1424   PT Stop Time 1533   PT Time Calculation (min) 69 min   Activity Tolerance Patient tolerated treatment well      Past Medical History  Diagnosis Date  . Essential hypertension, benign 06/10/2013  . Hyperlipidemia 06/10/2013  . 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 Right knee pain 06/10/2013    Workers Comp: [facility] Dr. Luiz Blare   . 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 Orthoscopic knee surgery  10/2012  . Gallbladder surgery    . Broken hip  2000  . Foot tendon surgery  2000  . Tubal ligation  1988  . Tonsillectomy  1959    There were no vitals filed for this visit.  Visit Diagnosis:  Pain in joint of right shoulder - Plan: PT plan of care cert/re-cert  Weakness of shoulder - Plan: PT plan of care cert/re-cert  Stiffness of shoulder joint, right - Plan: PT plan of care cert/re-cert      Subjective Assessment - 06/25/15 1438    Subjective Pt reports she developed Rt shoulder pain about 4 months , it got progressively worse, was performing ex from the MD and it was getting worse. Had an injection this week and it is improved 50% .     Pertinent History Pt states he co-pay is very high and she is unable to attend sessions due to this, requested HEP.    Diagnostic tests none   Patient Stated Goals bowling and crafts without pain, will not be able to attend due to finances.    Currently in Pain? Yes   Pain Score 3    Pain Location Arm   Pain  Orientation Right;Anterior   Pain Descriptors / Indicators Dull   Pain Frequency Constant   Aggravating Factors  lifting   Pain Relieving Factors heat            [facility] PT Assessment - 06/25/15 0001    Assessment   Medical Diagnosis Rt biceps tendonitis   Referring Provider Dr Benjamin Stain   Onset Date/Surgical Date 02/23/15   Hand Dominance Right   Next MD Visit 07/2015   Prior Therapy not for this   Precautions   Precautions None   Balance Screen   Has the patient fallen in the past 6 months No   Has the patient had a decrease in activity level because of a fear of falling?  No   Is the patient reluctant to leave their home because of a fear of falling?  No   Prior Function   Level of Independence Independent  some difficulty with coat and shirt.    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    Leisure bowling, crafts, sewing   Observation/Other Assessments   Focus on Therapeutic Outcomes (FOTO)  51% limited   Observation/Other Assessments-Edema    Edema --  palpable on anterior Rt shoulder joint  Posture/Postural Control   Posture Comments slight forward head and rounded shoulders, able to self correct.    ROM / Strength   AROM / PROM / Strength AROM;PROM;Strength   AROM   Overall AROM Comments cervical WNL   AROM Assessment Site Shoulder   Right/Left Shoulder Right   Right Shoulder Extension 60 Degrees   Right Shoulder Flexion 132 Degrees   Right Shoulder ABduction 118 Degrees   Right Shoulder Internal Rotation 85 Degrees   Right Shoulder External Rotation 88 Degrees   PROM   PROM Assessment Site Shoulder   Right/Left Shoulder Right;Left   Strength   Strength Assessment Site Shoulder;Elbow   Right/Left Shoulder Right  Lt WNL   Right Shoulder Flexion 4+/5   Right Shoulder ABduction 4/5  with pain   Right Shoulder Internal Rotation 5/5   Right Shoulder External Rotation 4+/5   Right/Left Elbow Right   Right  Elbow Flexion 4-/5  with pain   Right Elbow Extension 4+/5   Flexibility   Soft Tissue Assessment /Muscle Length --  tight pecs   Palpation   Palpation comment tenderness all around Rt shoulder joint.                    [facility] Adult PT Treatment/Exercise - 06/25/15 0001    Self-Care   Self-Care Posture;ADL's  shoulder mechanics and safe movement.    Posture also instructed in exercise progression per handouts.    Exercises   Exercises Shoulder   Shoulder Exercises: Standing   Retraction Both;20 reps   Shoulder Exercises: Isometric Strengthening   Extension 5X10"   External Rotation 5X10"   Internal Rotation 5X10"   Shoulder Exercises: Stretch   Other Shoulder Stretches door way stretch hands low.                PT Education - 06/25/15 1545    Education provided Yes   Education Details HEP   Person(s) Educated Patient   Methods Explanation;Demonstration;Handout   Comprehension Returned demonstration;Verbalized understanding             PT Long Term Goals - 06/25/15 1547    PT LONG TERM GOAL #1   Title I with HEP    Status Achieved   PT LONG TERM GOAL #2   Title verbalized understanding of shoulder mechanics as relates to injury prevention    Status Achieved               Plan - 06/25/15 1545    Clinical Impression Statement 59 yo female present with one month h/o Rt shoulder pain, she had an injection acouple days ago and is 50% better.  She has decreased ROM, strength and pain.  She is also unable to attend PT sessions due to finances.  She was instructed in shoudler mechanics, posture, initial HEP and HEP progression and informed to call with any  questions.    Rehab Potential Good   PT Frequency One time visit   PT Next Visit Plan evalutation and HEP instruction only    Consulted and Agree with Plan of Care Patient         Problem List Patient Active Problem List   Diagnosis Date Noted  . Biceps tendinitis on right  06/22/2015  . Serous otitis media 01/06/2015  . Postmenopausal bleeding 08/17/2014  . History of abnormal cervical Pap smear 06/19/2014  . Lumbar strain 01/06/2014  . Breast mass 07/11/2013  . History of colonic polyps 06/19/2013  . TIA (transient ischemic  attack) 06/10/2013  . Essential hypertension, benign 06/10/2013  . Septal defect 06/10/2013  . 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 Right knee pain 06/10/2013  . Situational anxiety 06/10/2013  . Hyperlipidemia 06/10/2013    Roderic Scarce PT 06/25/2015, 3:50 PM  [facility] [address] [place], [place], [address] Phone: [phone]   Fax:  [phone]  Name: Lindsay Moore MRN: [id number] Date of Birth: [birth year]

## 2015-07-07 ENCOUNTER — Telehealth: Payer: Self-pay | Admitting: Family Medicine

## 2015-07-07 DIAGNOSIS — Z0000 Encounter for general adult medical examination without abnormal findings: Secondary | ICD-10-CM

## 2015-07-07 LAB — LIPID PANEL
CHOLESTEROL: 220 mg/dL — AB (ref 125–200)
HDL: 69 mg/dL (ref >=46)
LDL CALC: 129 mg/dL (ref <130)
TRIGLYCERIDES: 112 mg/dL (ref <150)
Total CHOL/HDL Ratio: 3.2 Ratio (ref <=5.0)
VLDL: 22 mg/dL (ref <30)

## 2015-07-07 LAB — COMPLETE METABOLIC PANEL WITH GFR
ALT: 16 U/L (ref 6–29)
AST: 20 U/L (ref 10–35)
Albumin: 4.3 g/dL (ref 3.6–5.1)
Alkaline Phosphatase: 82 U/L (ref 33–130)
BILIRUBIN TOTAL: 0.6 mg/dL (ref 0.2–1.2)
BUN: 18 mg/dL (ref 7–25)
CHLORIDE: 98 mmol/L (ref 98–110)
CO2: 28 mmol/L (ref 20–31)
CREATININE: 1.25 mg/dL — AB (ref 0.50–0.99)
Calcium: 9.7 mg/dL (ref 8.6–10.4)
GFR, Est African American: 54 mL/min — ABNORMAL LOW (ref >=60)
GFR, Est Non African American: 47 mL/min — ABNORMAL LOW (ref >=60)
GLUCOSE: 88 mg/dL (ref 65–99)
Potassium: 3.4 mmol/L — ABNORMAL LOW (ref 3.5–5.3)
Sodium: 138 mmol/L (ref 135–146)
TOTAL PROTEIN: 7 g/dL (ref 6.1–8.1)

## 2015-07-07 LAB — CBC
HCT: 42.2 % (ref 36.0–46.0)
Hemoglobin: 14.5 g/dL (ref 12.0–15.0)
MCH: 29.8 pg (ref 26.0–34.0)
MCHC: 34.4 g/dL (ref 30.0–36.0)
MCV: 86.7 fL (ref 78.0–100.0)
MPV: 9.6 fL (ref 8.6–12.4)
PLATELETS: 370 10*3/uL (ref 150–400)
RBC: 4.87 MIL/uL (ref 3.87–5.11)
RDW: 13.3 % (ref 11.5–15.5)
WBC: 9.9 10*3/uL (ref 4.0–10.5)

## 2015-07-07 LAB — TSH: TSH: 1.189 u[IU]/mL (ref 0.350–4.500)

## 2015-07-07 NOTE — Telephone Encounter (Signed)
Upcoming physical

## 2015-07-13 ENCOUNTER — Ambulatory Visit (INDEPENDENT_AMBULATORY_CARE_PROVIDER_SITE_OTHER): Payer: BC Managed Care – PPO | Admitting: Family Medicine

## 2015-07-13 ENCOUNTER — Encounter: Payer: Self-pay | Admitting: Family Medicine

## 2015-07-13 VITALS — BP 156/98 | HR 96 | Wt 149.0 lb

## 2015-07-13 DIAGNOSIS — Z0000 Encounter for general adult medical examination without abnormal findings: Secondary | ICD-10-CM | POA: Diagnosis not present

## 2015-07-13 MED ORDER — ZOSTER VACCINE LIVE 19400 UNT/0.65ML ~~LOC~~ SOLR: 0.6500 mL | Freq: Once | SUBCUTANEOUS | Status: DC

## 2015-07-13 NOTE — Progress Notes (Signed)
CC: Lindsay Moore is a 60 y.o. female is here for Annual Exam   Subjective: HPI:  Colonoscopy: Repeat needed Spring 2021 Papsmear: Normal 2015 Mammogram:UTD from 06/2015  Influenza Vaccine: UTD Pneumovax: no current indication Td/Tdap: no current indication Zoster: (Start 61 yo)   Requesting complete physical exam with no acute complaints  Review of Systems - General ROS: negative for - chills, fever, night sweats, weight gain or weight loss Ophthalmic ROS: negative for - decreased vision Psychological ROS: negative for - anxiety or depression ENT ROS: negative for - hearing change, nasal congestion, tinnitus or allergies Hematological and Lymphatic ROS: negative for - bleeding problems, bruising or swollen lymph nodes Breast ROS: negative Respiratory ROS: no cough, shortness of breath, or wheezing Cardiovascular ROS: no chest pain or dyspnea on exertion Gastrointestinal ROS: no abdominal pain, change in bowel habits, or black or bloody stools Genito-Urinary ROS: negative for - genital discharge, genital ulcers, incontinence or abnormal bleeding from genitals Musculoskeletal ROS: negative for - joint pain or muscle pain Neurological ROS: negative for - headaches or memory loss Dermatological ROS: negative for lumps, mole changes, rash and skin lesion changes  Past Medical History  Diagnosis Date  . Essential hypertension, benign 06/10/2013  . Hyperlipidemia 06/10/2013  . 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 Right knee pain 06/10/2013    Workers Comp: [facility] Dr. Luiz Blare   . 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 Orthoscopic knee surgery  10/2012  . Gallbladder surgery    . Broken hip  2000  . Foot tendon surgery  2000  . Tubal ligation  1988  . Tonsillectomy  1959   Family History  Problem Relation Age of Onset  . Alcoholism Father   . Lung cancer Father   . Diabetes      grandmother  . Hyperlipidemia Mother    . Hypertension Mother   . Stroke      grandmother    Social History   Social History  . Marital Status: Married    Spouse Name: N/A  . Number of Children: N/A  . Years of Education: N/A   Occupational History  . teacher    Social History Main Topics  . Smoking status: Former Smoker -- 1.00 packs/day for 25 years    Types: Cigarettes  . Smokeless tobacco: Never Used  . Alcohol Use: 0.6 - 1.2 oz/week    1-2 Standard drinks or equivalent per week  . Drug Use: No  . Sexual Activity:    Partners: Male    Birth Control/ Protection: Surgical   Other Topics Concern  . Not on file   Social History Narrative     Objective: BP 156/98 mmHg  Pulse 96  Wt 149 lb (67.586 kg)  General: No Acute Distress HEENT: Atraumatic, normocephalic, conjunctivae normal without scleral icterus.  No nasal discharge, hearing grossly intact, TMs with good landmarks bilaterally with no middle ear abnormalities, posterior pharynx clear without oral lesions. Neck: Supple, trachea midline, no cervical nor supraclavicular adenopathy. Pulmonary: Clear to auscultation bilaterally without wheezing, rhonchi, nor rales. Cardiac: Regular rate and rhythm.  No murmurs, rubs, nor gallops. No peripheral edema.  2+ peripheral pulses bilaterally. Abdomen: Bowel sounds normal.  No masses.  Non-tender without rebound.  Negative Murphy's sign. GUdeclined by patientMSK: Grossly intact, no signs of weakness.  Full strength throughout upper and lower extremities.  Full ROM in upper and lower extremities.  No  midline spinal tenderness. Neuro: Gait unremarkable, CN II-XII grossly intact.  C5-C6 Reflex 2/4 Bilaterally, L4 Reflex 2/4 Bilaterally.  Cerebellar function intact. Skin: No rashes. Psych: Alert and oriented to person/place/time.  Thought process normal. No anxiety/depression.   Assessment & Plan: Lindsay Moore was seen today for annual exam.  Diagnoses and all orders for this visit:  Annual physical exam  Other  orders -     zoster vaccine live, PF, (ZOSTAVAX) [id number] UNT/0.65ML injection; Inject 19,400 Units into the skin once.   Healthy lifestyle interventions including but not limited to regular exercise, a healthy low fat diet, moderation of salt intake, the dangers of tobacco/alcohol/recreational drug use, nutrition supplementation, and accident avoidance were discussed with the patient and a handout was provided for future reference.  Return in about 6 months (around 01/10/2016).

## 2015-07-23 ENCOUNTER — Encounter: Payer: Self-pay | Admitting: Sports Medicine

## 2015-07-23 ENCOUNTER — Ambulatory Visit (INDEPENDENT_AMBULATORY_CARE_PROVIDER_SITE_OTHER): Payer: BC Managed Care – PPO | Admitting: Sports Medicine

## 2015-07-23 VITALS — BP 147/80 | HR 95 | Temp 98.4°F | Resp 18 | Wt 151.0 lb

## 2015-07-23 DIAGNOSIS — M7521 Bicipital tendinitis, right shoulder: Secondary | ICD-10-CM | POA: Diagnosis not present

## 2015-07-23 NOTE — Progress Notes (Signed)
  Subjective:    CC: Follow-up  HPI: Right biceps tendinitis: Essentially resolved after biceps tendon sheath injection and formal physical therapy. She has developed some minor pain over the lateral tip of the lateral epicondyle, mild, persistent.  Past medical history, Surgical history, Family history not pertinant except as noted below, Social history, Allergies, and medications have been entered into the medical record, reviewed, and no changes needed.   Review of Systems: No fevers, chills, night sweats, weight loss, chest pain, or shortness of breath.   Objective:    General: Well Developed, well nourished, and in no acute distress.  Neuro: Alert and oriented x3, extra-ocular muscles intact, sensation grossly intact.  HEENT: Normocephalic, atraumatic, pupils equal round reactive to light, neck supple, no masses, no lymphadenopathy, thyroid nonpalpable.  Skin: Warm and dry, no rashes. Cardiac: Regular rate and rhythm, no murmurs rubs or gallops, no lower extremity edema.  Respiratory: Clear to auscultation bilaterally. Not using accessory muscles, speaking in full sentences. Right Elbow: Unremarkable to inspection. Range of motion full pronation, supination, flexion, extension. Strength is full to all of the above directions Stable to varus, valgus stress. Negative moving valgus stress test. Tender to palpation of the common extensor tendon origin Ulnar nerve does not sublux. Negative cubital tunnel Tinel's. Right Shoulder: Inspection reveals no abnormalities, atrophy or asymmetry. Palpation is normal with no tenderness over AC joint or bicipital groove. ROM is full in all planes. Rotator cuff strength normal throughout. No signs of impingement with negative Neer and Hawkin's tests, empty can. Speeds and Yergason's tests normal. No labral pathology noted with negative Obrien's, negative crank, negative clunk, and good stability. Normal scapular function observed. No painful  arc and no drop arm sign. No apprehension sign  Impression and Recommendations:

## 2015-07-23 NOTE — Assessment & Plan Note (Signed)
Essentially resolved after injection and physical therapy of the biceps tendon sheath.  She is having a bit of lateral epicondylitis-type pain, rehabilitation exercises given, return as needed, may return to bowling.

## 2015-07-26 ENCOUNTER — Telehealth: Payer: Self-pay

## 2015-07-26 DIAGNOSIS — H547 Unspecified visual loss: Secondary | ICD-10-CM

## 2015-07-26 DIAGNOSIS — I10 Essential (primary) hypertension: Secondary | ICD-10-CM

## 2015-07-26 NOTE — Telephone Encounter (Signed)
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 I did let her know that a TSH was drawn when she was last here and that it was normal range.

## 2015-07-26 NOTE — Telephone Encounter (Signed)
Ok, lab slip in your in box.

## 2015-07-26 NOTE — Telephone Encounter (Signed)
Pt.notified

## 2015-07-27 LAB — T4, FREE: Free T4: 1.3 ng/dL (ref 0.80–1.80)

## 2015-07-27 LAB — T3, FREE: T3, Free: 2.9 pg/mL (ref 2.3–4.2)

## 2015-09-17 ENCOUNTER — Encounter: Payer: Self-pay | Admitting: Sports Medicine

## 2015-09-17 ENCOUNTER — Ambulatory Visit (INDEPENDENT_AMBULATORY_CARE_PROVIDER_SITE_OTHER): Payer: BC Managed Care – PPO | Admitting: Sports Medicine

## 2015-09-17 VITALS — BP 161/97 | HR 88 | Resp 18 | Wt 153.8 lb

## 2015-09-17 DIAGNOSIS — M7521 Bicipital tendinitis, right shoulder: Secondary | ICD-10-CM

## 2015-09-17 NOTE — Addendum Note (Signed)
Addended by: Baird KayUGLAS, Justa Hatchell M on: 09/17/2015 04:16 PM   Modules accepted: Medications

## 2015-09-17 NOTE — Assessment & Plan Note (Signed)
Has done well for the past 3 months, but unfortunately had a freak accident and seems to have strained her biceps tendon again. It does not seem to be fully torn on exam. Injection as above,  Sling. Rest this for 2 weeks and then return to see me.

## 2015-09-17 NOTE — Progress Notes (Signed)
  Subjective:    CC:  Right shoulder pain  HPI: This is a pleasant 60-year-old female, she comes in with a several day history of severe pain over the proximal anterior right shoulder after a flexion and supination event. Pain is severe, persistent. No mechanical symptoms.  Past medical history, Surgical history, Family history not pertinant except as noted below, Social history, Allergies, and medications have been entered into the medical record, reviewed, and no changes needed.   Review of Systems: No fevers, chills, night sweats, weight loss, chest pain, or shortness of breath.   Objective:    General: Well Developed, well nourished, and in no acute distress.  Neuro: Alert and oriented x3, extra-ocular muscles intact, sensation grossly intact.  HEENT: Normocephalic, atraumatic, pupils equal round reactive to light, neck supple, no masses, no lymphadenopathy, thyroid nonpalpable.  Skin: Warm and dry, no rashes. Cardiac: Regular rate and rhythm, no murmurs rubs or gallops, no lower extremity edema.  Respiratory: Clear to auscultation bilaterally. Not using accessory muscles, speaking in full sentences. Right shoulder: Tender to palpation over the proximal biceps as well as bicipital groove, reproduction of pain with resisted supination, there is a positive speeds and Yergason tests. Impingement signs are negative, glenohumeral signs are negative. Neurovascularly intact distally.  Procedure: Real-time Ultrasound Guided Injection of  Right bicipital tendon sheath. Device: GE Logiq E  Verbal informed consent obtained.  Time-out conducted.  Noted no overlying erythema, induration, or other signs of local infection.  Skin prepped in a sterile fashion.  Local anesthesia: Topical Ethyl chloride.  With sterile technique and under real time ultrasound guidance:   I scanned the entire length of the biceps brachii, noted no tears in the muscle fibers themselves, the long head of the biceps tendon  was well placed in the bicipital groove though it did look a bit diminutive. 25-gauge needle advanced into the bicipital groove and 1 mL Kenalog 40, 1 mL lidocaine, 1 mL Marcaine injected easily. Completed without difficulty  Pain immediately resolved suggesting accurate placement of the medication.  Advised to call if fevers/chills, erythema, induration, drainage, or persistent bleeding.  Images permanently stored and available for review in the ultrasound unit.  Impression: Technically successful ultrasound guided injection.  Impression and Recommendations:

## 2015-10-05 ENCOUNTER — Ambulatory Visit (INDEPENDENT_AMBULATORY_CARE_PROVIDER_SITE_OTHER): Payer: BC Managed Care – PPO | Admitting: Sports Medicine

## 2015-10-05 ENCOUNTER — Encounter: Payer: Self-pay | Admitting: Sports Medicine

## 2015-10-05 DIAGNOSIS — M7521 Bicipital tendinitis, right shoulder: Secondary | ICD-10-CM

## 2015-10-05 MED ORDER — DIAZEPAM 5 MG PO TABS: ORAL_TABLET | ORAL | Status: DC

## 2015-10-05 NOTE — Assessment & Plan Note (Signed)
Unfortunately with persistent pain referable to the bicipital muscle belly as well as the long head of the biceps tendon. This is despite bicipital tendon sheath injection at the last visit, we are going to proceed with an MRI of the right shoulder with extension down to the mid humerus.

## 2015-10-05 NOTE — Progress Notes (Signed)
  Subjective:    CC: follow-up  HPI: Right shoulder pain: Symptoms resemble bicipital tendinitis, we did a biceps tendon sheath injection at the last visit and placed her in a sling, unfortunately she has not improved.  Past medical history, Surgical history, Family history not pertinant except as noted below, Social history, Allergies, and medications have been entered into the medical record, reviewed, and no changes needed.   Review of Systems: No fevers, chills, night sweats, weight loss, chest pain, or shortness of breath.   Objective:    General: Well Developed, well nourished, and in no acute distress.  Neuro: Alert and oriented x3, extra-ocular muscles intact, sensation grossly intact.  HEENT: Normocephalic, atraumatic, pupils equal round reactive to light, neck supple, no masses, no lymphadenopathy, thyroid nonpalpable.  Skin: Warm and dry, no rashes. Cardiac: Regular rate and rhythm, no murmurs rubs or gallops, no lower extremity edema.  Respiratory: Clear to auscultation bilaterally. Not using accessory muscles, speaking in full sentences. Right Shoulder: Inspection reveals no abnormalities, atrophy or asymmetry. Palpation is normal with no tenderness over AC joint or bicipital groove. ROM is full in all planes. Rotator cuff strength normal throughout. No signs of impingement with negative Neer and Hawkin's tests, empty can. Speeds and Yergason's tests both positive with weakness. No labral pathology noted with negative Obrien's, negative crank, negative clunk, and good stability. Normal scapular function observed. No painful arc and no drop arm sign. No apprehension sign  Impression and Recommendations:    I spent 25 minutes with this patient, greater than 50% was face-to-face time counseling regarding the above diagnoses

## 2015-10-07 ENCOUNTER — Ambulatory Visit (INDEPENDENT_AMBULATORY_CARE_PROVIDER_SITE_OTHER): Payer: BC Managed Care – PPO | Admitting: Family Medicine

## 2015-10-07 ENCOUNTER — Encounter: Payer: Self-pay | Admitting: Family Medicine

## 2015-10-07 VITALS — BP 132/91 | HR 101 | Temp 98.6°F | Wt 153.0 lb

## 2015-10-07 DIAGNOSIS — J029 Acute pharyngitis, unspecified: Secondary | ICD-10-CM

## 2015-10-07 MED ORDER — IPRATROPIUM BROMIDE 0.06 % NA SOLN: 2.0000 | NASAL | Status: DC | PRN

## 2015-10-07 MED ORDER — PREDNISONE 10 MG PO TABS: 30.0000 mg | ORAL_TABLET | Freq: Every day | ORAL | Status: DC

## 2015-10-07 NOTE — Patient Instructions (Signed)
Thank you for coming in today. Continue Tylenol. Use Atrovent nasal spray. Use prednisone if symptoms are worse than the side effects for you.  Pharyngitis Pharyngitis is redness, pain, and swelling (inflammation) of your pharynx.  CAUSES  Pharyngitis is usually caused by infection. Most of the time, these infections are from viruses (viral) and are part of a cold. However, sometimes pharyngitis is caused by bacteria (bacterial). Pharyngitis can also be caused by allergies. Viral pharyngitis may be spread from person to person by coughing, sneezing, and personal items or utensils (cups, forks, spoons, toothbrushes). Bacterial pharyngitis may be spread from person to person by more intimate contact, such as kissing.  SIGNS AND SYMPTOMS  Symptoms of pharyngitis include:   Sore throat.   Tiredness (fatigue).   Low-grade fever.   Headache.  Joint pain and muscle aches.  Skin rashes.  Swollen lymph nodes.  Plaque-like film on throat or tonsils (often seen with bacterial pharyngitis). DIAGNOSIS  Your health care provider will ask you questions about your illness and your symptoms. Your medical history, along with a physical exam, is often all that is needed to diagnose pharyngitis. Sometimes, a rapid strep test is done. Other lab tests may also be done, depending on the suspected cause.  TREATMENT  Viral pharyngitis will usually get better in 3-4 days without the use of medicine. Bacterial pharyngitis is treated with medicines that kill germs (antibiotics).  HOME CARE INSTRUCTIONS   Drink enough water and fluids to keep your urine clear or pale yellow.   Only take over-the-counter or prescription medicines as directed by your health care provider:   If you are prescribed antibiotics, make sure you finish them even if you start to feel better.   Do not take aspirin.   Get lots of rest.   Gargle with 8 oz of salt water ( tsp of salt per 1 qt of water) as often as every  1-2 hours to soothe your throat.   Throat lozenges (if you are not at risk for choking) or sprays may be used to soothe your throat. SEEK MEDICAL CARE IF:   You have large, tender lumps in your neck.  You have a rash.  You cough up green, yellow-brown, or bloody spit. SEEK IMMEDIATE MEDICAL CARE IF:   Your neck becomes stiff.  You drool or are unable to swallow liquids.  You vomit or are unable to keep medicines or liquids down.  You have severe pain that does not go away with the use of recommended medicines.  You have trouble breathing (not caused by a stuffy nose). MAKE SURE YOU:   Understand these instructions.  Will watch your condition.  Will get help right away if you are not doing well or get worse.   This information is not intended to replace advice given to you by your health care provider. Make sure you discuss any questions you have with your health care provider.   Document Released: 06/19/2005 Document Revised: 04/09/2013 Document Reviewed: 02/24/2013 Elsevier Interactive Patient Education [employer]2016 [employer] Inc.

## 2015-10-07 NOTE — Progress Notes (Signed)
Lindsay Moore is a 60 y.o. female who presents to [facility] Primary [facility] today for sore throat. Patient is a 2 day history of significant sore throat. She notes mild lymph node swelling bilaterally. She notes she mild subjective fever. No vomiting diarrhea chest pain or palpitations. Her symptoms are somewhat consistent with previous episodes of strep throat. 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 She denies significant cough or congestion.   Past Medical History  Diagnosis Date  . Essential hypertension, benign 06/10/2013  . Hyperlipidemia 06/10/2013  . 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 Right knee pain 06/10/2013    Workers Comp: [facility] Dr. Luiz Blare   . 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 Orthoscopic knee surgery  10/2012  . Gallbladder surgery    . Broken hip  2000  . Foot tendon surgery  2000  . Tubal ligation  1988  . Tonsillectomy  1959   Social History  Substance Use Topics  . Smoking status: Former Smoker -- 1.00 packs/day for 25 years    Types: Cigarettes  . Smokeless tobacco: Never Used  . Alcohol Use: 0.6 - 1.2 oz/week    1-2 Standard drinks or equivalent per week   family history includes Alcoholism in her father; Hyperlipidemia in her mother; Hypertension in her mother; Lung cancer in her father.  ROS as above Medications: Current Outpatient Prescriptions  Medication Sig Dispense Refill  . aspirin EC 81 MG tablet Take 1 tablet (81 mg total) by mouth daily. 150 tablet 2  . Calcium Carb-Cholecalciferol (CALCIUM + D3 PO) Take by mouth.    . diazepam (VALIUM) 5 MG tablet Take 1 tab PO 1 hour before procedure or imaging. 2 tablet 0  . KRILL OIL PO Take by mouth.    . metoprolol succinate (TOPROL-XL) 25 MG 24 hr tablet TAKE ONE TABLET BY MOUTH ONCE DAILY WITH OR IMMEDIATELY FOLLOWING A  MEAL. 90 tablet 2  . Multiple Vitamin (MULTIVITAMIN) tablet Take 1 tablet by mouth daily.    . ranitidine (ZANTAC) 150 MG tablet Take 150 mg by mouth 2 (two) times daily.    Marland Kitchen triamterene-hydrochlorothiazide (MAXZIDE-25) 37.5-25 MG tablet Take 1 tablet by mouth daily. 90 tablet 2  . ipratropium (ATROVENT) 0.06 % nasal spray Place 2 sprays into both nostrils every 4 (four) hours as needed for rhinitis. 10 mL 6  . predniSONE (DELTASONE) 10 MG tablet Take 3 tablets (30 mg total) by mouth daily with breakfast. 15 tablet 0   No current facility-administered medications for this visit.   Allergies  Allergen Reactions  . Amlodipine     edema  . Bee Venom   . Ibuprofen     REACTION: breathing difficulty  . Lisinopril     tongue swelling     Exam:  BP 132/91 mmHg  Pulse 101  Temp(Src) 98.6 F (37 C) (Oral)  Wt 153 lb (69.4 kg)  SpO2 96% Gen: Well NAD HEENT: EOMI,  MMM posterior pharynx with mild erythema and cobblestoning. No exudates.Clear nasal discharge. Mild cervical lymphadenopathy present bilaterally. Lungs: Normal work of breathing. CTABL Heart: RRR no MRG Abd: NABS, Soft. Nondistended, Nontender Exts: Brisk capillary refill, warm and well perfused.   Point of care rapid strep test: Negative  No results found for this or any previous visit (from the past 24 hour(s)). No results found.  60 year old woman with viral pharyngitis.Treat symptomatically with Tylenol Atrovent nasal spray Return as needed.

## 2015-10-11 ENCOUNTER — Ambulatory Visit (INDEPENDENT_AMBULATORY_CARE_PROVIDER_SITE_OTHER): Payer: BC Managed Care – PPO

## 2015-10-11 DIAGNOSIS — M7521 Bicipital tendinitis, right shoulder: Secondary | ICD-10-CM

## 2015-10-19 ENCOUNTER — Ambulatory Visit (INDEPENDENT_AMBULATORY_CARE_PROVIDER_SITE_OTHER): Payer: BC Managed Care – PPO | Admitting: Sports Medicine

## 2015-10-19 DIAGNOSIS — M7521 Bicipital tendinitis, right shoulder: Secondary | ICD-10-CM

## 2015-10-19 NOTE — Assessment & Plan Note (Signed)
Unfortunately with persistent pain despite bicipital sheath injection, formal physical therapy. MRI shows at least partial rupture of the long head of the biceps, this is likely her pain generator. Referral to Dr. Ave Filterhandler for operative intervention, likely biceps tenodesis.

## 2015-10-19 NOTE — Progress Notes (Signed)
  Subjective:    CC: Right shoulder  HPI: This is a pleasant 60 year old female, we have been treating her shoulder for some time now, initially symptoms were bicipital, she failed physical therapy so we proceeded with biceps tendon sheath injection, this provided temporary relief, unfortunately started having persistent pains with obtain an MRI of the results of which will be dictated below.  Past medical history, Surgical history, Family history not pertinant except as noted below, Social history, Allergies, and medications have been entered into the medical record, reviewed, and no changes needed.   Review of Systems: No fevers, chills, night sweats, weight loss, chest pain, or shortness of breath.   Objective:    General: Well Developed, well nourished, and in no acute distress.  Neuro: Alert and oriented x3, extra-ocular muscles intact, sensation grossly intact.  HEENT: Normocephalic, atraumatic, pupils equal round reactive to light, neck supple, no masses, no lymphadenopathy, thyroid nonpalpable.  Skin: Warm and dry, no rashes. Cardiac: Regular rate and rhythm, no murmurs rubs or gallops, no lower extremity edema.  Respiratory: Clear to auscultation bilaterally. Not using accessory muscles, speaking in full sentences.  MRI reviewed and shows likely completed not high-grade partial rupture of the long head of the biceps, there is also supraspinatus  intrasubstance tendinosis. No rotator cuff tearing.  Impression and Recommendations:   I spent 25 minutes with this patient, greater than 50% was face-to-face time counseling regarding the above diagnoses

## 2015-11-10 ENCOUNTER — Ambulatory Visit (INDEPENDENT_AMBULATORY_CARE_PROVIDER_SITE_OTHER): Payer: BC Managed Care – PPO | Admitting: Physical Therapy

## 2015-11-10 DIAGNOSIS — M25612 Stiffness of left shoulder, not elsewhere classified: Secondary | ICD-10-CM

## 2015-11-10 DIAGNOSIS — M25511 Pain in right shoulder: Secondary | ICD-10-CM

## 2015-11-10 DIAGNOSIS — M6281 Muscle weakness (generalized): Secondary | ICD-10-CM | POA: Diagnosis not present

## 2015-11-10 NOTE — Therapy (Signed)
[facility] [address] [place], [place], [address] Phone: [phone]   Fax:  [phone]  Physical Therapy Evaluation  Patient Details  Name: Lindsay Moore MRN: [id number] Date of Birth: 1955/10/14 Referring Provider: Dr Jones BroomJustin Chandler  Encounter Date: 11/10/2015      PT End of Session - 11/10/15 1147    Visit Number 1   Number of Visits 6   Date for PT Re-Evaluation 02/02/16   PT Start Time 1055   PT Stop Time 1153   PT Time Calculation (min) 58 min   Activity Tolerance Patient tolerated treatment well      Past Medical History  Diagnosis Date  . Essential hypertension, benign 06/10/2013  . Hyperlipidemia 06/10/2013  . 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 Right knee pain 06/10/2013    Workers Comp: [facility] Dr. Luiz BlareGraves   . 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 Orthoscopic knee surgery  10/2012  . Gallbladder surgery    . Broken hip  2000  . Foot tendon surgery  2000  . Tubal ligation  1988  . Tonsillectomy  1959    There were no vitals filed for this visit.       Subjective Assessment - 11/10/15 1055    Subjective Pt had initial Rt shoulder pain starting last summer, after MRI she had elective surgery a week ago and hasn't done to much as she is fearful.  She is only wearing her sling in public.  She sleeps on her Rt side and is unable to do this now. She had her stiches out this AM, performing AAROM flexion, ER.    Pertinent History during surgery she had the long head of the biceps cleaned and removed.     Diagnostic tests MRI   Patient Stated Goals bowling, sewing and crafts.    Currently in Pain? Yes   Pain Score 5    Pain Location Shoulder   Pain Orientation Right   Pain Descriptors / Indicators Pressure;Burning;Aching   Pain Type Surgical pain   Pain Onset More than a month ago   Pain Frequency Constant   Aggravating Factors  moving  arm and sleeping   Pain Relieving Factors ice 4-5 times a day, rest - no activity            [facility] PT Assessment - 11/10/15 0001    Assessment   Medical Diagnosis s/p Rt shoulder scope, distal clavicle excision   Referring Provider Dr Jones BroomJustin Chandler   Onset Date/Surgical Date 11/02/15   Hand Dominance Right   Next MD Visit 12/01/15   Precautions   Precautions --  use pain as a guide, able to lift as tolerated   Required Braces or Orthoses Sling  PRN   Balance Screen   Has the patient fallen in the past 6 months No   Has the patient had a decrease in activity level because of a fear of falling?  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   Living Environment Private residence   Living Arrangements Spouse/significant other   Prior Function   Level of Independence Needs assistance with ADLs  bathing and dressing   Vocation Retired   GafferVocation Requirements --   Leisure sew, craft, bowl   Observation/Other Assessments   Focus on Therapeutic Outcomes (FOTO)  75% limited   Posture/Postural Control   Posture/Postural Control Postural limitations   Postural Limitations Rounded Shoulders;Forward head  ROM / Strength   AROM / PROM / Strength AROM;PROM;Strength   AROM   AROM Assessment Site Shoulder;Elbow;Cervical   Right/Left Shoulder Right  Lt WNL   Right Shoulder Extension 50 Degrees   Right Shoulder Flexion 45 Degrees   Right Shoulder ABduction 30 Degrees   Right Shoulder Internal Rotation 74 Degrees   Right Shoulder External Rotation 10 Degrees   Right/Left Elbow --  WNL except pronation 55 egrees with pain   Cervical Flexion WNL moves into Lt rotation  with pain   Cervical Extension WNL   Cervical - Right Rotation 50   Cervical - Left Rotation 58   PROM   PROM Assessment Site Shoulder   Right/Left Shoulder Right   Right Shoulder Flexion 112 Degrees   Right Shoulder ABduction 63 Degrees   Right Shoulder Internal Rotation 81 Degrees   Right Shoulder External Rotation 35 Degrees    Strength   Strength Assessment Site Shoulder   Right/Left Shoulder Right  NA at this time   Palpation   Palpation comment some tenderness around the Rt shoulder joint                   [facility] Adult PT Treatment/Exercise - 11/10/15 0001    Exercises   Exercises Wrist;Shoulder;Elbow   Elbow Exercises   Other elbow exercises flex/ext x 10   Other elbow exercises grip x 20    Shoulder Exercises: ROM/Strengthening   Other ROM/Strengthening Exercises supine AAROM with cane, flex, abd, ER   Other ROM/Strengthening Exercises pendulum Rt shoulder    Shoulder Exercises: Stretch   Table Stretch - Flexion 5 reps   Modalities   Modalities Electrical Stimulation;Vasopneumatic   Electrical Stimulation   Electrical Stimulation Location Rt shoulder   Electrical Stimulation Action IFC   Electrical Stimulation Parameters to tolerance   Electrical Stimulation Goals Edema;Pain   Vasopneumatic   Number Minutes Vasopneumatic  15 minutes   Vasopnuematic Location  Shoulder  Rt   Vasopneumatic Pressure Low   Vasopneumatic Temperature  3*                PT Education - 11/10/15 1125    Education provided Yes   Education Details HEP    Person(s) Educated Patient   Methods Demonstration;Explanation;Handout   Comprehension Returned demonstration;Verbalized understanding          PT Short Term Goals - 11/10/15 1701    PT SHORT TERM GOAL #1   Title I with initial HEP ( 12/08/15)    Time 4   Period Weeks   Status New   PT SHORT TERM GOAL #2   Title demo Rt shoulder ROM WFL ( 12/08/15)    Time 4   Period Weeks   Status New           PT Long Term Goals - 11/10/15 1702    PT LONG TERM GOAL #1   Title I with advanced HEP ( 02/02/16)     Time 12   Period Weeks   Status New   PT LONG TERM GOAL #2   Title verbalized understanding of shoulder mechanics as relates to injury prevention, healing and progression post surgery ( 02/02/16)    Time 12   Period Weeks   Status New    PT LONG TERM GOAL #3   Title demo Rt shoulder strength =/> 4/5 through out ( 02/02/16)    Time 12   Period Weeks   Status New   PT LONG TERM GOAL #4  Title improve FOTO =/< 42% limited (02/02/16)    Time 12   Period Weeks   Status New               Plan - 11/10/15 1658    Clinical Impression Statement 60 yo female one week s/p Rt shoulder scope.  She has some pain, limited ROM, strength and postural changes.  She is very limited in abiltiy to attend PT sessions due to finances so we will be providing HEP, education and she will come every 2-3 wks as she is able .    Rehab Potential Fair  due to patients inability to attend sessions   PT Frequency --  once every 2-3 weeks   PT Duration 12 weeks   PT Treatment/Interventions Ultrasound;Neuromuscular re-education;Patient/family education;Passive range of motion;Cryotherapy;Electrical Stimulation;Dry needling;Moist Heat;Therapeutic exercise;Manual techniques;Vasopneumatic Device   PT Next Visit Plan progress HEP as tolerates Rt shoulder    Consulted and Agree with Plan of Care Patient      Patient will benefit from skilled therapeutic intervention in order to improve the following deficits and impairments:  Postural dysfunction, Decreased strength, Pain, Increased edema, Decreased range of motion, Increased muscle spasms  Visit Diagnosis: Pain in right shoulder - Plan: PT plan of care cert/re-cert  Muscle weakness (generalized) - Plan: PT plan of care cert/re-cert  Stiffness of left shoulder, not elsewhere classified - Plan: PT plan of care cert/re-cert     Problem List Patient Active Problem List   Diagnosis Date Noted  . Biceps tendinitis on right 06/22/2015  . Serous otitis media 01/06/2015  . Postmenopausal bleeding 08/17/2014  . History of abnormal cervical Pap smear 06/19/2014  . Lumbar strain 01/06/2014  . Breast mass 07/11/2013  . History of colonic polyps 06/19/2013  . TIA (transient ischemic attack) 06/10/2013   . Essential hypertension, benign 06/10/2013  . Septal defect 06/10/2013  . 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 Right knee pain 06/10/2013  . Situational anxiety 06/10/2013  . Hyperlipidemia 06/10/2013    Roderic Scarce PT 11/10/2015, 5:11 PM  [facility] [address] [place], [place], [address] Phone: [phone]   Fax:  [phone]  Name: Lindsay Moore MRN: [id number] Date of Birth: 1955-10-30

## 2015-11-10 NOTE — Patient Instructions (Signed)
ROM: Flexion - Wand (Supine)   [place] [phone]    Lie on back holding wand. Raise arms over head. Hold 2-3 secs.  Repeat _10-20___ times per set. Do __1__ sets per session. Do __2__ sessions per day.   ROM: External / Internal Rotation - Wand    Holding wand with left hand palm up, push out from body with other hand, palm down. Keep both elbows bent. When stretch is felt, hold _2-3___ seconds. Repeat to other side, leading with same hand. Keep elbows bent. Repeat _10-20___ times per set. Do __1__ sets per session. Do __2__ sessions per day.  ROM: Horizontal Abduction / Adduction - Wand    Keeping both palms down, push right hand across body with other hand. Then pull back across body, keeping arms parallel to floor. Do not allow trunk to twist. Hold __2-3__ seconds. Repeat _10-20___ times per set. Do __1__ sets per session. Do __2__ sessions per day.  Scapular Retraction (Standing)    With arms at sides, pinch shoulder blades together. Repeat _10-20___ times per set. Do __1__ sets per session. Do __2__ sessions per day.  ROM: Flexion    Keeping left arm on table, slide body away until stretch is felt. Hold _5___ seconds. Repeat _10-15___ times per set. Do __1__ sets per session. Do _2___ sessions per day.  ROM: Pendulum (Circular)    Let right arm move in circle clockwise, then counterclockwise, by rocking body weight in circular pattern. Circle _5-10___ times each direction per set. Do _1___ sets per session. Do __2__ sessions per day, or as needed.  Flexibility: Upper Trapezius Stretch    Gently grasp right side of head while reaching behind back with other hand. Tilt head away until a gentle stretch is felt. Hold _20-30___ seconds. Repeat on the other side. Repeat __1__ times per set. Do __1__ sets per session. Do __2__ sessions per day.  AROM: Elbow Flexion / Extension    With left hand palm up, gently bend elbow as far as possible. Then straighten arm as  far as possible. Repeat _10-20___ times per set. Do __1__ sets per session. Do __2__ sessions per day.  Towel Roll Squeeze    With right forearm resting on surface, gently squeeze towel. Repeat __10-20__ times per set. Do ___1_ sets per session. Do __2__ sessions per day.  Copyright  VHI. All rights reserved.

## 2015-11-30 ENCOUNTER — Encounter: Payer: Self-pay | Admitting: Physical Therapy

## 2015-11-30 ENCOUNTER — Ambulatory Visit (INDEPENDENT_AMBULATORY_CARE_PROVIDER_SITE_OTHER): Payer: BC Managed Care – PPO | Admitting: Physical Therapy

## 2015-11-30 DIAGNOSIS — M25511 Pain in right shoulder: Secondary | ICD-10-CM | POA: Diagnosis not present

## 2015-11-30 DIAGNOSIS — M6281 Muscle weakness (generalized): Secondary | ICD-10-CM | POA: Diagnosis not present

## 2015-11-30 NOTE — Therapy (Signed)
[place], [place], [address] Phone: [phone]   Fax:  [phone]  Physical Therapy Treatment  Patient Details  Name: Lindsay Moore MRN: [id number] Date of Birth: Nov 11, 1955 Referring Provider: Dr Tania Ade  Encounter Date: 11/30/2015      PT End of Session - 11/30/15 0859    Visit Number 3   Number of Visits 6   Date for PT Re-Evaluation 02/02/16   PT Start Time 0804   PT Stop Time 0849   PT Time Calculation (min) 45 min   Activity Tolerance Patient tolerated treatment well      Past Medical History  Diagnosis Date  . Essential hypertension, benign 06/10/2013  . Hyperlipidemia 06/10/2013  . 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 Right knee pain 06/10/2013    Workers Comp: [facility] Dr. Berenice Primas   . 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 Orthoscopic knee surgery  10/2012  . Gallbladder surgery    . Broken hip  2000  . Foot tendon surgery  2000  . Tubal ligation  1988  . Tonsillectomy  1959    There were no vitals filed for this visit.      Subjective Assessment - 11/30/15 0805    Subjective Pt states she has a lot more flexibility  in her Rt shoulder, she does feel like she has a catch in the front of her shoulder when she lifts it up   Patient Stated Goals bowling, sewing and crafts.    Currently in Pain? Yes   Pain Score 4    Pain Location Shoulder   Pain Orientation Right;Anterior   Pain Descriptors / Indicators Aching   Pain Type Surgical pain   Pain Onset More than a month ago   Pain Frequency Constant   Aggravating Factors  moving her arm   Pain Relieving Factors ice, rest            [facility] PT Assessment - 11/30/15 0001    Assessment   Medical Diagnosis s/p Rt shoulder scope, distal clavicle excision   Referring Provider Dr Tania Ade   Onset Date/Surgical Date 11/02/15   Hand Dominance Right   Next MD Visit 12/01/15   AROM   AROM Assessment Site Shoulder   Right/Left Shoulder Right   Right Shoulder Flexion 167 Degrees   Right Shoulder ABduction 178 Degrees   Right Shoulder Internal Rotation 92 Degrees   Right Shoulder External Rotation 88 Degrees   PROM   PROM Assessment Site Shoulder   Right/Left Shoulder Right   Right Shoulder Flexion 173 Degrees                     [facility] Adult PT Treatment/Exercise - 11/30/15 0001    Self-Care   Self-Care Other Self-Care Comments   Other Self-Care Comments  useage home TENs unit   Shoulder Exercises: Prone   Retraction Strengthening;Both  3x10   Extension AAROM;Both;Weights  3x10   Extension Weight (lbs) 1   Shoulder Exercises: Standing   External Rotation Right;Strengthening;20 reps;Theraband   Internal Rotation Right;5 reps  attempted with yellow band, to painful, stopped   Theraband Level (Shoulder Internal Rotation) Level 1 (Yellow)   Flexion Right;10 reps  wall walking, VC for using proper mechanics   ABduction Right;10 reps  scaption, wall climbs   Shoulder Exercises: ROM/Strengthening   UBE (Upper Arm Bike) L1 x  4' alt FWD/BWD   Shoulder Exercises: Stretch   Other Shoulder Stretches doorway stretch, low and  mid 2x30 sec   Modalities   Modalities Electrical Stimulation   Electrical Stimulation   Electrical Stimulation Location Rt shoulder   Electrical Stimulation Action modulated, home unit   Electrical Stimulation Parameters to tolerance   Electrical Stimulation Goals Pain                PT Education - 11/30/15 [phone]    Education provided Yes   Education Details stretches and scap stab   Person(s) Educated Patient   Methods Explanation;Demonstration;Handout   Comprehension Verbalized understanding;Returned demonstration          PT Short Term Goals - 11/30/15 0844    PT SHORT TERM GOAL #1   Title I with initial HEP ( 12/08/15)    Status Achieved   PT SHORT TERM GOAL #2   Title demo  Rt shoulder ROM WFL ( 12/08/15)    Status Achieved           PT Long Term Goals - 11/30/15 0844    PT LONG TERM GOAL #1   Title I with advanced HEP ( 02/02/16)     Time 12   Period Weeks   Status On-going   PT LONG TERM GOAL #2   Title verbalized understanding of shoulder mechanics as relates to injury prevention, healing and progression post surgery ( 02/02/16)    Time 12   Period Weeks   Status Partially Met   PT LONG TERM GOAL #3   Title demo Rt shoulder strength =/> 4/5 through out ( 02/02/16)    Time 12   Period Weeks   Status On-going   PT LONG TERM GOAL #4   Title improve FOTO =/< 42% limited (02/02/16)    Time 12   Period Weeks   Status On-going               Plan - 11/30/15 0859    Clinical Impression Statement Lindsay Moore is doing very well with her HEP, she has excellent shoulder ROM in supine. She has scapular weakness and shoulder weakness that lead to poor mechanics when she elevates her arm in standing. She has met her STGs and progressing to the others.     Rehab Potential Good   PT Frequency --  one to two more visits, per patient request.     PT Duration 12 weeks   PT Treatment/Interventions Ultrasound;Neuromuscular re-education;Patient/family education;Passive range of motion;Cryotherapy;Electrical Stimulation;Dry needling;Moist Heat;Therapeutic exercise;Manual techniques;Vasopneumatic Device   PT Next Visit Plan progress HEP as tolerates Rt shoulder , patient is limited with visits due to finances.     Consulted and Agree with Plan of Care Patient      Patient will benefit from skilled therapeutic intervention in order to improve the following deficits and impairments:  Postural dysfunction, Decreased strength, Pain, Increased edema, Decreased range of motion, Increased muscle spasms  Visit Diagnosis: Pain in right shoulder  Muscle weakness (generalized)     Problem List Patient Active Problem List   Diagnosis Date Noted  . Biceps tendinitis on  right 06/22/2015  . Serous otitis media 01/06/2015  . Postmenopausal bleeding 08/17/2014  . History of abnormal cervical Pap smear 06/19/2014  . Lumbar strain 01/06/2014  . Breast mass 07/11/2013  . History of colonic polyps 06/19/2013  . TIA (transient ischemic attack) 06/10/2013  . Essential hypertension, benign 06/10/2013  . Septal defect 06/10/2013  . 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  Right knee pain 06/10/2013  . Situational anxiety 06/10/2013  . Hyperlipidemia 06/10/2013    Jeral Pinch PT 11/30/2015, 9:02 AM  [facility] [place], [place], [address] Phone: [phone]   Fax:  [phone]  Name: Lindsay Moore MRN: [id number] Date of Birth: [birth year]

## 2015-11-30 NOTE — Patient Instructions (Signed)
Scapula Adduction With Pectorals, Low   Stand in doorframe with palms against frame and arms at 45. Lean forward and squeeze shoulder blades. Hold _30-45__ seconds.   Repeat __2_ times per session. Do __1-2_ sessions per day.  Scapula Adduction With Pectorals, Mid-Range   Stand in doorframe with palms against frame and arms at 90. Lean forward and squeeze shoulder blades. Hold _30-45__ seconds. Repeat __2_ times per session. Do _1__ sessions per day.  Strengthening: Horizontal Abduction - with External Rotation (Prone)    Holding _0-1___ pound weights, raise arms out from sides, pinching shoulder blades. Keep elbows straight, thumbs up. Repeat _10___ times per set. Do _2-3___ sets per session. Do _1___ sessions per day.  Progressive Resisted: Extension (Prone)    Holding _0-1___ pound weights, arms back, raise arms from floor, keeping elbows straight. Repeat __10__ times per set. Do _2-3___ sets per session. Do __1__ sessions per day.   Strengthening: Resisted Internal Rotation    Hold tubing in left hand, elbow at side and forearm out. Rotate forearm in across body. Repeat _10___ times per set. Do _1-3___ sets per session. Do __1__ sessions per day.  Strengthening: Resisted External Rotation - towel under his arm    Hold tubing in right hand, elbow at side, towel under elbow and forearm across body. Rotate forearm out. Repeat __10__ times per set. Do _1-2___ sets per session. Do ___1_ sessions per day.  Copyright  VHI. All rights reserved.

## 2015-12-14 ENCOUNTER — Encounter: Payer: Self-pay | Admitting: Obstetrics & Gynecology

## 2015-12-14 ENCOUNTER — Ambulatory Visit (INDEPENDENT_AMBULATORY_CARE_PROVIDER_SITE_OTHER): Payer: BC Managed Care – PPO | Admitting: Obstetrics & Gynecology

## 2015-12-14 VITALS — BP 150/89 | HR 99 | Resp 16 | Ht 64.5 in | Wt 156.0 lb

## 2015-12-14 DIAGNOSIS — N6310 Unspecified lump in the right breast, unspecified quadrant: Secondary | ICD-10-CM

## 2015-12-14 DIAGNOSIS — N63 Unspecified lump in breast: Secondary | ICD-10-CM | POA: Diagnosis not present

## 2015-12-14 NOTE — Progress Notes (Signed)
   Subjective:    Patient ID: Lindsay HatchetChristina G Berroa, female    DOB: 29-Jun-1956, 60 y.o.   MRN: [id number]  HPI 60 yo MW lady discovered a small breast lump recently. Her mammogram was normal 12/16.   Review of Systems     Objective:   Physical Exam [id number] Breathing, conversing, and ambulating normally Both breast appear normal as do her lymph nodes She has some fibrocystic changes bilaterally       Assessment & Plan:  Breast lump- schedule diagnostic right mammogram

## 2015-12-22 ENCOUNTER — Ambulatory Visit
Admission: RE | Admit: 2015-12-22 | Discharge: 2015-12-22 | Disposition: A | Discharge status: Home / Self Care | Payer: BC Managed Care – PPO | Source: Ambulatory Visit | Attending: Obstetrics & Gynecology | Admitting: Obstetrics & Gynecology

## 2015-12-22 DIAGNOSIS — N6310 Unspecified lump in the right breast, unspecified quadrant: Secondary | ICD-10-CM

## 2016-01-10 ENCOUNTER — Ambulatory Visit (INDEPENDENT_AMBULATORY_CARE_PROVIDER_SITE_OTHER): Payer: BC Managed Care – PPO | Admitting: Family Medicine

## 2016-01-10 ENCOUNTER — Ambulatory Visit (INDEPENDENT_AMBULATORY_CARE_PROVIDER_SITE_OTHER): Payer: BC Managed Care – PPO | Admitting: Physical Therapy

## 2016-01-10 VITALS — BP 138/83 | HR 102 | Wt 155.0 lb

## 2016-01-10 DIAGNOSIS — N63 Unspecified lump in unspecified breast: Secondary | ICD-10-CM

## 2016-01-10 DIAGNOSIS — M6281 Muscle weakness (generalized): Secondary | ICD-10-CM | POA: Diagnosis not present

## 2016-01-10 DIAGNOSIS — I10 Essential (primary) hypertension: Secondary | ICD-10-CM | POA: Diagnosis not present

## 2016-01-10 DIAGNOSIS — M25511 Pain in right shoulder: Secondary | ICD-10-CM

## 2016-01-10 NOTE — Patient Instructions (Signed)
Stop metoprolol and check daily BPs for the next week:                 .

## 2016-01-10 NOTE — Therapy (Addendum)
Lindsay Moore [place], [place], [address] Phone: [phone]   Fax:  [phone]  Physical Therapy Treatment  Patient Details  Name: Lindsay Moore MRN: [id number] Date of Birth: [birth year] Referring Provider: Dr Lindsay Moore  Encounter Date: 01/10/2016      PT End of Session - 01/10/16 0852    Visit Number 4   Number of Visits 6   Date for PT Re-Evaluation 02/02/16   PT Start Time 0850   PT Stop Time 0944   PT Time Calculation (min) 54 min      Past Medical History  Diagnosis Date  . Essential hypertension, benign 06/10/2013  . Hyperlipidemia 06/10/2013  . 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 Right knee pain 06/10/2013    Workers Comp: [facility] Dr. Berenice Moore   . 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 AC (acromioclavicular) joint bone spurs     Past Surgical History  Procedure Laterality Date  . Orthoscopic knee surgery  10/2012  . Gallbladder surgery    . Broken hip  2000  . Foot tendon surgery  2000  . Tubal ligation  1988  . Tonsillectomy  1959  . Bicep surgery Right     There were no vitals filed for this visit.      Subjective Assessment - 01/10/16 0851    Subjective Pt reports last weekend her grandson came running to her and she picked him up without thinking and ever since then it is hard for her to hook her bra.  She had immediate pain after, and now only has pain with certain motions. 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    Patient Stated Goals bowling, sewing and crafts.    Currently in Pain? No/denies at rest, has pain with lifting and moving her Rt UE            [facility] PT Assessment - 01/10/16 0001    Assessment   Medical Diagnosis s/p Rt shoulder scope, distal clavicle excision   Referring Provider Dr Lindsay Moore   Onset Date/Surgical Date 11/02/15   Hand Dominance Right   Next MD Visit 01/12/16   AROM   AROM Assessment Site Shoulder   Right/Left  Shoulder Right   Right Shoulder Extension 67 Degrees   Right Shoulder Flexion 150 Degrees  in standing with pain, sam e motion in supine   Right Shoulder ABduction 146 Degrees  in standing with pain   Right Shoulder Internal Rotation 90 Degrees   Right Shoulder External Rotation 85 Degrees   Strength   Strength Assessment Site Shoulder   Right/Left Shoulder Right   Right Shoulder Flexion 4+/5   Right Shoulder Extension 5/5   Right Shoulder ABduction 4+/5   Right Shoulder Internal Rotation 5/5   Right Shoulder External Rotation 4+/5                     [facility] Adult PT Treatment/Exercise - 01/10/16 0001    Exercises   Exercises Shoulder   Shoulder Exercises: ROM/Strengthening   UBE (Upper Arm Bike) L2x4' alt FWD/BWD   Modalities   Modalities Ultrasound;Electrical Stimulation;Moist Heat;Iontophoresis   Moist Heat Therapy   Number Minutes Moist Heat 15 Minutes   Moist Heat Location Shoulder  Rt   Electrical Stimulation   Electrical Stimulation Location Rt shoulder   Electrical Stimulation Action IFC   Electrical Stimulation Parameters to tolerance   Electrical Stimulation Goals Pain  Ultrasound   Ultrasound Location rt deltiod   Ultrasound Parameters 100%, 1.0 mHz, 1.5 w/cm2   Ultrasound Goals Pain  tone   Iontophoresis   Type of Iontophoresis Dexamethasone   Location rt biceps   Dose 1.0cc   Time 8 hrs patch 146m   Manual Therapy   Manual Therapy Soft tissue mobilization   Manual therapy comments very tender in her latera/anterior deltiod and bicep tendon area.    Soft tissue mobilization IASTM to Rt bicep and STW                  PT Short Term Goals - 01/10/16 0935    PT SHORT TERM GOAL #1   Title I with initial HEP ( 12/08/15)    Status Achieved   PT SHORT TERM GOAL #2   Title demo Rt shoulder ROM WFL ( 12/08/15)    Status Achieved           PT Long Term Goals - 01/10/16 02423   PT LONG TERM GOAL #1   Title I with advanced HEP (  02/02/16)     Time 12   Period Weeks   Status On-going   PT LONG TERM GOAL #2   Title verbalized understanding of shoulder mechanics as relates to injury prevention, healing and progression post surgery ( 02/02/16)    Status Achieved   PT LONG TERM GOAL #3   Title demo Rt shoulder strength =/> 4/5 through out ( 02/02/16)    PT LONG TERM GOAL #4   Title improve FOTO =/< 42% limited (02/02/16)    Time 12   Period Weeks   Status On-going               Plan - 01/10/16 0935    Clinical Impression Statement CGerald Stabshad been doing very well until she lifted up her grandchild, her ROM is slightly less than at her last visit due to Rt shoulder pain.  She is having point tenderness in her Rt bicep and trigger points in her deltoid.  She has met her strength goal for the Rt shoulder with break test however fatigues with repetitive activiies using the Rt UE. CGerald Stabswould benefit from continued PT however she is very concerned regarding the cost of attending.    Rehab Potential Good   PT Duration 12 weeks   PT Treatment/Interventions Ultrasound;Neuromuscular re-education;Patient/family education;Passive range of motion;Cryotherapy;Electrical Stimulation;Dry needling;Moist Heat;Therapeutic exercise;Manual techniques;Vasopneumatic Device;Iontophoresis 457mml Dexamethasone   PT Next Visit Plan She wishes to see MD on Wed and be advised by him.    Consulted and Agree with Plan of Care Patient      Patient will benefit from skilled therapeutic intervention in order to improve the following deficits and impairments:  Postural dysfunction, Decreased strength, Pain, Increased edema, Decreased range of motion, Increased muscle spasms  Visit Diagnosis: Pain in right shoulder  Muscle weakness (generalized)  Pain in joint of right shoulder     Problem List Patient Active Problem List   Diagnosis Date Noted  . Biceps tendinitis on right 06/22/2015  . Serous otitis media 01/06/2015  . Postmenopausal  bleeding 08/17/2014  . History of abnormal cervical Pap smear 06/19/2014  . Lumbar strain 01/06/2014  . Breast mass 07/11/2013  . History of colonic polyps 06/19/2013  . TIA (transient ischemic attack) 06/10/2013  . Essential hypertension, benign 06/10/2013  . Septal defect 06/10/2013  . 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 Right knee pain 06/10/2013  . Situational anxiety 06/10/2013  .  Hyperlipidemia 06/10/2013    Jeral Pinch PT  01/10/2016, 9:39 AM  [facility] [address] [place], [place], [address] Phone: [phone]   Fax:  [phone]  Name: Lindsay Moore MRN: [id number] Date of Birth: 27-Dec-1955   PHYSICAL THERAPY DISCHARGE SUMMARY  Visits from Start of Care: 4  Current functional level related to goals / functional outcomes: unknown   Remaining deficits: unknown   Education / Equipment: HEP Plan:                                                    Patient goals were partially met. Patient is being discharged due to not returning since the last visit.  ?????Pt wanted to return to MD and then let us know what the plan was.      Jeral Pinch, PT 05/29/16 8:02 AM

## 2016-01-11 ENCOUNTER — Encounter: Payer: Self-pay | Admitting: Family Medicine

## 2016-01-11 NOTE — Progress Notes (Addendum)
CC: Lindsay Moore is a 60 y.o. female is here for Hypertension   Subjective: HPI:  Recently had a mass in her right breast that was found to be benign. She's not complaining of any pain does not believe it's getting any larger and understands that she needs to have a repeat ultrasound in January 2018.   Follow essential hypertension: She is taking Maxzide on a daily basis with 100% compliance along with metoprolol. She denies any known side effects. All blood pressures at home are in the prehypertensive state never in stage I hypertension. She denies any chest pain shortness of breath orthopnea or peripheral edema    Review Of Systems Outlined In HPI  Past Medical History  Diagnosis Date  . Essential hypertension, benign 06/10/2013  . Hyperlipidemia 06/10/2013  . 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 Right knee pain 06/10/2013    Workers Comp: [facility] Dr. Luiz BlareGraves   . 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 AC (acromioclavicular) joint bone spurs     Past Surgical History  Procedure Laterality Date  . Orthoscopic knee surgery  10/2012  . Gallbladder surgery    . Broken hip  2000  . Foot tendon surgery  2000  . Tubal ligation  1988  . Tonsillectomy  1959  . Bicep surgery Right    Family History  Problem Relation Age of Onset  . Alcoholism Father   . Lung cancer Father   . Diabetes      grandmother  . Hyperlipidemia Mother   . Hypertension Mother   . Stroke      grandmother    Social History   Social History  . Marital Status: Married    Spouse Name: N/A  . Number of Children: N/A  . Years of Education: N/A   Occupational History  . teacher    Social History Main Topics  . Smoking status: Former Smoker -- 1.00 packs/day for 25 years    Types: Cigarettes  . Smokeless tobacco: Never Used  . Alcohol Use: 0.6 - 1.2 oz/week    1-2 Standard drinks or equivalent per week  . Drug Use: No  . Sexual Activity:    Partners: Male    Birth Control/ Protection: Surgical    Other Topics Concern  . Not on file   Social History Narrative     Objective: BP 138/83 mmHg  Pulse 102  Wt 155 lb (70.308 kg)  General: Alert and Oriented, No Acute Distress HEENT: Pupils equal, round, reactive to light. Conjunctivae clear.  Moist mucous membranes. Lungs: Clear to auscultation bilaterally, no wheezing/ronchi/rales.  Comfortable work of breathing. Good air movement. Cardiac: Regular rate and rhythm. Normal S1/S2.  No murmurs, rubs, nor gallops.   Extremities: No peripheral edema.  Strong peripheral pulses.  Mental Status: No depression, anxiety, nor agitation. Skin: Warm and dry.  Assessment & Plan: [name] was seen today for hypertension.  Diagnoses and all orders for this visit:  Essential hypertension, benign  Breast mass   Essential hypertension: Controlled continue Maxzide and metoprolol Breast mass: She was reminded about follow-up with radiology in 5 months.  25 minutes spent face-to-face during visit today of which at least 50% was counseling or coordinating care regarding: 1. Essential hypertension, benign   2. Breast mass       Return in about 3 months (around 04/11/2016) for BP.

## 2016-03-20 ENCOUNTER — Other Ambulatory Visit: Payer: Self-pay | Admitting: Family Medicine

## 2016-03-23 ENCOUNTER — Telehealth: Payer: Self-pay | Admitting: Family Medicine

## 2016-03-23 NOTE — Telephone Encounter (Signed)
Patient states she will follow up on this with Dr Denyse Amassorey at her December visit.

## 2016-03-23 NOTE — Telephone Encounter (Signed)
Please call patient and let her know that we received notification from her insurance that she would benefit from a statin. I did review the information in her chart and with her prior history of TIA it would reduce future risk of stroke and heart disease to be on a cholesterol-lowering medication at bedtime. If she is willing to consider this and please let me know and I can send over a prescription to the pharmacy. I did not see this on her intolerance list. I do see that she takes a baby aspirin as well which is also helpful.

## 2016-05-01 ENCOUNTER — Other Ambulatory Visit (HOSPITAL_COMMUNITY): Payer: Self-pay | Admitting: Obstetrics & Gynecology

## 2016-05-01 DIAGNOSIS — N63 Unspecified lump in unspecified breast: Secondary | ICD-10-CM

## 2016-05-10 ENCOUNTER — Telehealth: Payer: Self-pay | Admitting: Family Medicine

## 2016-05-10 ENCOUNTER — Ambulatory Visit (INDEPENDENT_AMBULATORY_CARE_PROVIDER_SITE_OTHER): Payer: BC Managed Care – PPO | Admitting: Family Medicine

## 2016-05-10 VITALS — BP 125/80 | HR 101 | Temp 98.1°F

## 2016-05-10 DIAGNOSIS — Z23 Encounter for immunization: Secondary | ICD-10-CM

## 2016-05-10 NOTE — Telephone Encounter (Signed)
Dr Salena SanerTrula Ore: Lindsay Moore & her husband Rolene ArbourJeff Wedel want to become your patients..they were recommended by Dr Rexene EdisonH. Thank you

## 2016-05-10 NOTE — Telephone Encounter (Signed)
Hi Dr C: Please let me know your response at you earliest convenience.  Thank you.

## 2016-05-10 NOTE — Progress Notes (Signed)
Patient came into clinic today for flu vaccination. Patient tolerated injection of flu immunization in Left deltoid well, with no immediate complications. Advised to contact our office with any questions/concerns.  

## 2016-05-10 NOTE — Telephone Encounter (Signed)
My clinic is pretty full. I will defer.  Thanks for asking.

## 2016-06-06 ENCOUNTER — Ambulatory Visit
Admission: RE | Admit: 2016-06-06 | Discharge: 2016-06-06 | Disposition: A | Discharge status: Home / Self Care | Payer: BC Managed Care – PPO | Source: Ambulatory Visit | Attending: Obstetrics & Gynecology | Admitting: Obstetrics & Gynecology

## 2016-06-06 ENCOUNTER — Ambulatory Visit: Admission: RE | Admit: 2016-06-06 | Payer: BC Managed Care – PPO | Source: Ambulatory Visit

## 2016-06-06 ENCOUNTER — Other Ambulatory Visit (HOSPITAL_COMMUNITY): Payer: Self-pay | Admitting: Obstetrics & Gynecology

## 2016-06-06 DIAGNOSIS — N63 Unspecified lump in unspecified breast: Secondary | ICD-10-CM

## 2016-06-20 ENCOUNTER — Encounter: Payer: BC Managed Care – PPO | Admitting: Family Medicine

## 2016-06-20 ENCOUNTER — Encounter: Payer: BC Managed Care – PPO | Admitting: Osteopathic Medicine

## 2016-06-22 ENCOUNTER — Ambulatory Visit (INDEPENDENT_AMBULATORY_CARE_PROVIDER_SITE_OTHER): Payer: BC Managed Care – PPO | Admitting: Osteopathic Medicine

## 2016-06-22 ENCOUNTER — Encounter: Payer: Self-pay | Admitting: Osteopathic Medicine

## 2016-06-22 VITALS — BP 140/84 | HR 93 | Ht 64.5 in | Wt 149.0 lb

## 2016-06-22 DIAGNOSIS — R21 Rash and other nonspecific skin eruption: Secondary | ICD-10-CM | POA: Diagnosis not present

## 2016-06-22 DIAGNOSIS — I10 Essential (primary) hypertension: Secondary | ICD-10-CM

## 2016-06-22 DIAGNOSIS — F418 Other specified anxiety disorders: Secondary | ICD-10-CM

## 2016-06-22 MED ORDER — DIAZEPAM 5 MG PO TABS: 5.0000 mg | ORAL_TABLET | Freq: Every day | ORAL | 0 refills | Status: DC | PRN

## 2016-06-22 NOTE — Patient Instructions (Signed)
Lab orders are in place for you to get blood drawn at your convenience prior to your annual physical in the spring.   Let me know if there is anything else I can do for you and your mom!

## 2016-06-22 NOTE — Progress Notes (Signed)
HPI: Lindsay Moore is a 60 y.o. female  who presents to [facility] today, 06/22/16,  for chief complaint of:  Chief Complaint  Patient presents with  . Annual Exam    Thought she was due for annual physical but this was done 11 months ago  . Anxiety    Situational anxiety, would like anxiolytic  . Otalgia    Would like left ear looked at, feeling of fullness     Situational anxiety: Gets really nervous on planes, upcoming vacation to [place], requests anxiolytic. Had previously been on Valium for MRI anxiety and this did well for her. Prior to that had a prescription she thinks for it but doesn't remember if she recheck it for anything. There are a few other scripts in the chart for her for clonazepam and alprazolam but she states she never filled these after reading about the side effects. [place] controlled substance database reviewed, consistent with patient history of Valium dispensation's in the last few years  Left ear discomfort: Rash at the opening of canal, patient states that she has had this before, diagnosed with psoriasis/eczema, has some leftover steroid cream from previous PCP.  Essential hypertension: Reports nervous at doctor's offices, blood pressure borderline, no chest pain, pressure, shortness of breath.  Patient is accompanied by husband who assists with history-taking.   Past medical, surgical, social and family history reviewed: Patient Active Problem List   Diagnosis Date Noted  . Biceps tendinitis on right 06/22/2015  . Serous otitis media 01/06/2015  . Postmenopausal bleeding 08/17/2014  . History of abnormal cervical Pap smear 06/19/2014  . Lumbar strain 01/06/2014  . Breast mass 07/11/2013  . History of colonic polyps 06/19/2013  . TIA (transient ischemic attack) 06/10/2013  . Essential hypertension, benign 06/10/2013  . Septal defect 06/10/2013  . 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 Right knee pain 06/10/2013   . Situational anxiety 06/10/2013  . Hyperlipidemia 06/10/2013   Past Surgical History:  Procedure Laterality Date  . bicep surgery Right   . broken hip  2000  . FOOT TENDON SURGERY  2000  . GALLBLADDER SURGERY    . orthoscopic knee surgery  10/2012  . TONSILLECTOMY  1959  . TUBAL LIGATION  1988   Social History  Substance Use Topics  . Smoking status: Former Smoker    Packs/day: 1.00    Years: 25.00    Types: Cigarettes  . Smokeless tobacco: Never Used  . Alcohol use 0.6 - 1.2 oz/week    1 - 2 Standard drinks or equivalent per week   Family History  Problem Relation Age of Onset  . Alcoholism Father   . Lung cancer Father   . Diabetes      grandmother  . Hyperlipidemia Mother   . Hypertension Mother   . Stroke      grandmother     Current medication list and allergy/intolerance information reviewed:   Current Outpatient Prescriptions on File Prior to Visit  Medication Sig Dispense Refill  . aspirin EC 81 MG tablet Take 1 tablet (81 mg total) by mouth daily. 150 tablet 2  . Calcium Carb-Cholecalciferol (CALCIUM + D3 PO) Take by mouth.    Marland Kitchen. KRILL OIL PO Take by mouth.    . Multiple Vitamin (MULTIVITAMIN) tablet Take 1 tablet by mouth daily.    . ranitidine (ZANTAC) 150 MG tablet Take 150 mg by mouth 2 (two) times daily. Reported on 12/14/2015    . Thiamine HCl (VITAMIN B-1  PO) Take by mouth.    . triamterene-hydrochlorothiazide (MAXZIDE-25) 37.5-25 MG tablet TAKE ONE TABLET BY MOUTH ONCE DAILY 90 tablet 2   No current facility-administered medications on file prior to visit.    Allergies  Allergen Reactions  . Amlodipine     edema  . Bee Venom   . Ibuprofen     REACTION: breathing difficulty  . Lisinopril     tongue swelling      Review of Systems:  Constitutional: No recent illness  HEENT: No  headache, no vision change  Cardiac: No  chest pain, No  pressure, No palpitations  Respiratory:  No  shortness of breath. No  Cough  Gastrointestinal: No   abdominal pain, no change on bowel habits  Musculoskeletal: No new myalgia/arthralgia  Skin: No  Rash  Hem/Onc: No  easy bruising/bleeding, No  abnormal lumps/bumps  Neurologic: No  weakness, No  Dizziness  Psychiatric: No  concerns with depression, + concerns with anxiety  Exam:  BP 140/84   Pulse 93   Ht 5' 4.5" (1.638 m)   Wt 149 lb (67.6 kg)   BMI 25.18 kg/m   Constitutional: VS see above. General Appearance: alert, well-developed, well-nourished, NAD  Eyes: Normal lids and conjunctive, non-icteric sclera  Ears, Nose, Mouth, Throat: MMM, Normal external inspection ears/nares/mouth/lips/gums. Dry skin and opening of canal to left ear, no drainage/ulceration. Tympanic memory normal. Internal ear canal normal.  Neck: No masses, trachea midline.   Respiratory: Normal respiratory effort. no wheeze, no rhonchi, no rales  Cardiovascular: S1/S2 normal, no murmur, no rub/gallop auscultated. RRR.   Musculoskeletal: Gait normal. Symmetric and independent movement of all extremities  Neurological: Normal balance/coordination. No tremor.  Skin: warm, dry, intact.   Psychiatric: Normal judgment/insight. Normal mood and affect. Oriented x3.     ASSESSMENT/PLAN:   Situational anxiety - Limited refill on Valium prescribed,  Essential hypertension, benign - Plan: CBC with Differential/Platelet, COMPLETE METABOLIC PANEL WITH GFR, Lipid panel, TSH  Rash and nonspecific skin eruption - Can use topical steroid, let me know if needs refills on this. For feeling of fullness can try nasal steroid    Patient Instructions  Lab orders are in place for you to get blood drawn at your convenience prior to your annual physical in the spring.   Let me know if there is anything else I can do for you and your mom!     Visit summary with medication list and pertinent instructions was printed for patient to review. All questions at time of visit were answered - patient instructed to contact  office with any additional concerns. ER/RTC precautions were reviewed with the patient. Follow-up plan: Return in about 4 months (around 10/21/2016) for ANNUAL PHYSICAL & LABS.

## 2016-07-15 ENCOUNTER — Emergency Department
Admission: EM | Admit: 2016-07-15 | Discharge: 2016-07-15 | Disposition: A | Discharge status: Home / Self Care | Payer: BC Managed Care – PPO | Source: Home / Self Care | Attending: Family Medicine | Admitting: Family Medicine

## 2016-07-15 ENCOUNTER — Encounter: Payer: Self-pay | Admitting: Emergency Medicine

## 2016-07-15 DIAGNOSIS — H6012 Cellulitis of left external ear: Secondary | ICD-10-CM

## 2016-07-15 DIAGNOSIS — H60392 Other infective otitis externa, left ear: Secondary | ICD-10-CM

## 2016-07-15 MED ORDER — DOXYCYCLINE HYCLATE 100 MG PO CAPS: 100.0000 mg | ORAL_CAPSULE | Freq: Two times a day (BID) | ORAL | 0 refills | Status: DC

## 2016-07-15 MED ORDER — NEOMYCIN-POLYMYXIN-HC 3.5-10000-1 OT SUSP: 4.0000 [drp] | Freq: Three times a day (TID) | OTIC | 0 refills | Status: DC

## 2016-07-15 NOTE — ED Provider Notes (Signed)
CSN: [id number]     Arrival date & time 07/15/16  1532 History   First MD Initiated Contact with Patient 07/15/16 1608     Chief Complaint  Patient presents with  . Otalgia   (Consider location/radiation/quality/duration/timing/severity/associated sxs/prior Treatment) HPI Lindsay Moore is a 61 y.o. female presenting to UC with c/o Left ear pain and redness for about 2 days.  She initially thought it was due to psoriasis and had been putting on a prescribed steroid cream she believes is triamcinolone but only minimal relief. She is concerned as she has noticed swelling today.  Pain is aching and sore, 5/10.  Cold-like symptoms with mild congestion last week but she feels well other than her ear today.  Denies fever, chills, n/v/d.    Past Medical History:  Diagnosis Date  . AC (acromioclavicular) joint bone spurs   . Essential hypertension, benign 06/10/2013  . 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 Hyperlipidemia 06/10/2013  . 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 Right knee pain 06/10/2013   Workers Comp: [facility] Dr. Luiz BlareGraves    Past Surgical History:  Procedure Laterality Date  . bicep surgery Right   . broken hip  2000  . FOOT TENDON SURGERY  2000  . GALLBLADDER SURGERY    . orthoscopic knee surgery  10/2012  . TONSILLECTOMY  1959  . TUBAL LIGATION  1988   Family History  Problem Relation Age of Onset  . Alcoholism Father   . Lung cancer Father   . Diabetes      grandmother  . Hyperlipidemia Mother   . Hypertension Mother   . Stroke      grandmother   Social History  Substance Use Topics  . Smoking status: Former Smoker    Packs/day: 1.00    Years: 25.00    Types: Cigarettes  . Smokeless tobacco: Never Used  . Alcohol use 0.6 - 1.2 oz/week    1 - 2 Standard drinks or equivalent per week   OB History    Gravida Para Term Preterm AB Living   6 5   2   5    SAB TAB Ectopic Multiple Live Births                 Review of Systems  Constitutional: Negative for  chills and fever.  HENT: Positive for congestion ( minimal) and ear pain (Left). Negative for sore throat, trouble swallowing and voice change.   Respiratory: Negative for cough and shortness of breath.   Cardiovascular: Negative for chest pain and palpitations.  Gastrointestinal: Negative for abdominal pain, diarrhea, nausea and vomiting.  Musculoskeletal: Negative for arthralgias, back pain and myalgias.  Skin: Negative for rash.    Allergies  Amlodipine; Bee venom; Ibuprofen; and Lisinopril  Home Medications   Prior to Admission medications   Medication Sig Start Date End Date Taking? Authorizing Provider  aspirin EC 81 MG tablet Take 1 tablet (81 mg total) by mouth daily. 06/10/13   Laren BoomSean Hommel, DO  Calcium Carb-Cholecalciferol (CALCIUM + D3 PO) Take by mouth.    Historical Provider, MD  doxycycline (VIBRAMYCIN) 100 MG capsule Take 1 capsule (100 mg total) by mouth 2 (two) times daily. One po bid x 7 days 07/15/16   Junius FinnerErin O'Malley, PA-C  Multiple Vitamin (MULTIVITAMIN) tablet Take 1 tablet by mouth daily.    Historical Provider, MD  neomycin-polymyxin-hydrocortisone (CORTISPORIN) 3.5-10000-1 otic suspension Place 4 drops into the left ear 3 (three) times daily. For 7 days  07/15/16   Junius Finner, PA-C  ranitidine (ZANTAC) 150 MG tablet Take 150 mg by mouth 2 (two) times daily. Reported on 12/14/2015    Historical Provider, MD  Thiamine HCl (VITAMIN B-1 PO) Take by mouth.    Historical Provider, MD  triamterene-hydrochlorothiazide (MAXZIDE-25) 37.5-25 MG tablet TAKE ONE TABLET BY MOUTH ONCE DAILY 03/21/16   Rodolph Bong, MD   Meds Ordered and Administered this Visit  Medications - No data to display  BP 167/92 (BP Location: Left Arm)   Pulse 105   Temp 97.7 F (36.5 C) (Oral)   Ht 5' 4.5" (1.638 m)   Wt 150 lb 8 oz (68.3 kg)   SpO2 97%   BMI 25.43 kg/m  No data found.   Physical Exam  Constitutional: She is oriented to person, place, and time. She appears well-developed and  well-nourished. No distress.  HENT:  Head: Normocephalic and atraumatic.  Right Ear: Tympanic membrane normal.  Left Ear: Tympanic membrane normal. There is swelling and tenderness. No drainage. Tympanic membrane is not erythematous and not bulging.  Ears:  Mild edema with erythema around ear canal and earlobe. Tender. No bleeding or drainage. Scant amount of dried skin.   Eyes: EOM are normal.  Neck: Normal range of motion. Neck supple.  Cardiovascular: Normal rate and regular rhythm.   Pulmonary/Chest: Effort normal and breath sounds normal. No stridor. No respiratory distress. She has no wheezes. She has no rales.  Musculoskeletal: Normal range of motion.  Lymphadenopathy:    She has cervical adenopathy (Left pre- and post- auricular ).  Neurological: She is alert and oriented to person, place, and time.  Skin: Skin is warm and dry. She is not diaphoretic.  Psychiatric: She has a normal mood and affect. Her behavior is normal.  Nursing note and vitals reviewed.   Urgent Care Course   Clinical Course     Procedures (including critical care time)  Labs Review Labs Reviewed - No data to display  Imaging Review No results found.   MDM   1. Other infective acute otitis externa of left ear   2. Cellulitis of left ear    Pt c/o Left ear pain redness and swelling for about 2-3 days.  Exam c/w otitis externa and concerning for early cellulitis.  Rx: cortisporin ear drops and doxycycline   Encouraged f/u with PCP in 4-5 days if not improving, sooner if worsening.     Junius Finner, PA-C 07/15/16 1711

## 2016-07-15 NOTE — ED Triage Notes (Signed)
Pt c/o swollen area behind left ear, redness and pain

## 2016-07-18 ENCOUNTER — Ambulatory Visit (INDEPENDENT_AMBULATORY_CARE_PROVIDER_SITE_OTHER): Payer: BC Managed Care – PPO | Admitting: Osteopathic Medicine

## 2016-07-18 ENCOUNTER — Encounter: Payer: Self-pay | Admitting: Osteopathic Medicine

## 2016-07-18 VITALS — BP 146/83 | HR 100 | Ht 64.5 in | Wt 149.0 lb

## 2016-07-18 DIAGNOSIS — L03211 Cellulitis of face: Secondary | ICD-10-CM | POA: Diagnosis not present

## 2016-07-18 MED ORDER — PREDNISONE 50 MG PO TABS: 25.0000 mg | ORAL_TABLET | Freq: Two times a day (BID) | ORAL | 0 refills | Status: DC

## 2016-07-18 NOTE — Progress Notes (Signed)
HPI: Lindsay Moore is a 61 y.o. female who [facility] [place] [facility] for chief complaint of:  Chief Complaint  Patient presents with  . Rash    LEFT EAR    Acute Illness: . Context:  Rash seems to have spread . Location: L ear area . Quality: rash - redness, itching, tenderness, swelling under L ear . Assoc signs/symptoms: see ROS . Modifying factors: has tried the following OTC/Rx medications: see below   UC records reviewed: seen 3 days ago for rash on external ear, cervical adenopathy pre- and post-auricular noted. Treated for Dx otitis externa and cellulitis w/ corticosporin drops and oral doxycycline.    Past medical, social and family history reviewed. Current medications and allergies reviewed.     Review of Systems:  Constitutional: No  fever/chills  HEENT: No  headache, No  sore throat, No  swollen glands  Cardiovascular: No chest pain  Respiratory:No  cough, No  shortness of breath  Gastrointestinal: No  nausea, No  vomiting,  No  diarrhea  Musculoskeletal:   No  myalgia/arthralgia  Skin/Integument:  Yes  rash   Exam:  BP (!) 146/83   Pulse 100   Ht 5' 4.5" (1.638 m)   Wt 149 lb (67.6 kg)   BMI 25.18 kg/m   Constitutional: VSS, see above. General Appearance: alert, well-developed, well-nourished, NAD  Eyes: Normal lids and conjunctive, non-icteric sclera, PERRLA  Ears, Nose, Mouth, Throat: Normal external inspection ears/nares/mouth/lips/gums, normal TM, MMM; posterior pharynx without erythema, without exudate, nasal mucosa normal. Normal gums.   Neck: No masses, trachea midline. abnormal - (+) ant/post auricular lymphadenopathy and mild subq edema  Respiratory: Normal respiratory effort. No  wheeze/rhonchi/rales  Cardiovascular: S1/S2 normal, no murmur/rub/gallop auscultated. RRR.   ASSESSMENT/PLAN: Overall improving and no trismus or signs c/w mastoiditis or other deep infection. Trial prednisone,  continue abx, precautions reviewed, trial Tylenol prn pain. Consider CT if worse. Appreciate second opinion in the office from Dr Benjamin Stainhekkekandam.   Cellulitis of face - Plan: predniSONE (DELTASONE) 50 MG tablet     Patient Instructions  I think we can watch this for a few more days on the antibiotics while we add prednisone to help the inflammation and the itching. It this gets more painful/larger, if you develop fever, if it hurts to open your mouth even to talk or chew, please see us ASAP!     Visit summary was printed for the patient with medications and pertinent instructions for patient to review. ER/RTC precautions reviewed. All questions answered. Return in about 1 week (around 07/25/2016) for recheck ear/face .

## 2016-07-18 NOTE — Patient Instructions (Signed)
I think we can watch this for a few more days on the antibiotics while we add prednisone to help the inflammation and the itching. It this gets more painful/larger, if you develop fever, if it hurts to open your mouth even to talk or chew, please see us ASAP!

## 2016-08-31 ENCOUNTER — Encounter: Payer: Self-pay | Admitting: Sports Medicine

## 2016-08-31 ENCOUNTER — Ambulatory Visit (INDEPENDENT_AMBULATORY_CARE_PROVIDER_SITE_OTHER): Payer: BC Managed Care – PPO

## 2016-08-31 ENCOUNTER — Ambulatory Visit (INDEPENDENT_AMBULATORY_CARE_PROVIDER_SITE_OTHER): Payer: BC Managed Care – PPO | Admitting: Sports Medicine

## 2016-08-31 DIAGNOSIS — M170 Bilateral primary osteoarthritis of knee: Secondary | ICD-10-CM | POA: Insufficient documentation

## 2016-08-31 DIAGNOSIS — M1711 Unilateral primary osteoarthritis, right knee: Secondary | ICD-10-CM | POA: Diagnosis not present

## 2016-08-31 HISTORY — DX: Bilateral primary osteoarthritis of knee: M17.0

## 2016-08-31 MED ORDER — DICLOFENAC SODIUM 75 MG PO TBEC: 75.0000 mg | DELAYED_RELEASE_TABLET | Freq: Two times a day (BID) | ORAL | 3 refills | Status: DC
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
## 2016-08-31 NOTE — Assessment & Plan Note (Addendum)
Right knee injection, I do want some baseline x-rays. I'm also going to work on getting Orthovisc approved. Return to see me one Orthovisc is approved to start injections, right-sided only. She has taken aspirin without any difficulties, I'm going to start diclofenac.

## 2016-09-06 ENCOUNTER — Telehealth: Payer: Self-pay | Admitting: Sports Medicine

## 2016-09-06 NOTE — Telephone Encounter (Signed)
Submitted for approval on Orthovisc. Awaiting confirmation.  

## 2016-09-06 NOTE — Telephone Encounter (Signed)
-----   Message from Thomas J Thekkekandam, MD sent at 08/31/2016  3:43 PM EST ----- Orthovisc approval please (if Monica Bectonyou can with the current portal issues :-P), right knee, x-ray confirmed arthritis. ___________________________________________ Ihor Austinhomas J. Benjamin Stainhekkekandam, M.D., ABFM., CAQSM. Primary Care and Sports Medicine [facility]  Adjunct Instructor of Family Medicine  [employer]

## 2016-10-03 ENCOUNTER — Other Ambulatory Visit: Payer: Self-pay

## 2016-10-03 DIAGNOSIS — I10 Essential (primary) hypertension: Secondary | ICD-10-CM

## 2016-10-04 ENCOUNTER — Emergency Department
Admission: EM | Admit: 2016-10-04 | Discharge: 2016-10-04 | Disposition: A | Discharge status: Home / Self Care | Payer: BC Managed Care – PPO | Source: Home / Self Care | Attending: Family Medicine | Admitting: Family Medicine

## 2016-10-04 ENCOUNTER — Encounter: Payer: Self-pay | Admitting: Emergency Medicine

## 2016-10-04 ENCOUNTER — Emergency Department (INDEPENDENT_AMBULATORY_CARE_PROVIDER_SITE_OTHER): Payer: BC Managed Care – PPO

## 2016-10-04 DIAGNOSIS — M79642 Pain in left hand: Secondary | ICD-10-CM

## 2016-10-04 DIAGNOSIS — R52 Pain, unspecified: Secondary | ICD-10-CM | POA: Diagnosis not present

## 2016-10-04 DIAGNOSIS — S6392XA Sprain of unspecified part of left wrist and hand, initial encounter: Secondary | ICD-10-CM

## 2016-10-04 NOTE — Discharge Instructions (Signed)
Apply ice pack for 20 to 30 minutes every 1 to 2 hours today and tomorrow.  Elevate.  Wear Ace wrap until swelling decreases.  Wear brace for about 2 weeks.  Begin range of motion and stretching exercises in about 5 days as tolerated.  May take Tylenol as needed for pain.

## 2016-10-04 NOTE — ED Triage Notes (Signed)
Left hand injury from fall today
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
## 2016-10-05 LAB — CBC WITH DIFFERENTIAL/PLATELET
BASOS PCT: 1 %
Basophils Absolute: 63 cells/uL (ref 0–200)
EOS PCT: 5 %
Eosinophils Absolute: 315 cells/uL (ref 15–500)
HCT: 42.7 % (ref 35.0–45.0)
Hemoglobin: 13.8 g/dL (ref 11.7–15.5)
LYMPHS PCT: 39 %
Lymphs Abs: 2457 cells/uL (ref 850–3900)
MCH: 28.4 pg (ref 27.0–33.0)
MCHC: 32.3 g/dL (ref 32.0–36.0)
MCV: 87.9 fL (ref 80.0–100.0)
MONOS PCT: 8 %
MPV: 10.4 fL (ref 7.5–12.5)
Monocytes Absolute: 504 cells/uL (ref 200–950)
NEUTROS ABS: 2961 {cells}/uL (ref 1500–7800)
Neutrophils Relative %: 47 %
PLATELETS: 319 10*3/uL (ref 140–400)
RBC: 4.86 MIL/uL (ref 3.80–5.10)
RDW: 13.5 % (ref 11.0–15.0)
WBC: 6.3 10*3/uL (ref 3.8–10.8)

## 2016-10-05 LAB — COMPLETE METABOLIC PANEL WITH GFR
ALT: 26 U/L (ref 6–29)
AST: 25 U/L (ref 10–35)
Albumin: 4.2 g/dL (ref 3.6–5.1)
Alkaline Phosphatase: 87 U/L (ref 33–130)
BILIRUBIN TOTAL: 0.5 mg/dL (ref 0.2–1.2)
BUN: 19 mg/dL (ref 7–25)
CHLORIDE: 103 mmol/L (ref 98–110)
CO2: 28 mmol/L (ref 20–31)
CREATININE: 1.16 mg/dL — AB (ref 0.50–0.99)
Calcium: 9.7 mg/dL (ref 8.6–10.4)
GFR, Est African American: 58 mL/min — ABNORMAL LOW (ref >=60)
GFR, Est Non African American: 51 mL/min — ABNORMAL LOW (ref >=60)
GLUCOSE: 93 mg/dL (ref 65–99)
Potassium: 3.9 mmol/L (ref 3.5–5.3)
SODIUM: 141 mmol/L (ref 135–146)
TOTAL PROTEIN: 6.7 g/dL (ref 6.1–8.1)

## 2016-10-05 LAB — TSH: TSH: 1.77 m[IU]/L

## 2016-10-05 LAB — LIPID PANEL
Cholesterol: 247 mg/dL — ABNORMAL HIGH (ref <200)
HDL: 74 mg/dL (ref >50)
LDL CALC: 140 mg/dL — AB (ref <100)
Total CHOL/HDL Ratio: 3.3 Ratio (ref <5.0)
Triglycerides: 163 mg/dL — ABNORMAL HIGH (ref <150)
VLDL: 33 mg/dL — AB (ref <30)

## 2016-10-10 ENCOUNTER — Ambulatory Visit (INDEPENDENT_AMBULATORY_CARE_PROVIDER_SITE_OTHER): Payer: BC Managed Care – PPO | Admitting: Osteopathic Medicine

## 2016-10-10 VITALS — BP 139/81 | HR 110 | Temp 98.0°F | Wt 152.0 lb

## 2016-10-10 DIAGNOSIS — Z0000 Encounter for general adult medical examination without abnormal findings: Secondary | ICD-10-CM

## 2016-10-10 DIAGNOSIS — I10 Essential (primary) hypertension: Secondary | ICD-10-CM | POA: Diagnosis not present

## 2016-10-10 DIAGNOSIS — Z23 Encounter for immunization: Secondary | ICD-10-CM | POA: Diagnosis not present

## 2016-10-10 DIAGNOSIS — Z8781 Personal history of (healed) traumatic fracture: Secondary | ICD-10-CM | POA: Diagnosis not present

## 2016-10-10 MED ORDER — TETANUS-DIPHTH-ACELL PERTUSSIS 5-2.5-18.5 LF-MCG/0.5 IM SUSP: 0.5000 mL | Freq: Once | INTRAMUSCULAR | Status: DC

## 2016-10-10 NOTE — Patient Instructions (Addendum)
Plan: 1. Will be due for Shingles vaccine - can get this here! Come back in one week, we will have it in stock at that time  2. Bone density test for osteoporosis - you should get a call about scheduling this  3. Plan for recheck blood pressure in the office in 6 months as long as recheck at your visit for shingles shot is ok  4. Plan to repeat annual exam and routine labs in one year
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
## 2016-10-10 NOTE — Progress Notes (Signed)
Pt is here for a Annual PE.

## 2016-10-17 ENCOUNTER — Ambulatory Visit (INDEPENDENT_AMBULATORY_CARE_PROVIDER_SITE_OTHER): Payer: BC Managed Care – PPO | Admitting: Osteopathic Medicine

## 2016-10-17 VITALS — BP 141/83 | HR 102 | Temp 97.9°F | Wt 151.0 lb

## 2016-10-17 DIAGNOSIS — Z23 Encounter for immunization: Secondary | ICD-10-CM

## 2016-10-17 NOTE — Progress Notes (Signed)
   Subjective:    Patient ID: Lindsay Moore, female    DOB: [birth year], 61 y.o.   MRN: [id number]  Lindsay Moore is here for a Shingrix vaccine.       Review of Systems     Objective:   Physical Exam        Assessment & Plan:  Patient tolerated injection well without complications. Patient advised to schedule next injection 2-3 months from today.

## 2016-10-18 NOTE — Telephone Encounter (Signed)
Received the following information from [employer] benefits investigation:   Orthovisc is covered.  After deductible and OOP have been met insurance will cover 100% of the allowable amount.  Called and went over information with Pt. She has not met her deductible or OOP yet. She will let us know when she is ready to start.

## 2016-10-19 NOTE — Telephone Encounter (Signed)
Received the following information from [employer] benefits investigation:   Orthovisc is covered.  After deductible and OOP have been met insurance will cover 100% of the allowable amount. Patient deduct is $1250. Met $0. Until Deduct is met Insurance will cover 80%. and Copay of $85 for medication and, for administration and product if specialist office visit billed. PA not required to buy and bill or pharmacy option.  Please submit a script from the portal if you would like us to transfer to the pharmacy  Went over estimated OOP cost with Pt, she will think on it and let us know.  

## 2016-10-24 ENCOUNTER — Ambulatory Visit (INDEPENDENT_AMBULATORY_CARE_PROVIDER_SITE_OTHER): Payer: BC Managed Care – PPO

## 2016-10-24 DIAGNOSIS — M8588 Other specified disorders of bone density and structure, other site: Secondary | ICD-10-CM

## 2016-10-26 ENCOUNTER — Telehealth: Payer: Self-pay

## 2016-10-26 NOTE — Telephone Encounter (Signed)
Patient called stated that she had bone density test done on 10/24/2016. Patient would like to know her results, please advise. [name],CMA

## 2016-10-27 NOTE — Telephone Encounter (Signed)
See result notes. 

## 2016-11-16 ENCOUNTER — Encounter: Payer: Self-pay | Admitting: Sports Medicine

## 2016-11-16 ENCOUNTER — Ambulatory Visit (INDEPENDENT_AMBULATORY_CARE_PROVIDER_SITE_OTHER): Payer: BC Managed Care – PPO | Admitting: Sports Medicine

## 2016-11-16 DIAGNOSIS — M170 Bilateral primary osteoarthritis of knee: Secondary | ICD-10-CM | POA: Diagnosis not present

## 2016-11-16 NOTE — Progress Notes (Signed)
   Procedure: Real-time Ultrasound Guided aspiration/injection of right knee Device: GE Logiq E  Verbal informed consent obtained.  Time-out conducted.  Noted no overlying erythema, induration, or other signs of local infection.  Skin prepped in a sterile fashion.  Local anesthesia: Topical Ethyl chloride.  With sterile technique and under real time ultrasound guidance:  Using 18-gauge needle aspirated 14 mL straw-colored fluid, syringe switched and  30 mg/2 mL of OrthoVisc (sodium hyaluronate) in a prefilled syringe was injected easily into the knee through a 18-gauge needle. Completed without difficulty  Pain immediately resolved suggesting accurate placement of the medication.  Advised to call if fevers/chills, erythema, induration, drainage, or persistent bleeding.  Images permanently stored and available for review in the ultrasound unit.  Impression: Technically successful ultrasound guided injection.

## 2016-11-16 NOTE — Assessment & Plan Note (Signed)
Aspiration and Orthovisc injection #1 of 4 into the right knee, return in one week for #2.

## 2016-11-23 ENCOUNTER — Encounter: Payer: Self-pay | Admitting: Sports Medicine

## 2016-11-23 ENCOUNTER — Ambulatory Visit (INDEPENDENT_AMBULATORY_CARE_PROVIDER_SITE_OTHER): Payer: BC Managed Care – PPO | Admitting: Sports Medicine

## 2016-11-23 DIAGNOSIS — L2082 Flexural eczema: Secondary | ICD-10-CM

## 2016-11-23 DIAGNOSIS — M170 Bilateral primary osteoarthritis of knee: Secondary | ICD-10-CM | POA: Diagnosis not present

## 2016-11-23 HISTORY — DX: Flexural eczema: L20.82

## 2016-11-23 MED ORDER — TRIAMCINOLONE ACETONIDE 0.1 % EX OINT: 1.0000 "application " | TOPICAL_OINTMENT | Freq: Two times a day (BID) | CUTANEOUS | 6 refills | Status: DC

## 2016-11-23 NOTE — Addendum Note (Signed)
Addended by: Baird KayUGLAS, Jesstin Studstill M on: 11/23/2016 09:21 AM   Modules accepted: Orders

## 2016-11-23 NOTE — Addendum Note (Signed)
Addended by: Monica BectonHEKKEKANDAM, Zhaniya Swallows J on: 11/23/2016 09:27 AM   Modules accepted: Orders

## 2016-11-23 NOTE — Progress Notes (Addendum)
   Procedure: Real-time Ultrasound Guided aspiration/injection of right knee Device: GE Logiq E  Verbal informed consent obtained.  Time-out conducted.  Noted no overlying erythema, induration, or other signs of local infection.  Skin prepped in a sterile fashion.  Local anesthesia: Topical Ethyl chloride.  With sterile technique and under real time ultrasound guidance:  Using 18-gauge needle aspirated 20 mL straw-colored fluid, syringe switched and  30 mg/2 mL of OrthoVisc (sodium hyaluronate) in a prefilled syringe was injected easily into the knee through a 18-gauge needle. Completed without difficulty  Pain immediately resolved suggesting accurate placement of the medication.  Advised to call if fevers/chills, erythema, induration, drainage, or persistent bleeding.  Images permanently stored and available for review in the ultrasound unit.  Impression: Technically successful ultrasound guided injection.

## 2016-11-23 NOTE — Assessment & Plan Note (Signed)
Topical triamcinolone ointment.

## 2016-11-23 NOTE — Assessment & Plan Note (Signed)
Orthovisc injection #2 of 4 into the right knee, return in one week for #3. 

## 2016-11-30 ENCOUNTER — Ambulatory Visit (INDEPENDENT_AMBULATORY_CARE_PROVIDER_SITE_OTHER): Payer: BC Managed Care – PPO | Admitting: Sports Medicine

## 2016-11-30 ENCOUNTER — Encounter: Payer: Self-pay | Admitting: Sports Medicine

## 2016-11-30 DIAGNOSIS — M170 Bilateral primary osteoarthritis of knee: Secondary | ICD-10-CM

## 2016-11-30 NOTE — Progress Notes (Signed)
   Procedure: Real-time Ultrasound Guided aspiration/injection of right knee Device: GE Logiq E  Verbal informed consent obtained.  Time-out conducted.  Noted no overlying erythema, induration, or other signs of local infection.  Skin prepped in a sterile fashion.  Local anesthesia: Topical Ethyl chloride.  With sterile technique and under real time ultrasound guidance:  Using 11-gauge needle aspirated 20 mL straw-colored fluid, syringe switched and  30 mg/2 mL of OrthoVisc (sodium hyaluronate) in a prefilled syringe was injected easily into the knee through a 18-gauge needle. Completed without difficulty  Pain immediately resolved suggesting accurate placement of the medication.  Advised to call if fevers/chills, erythema, induration, drainage, or persistent bleeding.  Images permanently stored and available for review in the ultrasound unit.  Impression: Technically successful ultrasound guided injection.

## 2016-11-30 NOTE — Assessment & Plan Note (Signed)
Orthovisc injection #3 of 4 into the right knee, return in one week for #4.  Not yet feeling relief.

## 2016-12-07 ENCOUNTER — Encounter: Payer: Self-pay | Admitting: Sports Medicine

## 2016-12-07 ENCOUNTER — Ambulatory Visit (INDEPENDENT_AMBULATORY_CARE_PROVIDER_SITE_OTHER): Payer: BC Managed Care – PPO | Admitting: Sports Medicine

## 2016-12-07 DIAGNOSIS — M170 Bilateral primary osteoarthritis of knee: Secondary | ICD-10-CM | POA: Diagnosis not present

## 2016-12-07 MED ORDER — ACETAMINOPHEN ER 650 MG PO TBCR: 650.0000 mg | EXTENDED_RELEASE_TABLET | Freq: Three times a day (TID) | ORAL | 3 refills | Status: AC | PRN

## 2016-12-07 NOTE — Progress Notes (Addendum)
   Procedure: Real-time Ultrasound Guided aspiration/injection of right knee Device: GE Logiq E  Verbal informed consent obtained.  Time-out conducted.  Noted no overlying erythema, induration, or other signs of local infection.  Skin prepped in a sterile fashion.  Local anesthesia: Topical Ethyl chloride.  With sterile technique and under real time ultrasound guidance:  Using 18-gauge needle aspirated 12 mL straw-colored fluid, syringe switched and  30 mg/2 mL of OrthoVisc (sodium hyaluronate) in a prefilled syringe was injected easily into the knee through a 18-gauge needle. Completed without difficulty  Pain immediately resolved suggesting accurate placement of the medication.  Advised to call if fevers/chills, erythema, induration, drainage, or persistent bleeding.  Images permanently stored and available for review in the ultrasound unit.  Impression: Technically successful ultrasound guided injection.

## 2016-12-07 NOTE — Assessment & Plan Note (Addendum)
Orthovisc injection #4 of 4 into the right knee, return in one month. Adding arthritis strength Tylenol 3 times a day.

## 2016-12-21 ENCOUNTER — Other Ambulatory Visit: Payer: Self-pay | Admitting: Family Medicine

## 2017-01-16 ENCOUNTER — Ambulatory Visit: Payer: BC Managed Care – PPO

## 2017-01-21 ENCOUNTER — Other Ambulatory Visit: Payer: Self-pay | Admitting: Osteopathic Medicine

## 2017-01-23 ENCOUNTER — Ambulatory Visit: Payer: BC Managed Care – PPO

## 2017-01-23 ENCOUNTER — Other Ambulatory Visit: Payer: Self-pay

## 2017-01-23 MED ORDER — TRIAMTERENE-HCTZ 37.5-25 MG PO TABS: 1.0000 | ORAL_TABLET | Freq: Every day | ORAL | 0 refills | Status: DC

## 2017-02-12 ENCOUNTER — Ambulatory Visit (INDEPENDENT_AMBULATORY_CARE_PROVIDER_SITE_OTHER): Payer: BC Managed Care – PPO | Admitting: Physician Assistant

## 2017-02-12 VITALS — BP 136/73 | HR 101 | Temp 98.0°F | Ht 64.5 in | Wt 153.1 lb

## 2017-02-12 DIAGNOSIS — Z23 Encounter for immunization: Secondary | ICD-10-CM

## 2017-02-12 MED ORDER — ZOSTER VAC RECOMB ADJUVANTED 50 MCG/0.5ML IM SUSR: 0.5000 mL | Freq: Once | INTRAMUSCULAR | 0 refills | Status: DC

## 2017-02-12 NOTE — Progress Notes (Signed)
   Subjective:    Patient ID: Lindsay HatchetChristina G Moore, female    DOB: 26-Feb-1956, 61 y.o.   MRN: [id number]  HPI Pt was here for her 2nd dose of the Shingrix vaccine. Pt denies fever, chest pain, or shortness of breath.    Review of Systems     Objective:   Physical Exam  Vitals:   02/12/17 1550  BP: 136/73  Pulse: (!) 101  Temp: 98 F (36.7 C)  SpO2: 98%         Assessment & Plan:  Pt tolerated injection in her left deltoid well and without complications.

## 2017-04-10 ENCOUNTER — Ambulatory Visit: Payer: BC Managed Care – PPO | Admitting: Osteopathic Medicine

## 2017-04-12 ENCOUNTER — Encounter: Payer: Self-pay | Admitting: Osteopathic Medicine

## 2017-04-12 ENCOUNTER — Ambulatory Visit (INDEPENDENT_AMBULATORY_CARE_PROVIDER_SITE_OTHER): Payer: BC Managed Care – PPO | Admitting: Osteopathic Medicine

## 2017-04-12 VITALS — BP 124/83 | HR 106 | Wt 155.0 lb

## 2017-04-12 DIAGNOSIS — E876 Hypokalemia: Secondary | ICD-10-CM

## 2017-04-12 DIAGNOSIS — I10 Essential (primary) hypertension: Secondary | ICD-10-CM | POA: Diagnosis not present

## 2017-04-12 DIAGNOSIS — R7989 Other specified abnormal findings of blood chemistry: Secondary | ICD-10-CM

## 2017-04-12 DIAGNOSIS — Z23 Encounter for immunization: Secondary | ICD-10-CM

## 2017-04-12 LAB — BASIC METABOLIC PANEL WITH GFR
BUN/Creatinine Ratio: 17 (calc) (ref 6–22)
BUN: 20 mg/dL (ref 7–25)
CO2: 30 mmol/L (ref 20–32)
CREATININE: 1.19 mg/dL — AB (ref 0.50–0.99)
Calcium: 9.8 mg/dL (ref 8.6–10.4)
Chloride: 99 mmol/L (ref 98–110)
GFR, Est African American: 57 mL/min/{1.73_m2} — ABNORMAL LOW (ref >=60)
GFR, Est Non African American: 49 mL/min/{1.73_m2} — ABNORMAL LOW (ref >=60)
GLUCOSE: 98 mg/dL (ref 65–139)
Potassium: 3.3 mmol/L — ABNORMAL LOW (ref 3.5–5.3)
Sodium: 138 mmol/L (ref 135–146)

## 2017-04-12 MED ORDER — TRIAMTERENE-HCTZ 37.5-25 MG PO TABS: 1.0000 | ORAL_TABLET | Freq: Every day | ORAL | 3 refills | Status: DC

## 2017-04-12 NOTE — Progress Notes (Signed)
HPI: Lindsay Moore is a 61 y.o. female  who presents to [facility] today, 04/12/17,  for chief complaint of:  Chief Complaint  Patient presents with  . Follow-up    BLOOD PRESSURE    HTN: BP WNL today without HA/VC, tolerating meds well. Needs refills  Elevated Creatinine: mild, following    Past medical history, surgical history, social history and family history reviewed.  Patient Active Problem List   Diagnosis Date Noted  . Flexural eczema 11/23/2016  . Primary osteoarthritis of both knees 08/31/2016  . Biceps tendinitis on right 06/22/2015  . Serous otitis media 01/06/2015  . Postmenopausal bleeding 08/17/2014  . History of abnormal cervical Pap smear 06/19/2014  . Lumbar strain 01/06/2014  . Breast mass 07/11/2013  . History of colonic polyps 06/19/2013  . TIA (transient ischemic attack) 06/10/2013  . Essential hypertension, benign 06/10/2013  . Septal defect 06/10/2013  . 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 Situational anxiety 06/10/2013  . Hyperlipidemia 06/10/2013    Current medication list and allergy/intolerance information reviewed.   Current Outpatient Prescriptions on File Prior to Visit  Medication Sig Dispense Refill  . acetaminophen (TYLENOL) 650 MG CR tablet Take 1 tablet (650 mg total) by mouth every 8 (eight) hours as needed for pain. 90 tablet 3  . aspirin EC 81 MG tablet Take 1 tablet (81 mg total) by mouth daily. 150 tablet 2  . Calcium Carb-Cholecalciferol (CALCIUM + D3 PO) Take by mouth.    . Multiple Vitamin (MULTIVITAMIN) tablet Take 1 tablet by mouth daily.    . ranitidine (ZANTAC) 150 MG tablet Take 150 mg by mouth 2 (two) times daily. Reported on 12/14/2015    . triamcinolone ointment (KENALOG) 0.1 % Apply 1 application topically 2 (two) times daily. To affected areas 60 g 6  . triamterene-hydrochlorothiazide (MAXZIDE-25) 37.5-25 MG tablet Take 1 tablet by mouth daily. 90 tablet 0   No current  facility-administered medications on file prior to visit.    Allergies  Allergen Reactions  . Amlodipine     edema  . Bee Venom   . Ibuprofen     REACTION: breathing difficulty  . Lisinopril     tongue swelling      Review of Systems:  Constitutional: No recent illness  HEENT: No  headache, no vision change  Cardiac: No  chest pain, No  pressure, No palpitations  Respiratory:  No  shortness of breath. No  Cough  Neurologic: No  weakness, No  Dizziness   Exam:  BP 124/83   Pulse (!) 106   Wt 155 lb (70.3 kg)   BMI 26.19 kg/m   Constitutional: VS see above. General Appearance: alert, well-developed, well-nourished, NAD  Eyes: Normal lids and conjunctive, non-icteric sclera  Neck: No masses, trachea midline.   Respiratory: Normal respiratory effort. no wheeze, no rhonchi, no rales  Cardiovascular: S1/S2 normal, no murmur, no rub/gallop auscultated. RRR.   Musculoskeletal: Gait normal. Symmetric and independent movement of all extremities  Neurological: Normal balance/coordination. No tremor.  Skin: warm, dry, intact.   Psychiatric: Normal judgment/insight. Normal mood and affect. Oriented x3.     ASSESSMENT/PLAN:   Essential hypertension, benign  Need for immunization against influenza - Plan: Flu Vaccine QUAD 36+ mos IM  Elevated serum creatinine - Plan: BASIC METABOLIC PANEL WITH GFR      Follow-up plan: Return in about 6 months (around 10/11/2017) for [facility], sooner if needed .  Visit summary with medication list and pertinent  instructions was printed for patient to review, alert [place] if any changes needed. All questions at time of visit were answered - patient instructed to contact office with any additional concerns. ER/RTC precautions were reviewed with the patient and understanding verbalized.   Note: Total time spent 15 minutes, greater than 50% of the visit was spent face-to-face counseling and coordinating care for the following: The  primary encounter diagnosis was Essential hypertension, benign. Diagnoses of Need for immunization against influenza and Elevated serum creatinine were also pertinent to this visit.Marland Kitchen

## 2017-04-13 MED ORDER — POTASSIUM CHLORIDE CRYS ER 10 MEQ PO TBCR: 10.0000 meq | EXTENDED_RELEASE_TABLET | Freq: Every day | ORAL | 1 refills | Status: DC

## 2017-04-13 NOTE — Addendum Note (Signed)
Addended by: Deirdre Pippins on: 04/13/2017 08:19 AM   Modules accepted: Orders

## 2017-04-26 LAB — BASIC METABOLIC PANEL WITH GFR
BUN/Creatinine Ratio: 24 (calc) — ABNORMAL HIGH (ref 6–22)
BUN: 30 mg/dL — AB (ref 7–25)
CO2: 30 mmol/L (ref 20–32)
CREATININE: 1.27 mg/dL — AB (ref 0.50–0.99)
Calcium: 10.2 mg/dL (ref 8.6–10.4)
Chloride: 100 mmol/L (ref 98–110)
GFR, EST NON AFRICAN AMERICAN: 45 mL/min/{1.73_m2} — AB (ref >=60)
GFR, Est African American: 52 mL/min/{1.73_m2} — ABNORMAL LOW (ref >=60)
Glucose, Bld: 84 mg/dL (ref 65–99)
Potassium: 4.5 mmol/L (ref 3.5–5.3)
Sodium: 139 mmol/L (ref 135–146)

## 2017-05-30 ENCOUNTER — Other Ambulatory Visit: Payer: Self-pay | Admitting: Osteopathic Medicine

## 2017-05-30 DIAGNOSIS — Z1231 Encounter for screening mammogram for malignant neoplasm of breast: Secondary | ICD-10-CM

## 2017-06-27 ENCOUNTER — Ambulatory Visit
Admission: RE | Admit: 2017-06-27 | Discharge: 2017-06-27 | Disposition: A | Discharge status: Home / Self Care | Payer: BC Managed Care – PPO | Source: Ambulatory Visit | Attending: Osteopathic Medicine | Admitting: Osteopathic Medicine

## 2017-06-27 DIAGNOSIS — Z1231 Encounter for screening mammogram for malignant neoplasm of breast: Secondary | ICD-10-CM

## 2017-10-10 ENCOUNTER — Other Ambulatory Visit: Payer: Self-pay | Admitting: Osteopathic Medicine

## 2017-10-10 DIAGNOSIS — E876 Hypokalemia: Secondary | ICD-10-CM

## 2017-10-11 ENCOUNTER — Ambulatory Visit (INDEPENDENT_AMBULATORY_CARE_PROVIDER_SITE_OTHER): Payer: BC Managed Care – PPO | Admitting: Osteopathic Medicine

## 2017-10-11 ENCOUNTER — Encounter: Payer: Self-pay | Admitting: Osteopathic Medicine

## 2017-10-11 VITALS — BP 137/73 | HR 85 | Temp 97.5°F | Wt 159.1 lb

## 2017-10-11 DIAGNOSIS — I10 Essential (primary) hypertension: Secondary | ICD-10-CM | POA: Diagnosis not present

## 2017-10-11 DIAGNOSIS — E876 Hypokalemia: Secondary | ICD-10-CM | POA: Diagnosis not present

## 2017-10-11 DIAGNOSIS — R7989 Other specified abnormal findings of blood chemistry: Secondary | ICD-10-CM | POA: Diagnosis not present

## 2017-10-11 DIAGNOSIS — R7309 Other abnormal glucose: Secondary | ICD-10-CM | POA: Diagnosis not present

## 2017-10-11 DIAGNOSIS — E782 Mixed hyperlipidemia: Secondary | ICD-10-CM

## 2017-10-11 DIAGNOSIS — Z0000 Encounter for general adult medical examination without abnormal findings: Secondary | ICD-10-CM

## 2017-10-11 LAB — COMPLETE METABOLIC PANEL WITH GFR
AG Ratio: 1.7 (calc) (ref 1.0–2.5)
ALBUMIN MSPROF: 4.5 g/dL (ref 3.6–5.1)
ALKALINE PHOSPHATASE (APISO): 108 U/L (ref 33–130)
ALT: 18 U/L (ref 6–29)
AST: 24 U/L (ref 10–35)
BUN / CREAT RATIO: 17 (calc) (ref 6–22)
BUN: 19 mg/dL (ref 7–25)
CALCIUM: 10.2 mg/dL (ref 8.6–10.4)
CO2: 30 mmol/L (ref 20–32)
CREATININE: 1.15 mg/dL — AB (ref 0.50–0.99)
Chloride: 103 mmol/L (ref 98–110)
GFR, EST AFRICAN AMERICAN: 59 mL/min/{1.73_m2} — AB (ref >=60)
GFR, EST NON AFRICAN AMERICAN: 51 mL/min/{1.73_m2} — AB (ref >=60)
GLOBULIN: 2.6 g/dL (ref 1.9–3.7)
Glucose, Bld: 101 mg/dL — ABNORMAL HIGH (ref 65–99)
Potassium: 4.5 mmol/L (ref 3.5–5.3)
SODIUM: 142 mmol/L (ref 135–146)
TOTAL PROTEIN: 7.1 g/dL (ref 6.1–8.1)
Total Bilirubin: 0.6 mg/dL (ref 0.2–1.2)

## 2017-10-11 LAB — LIPID PANEL
CHOL/HDL RATIO: 3.7 (calc) (ref <5.0)
Cholesterol: 252 mg/dL — ABNORMAL HIGH (ref <200)
HDL: 69 mg/dL (ref >50)
LDL Cholesterol (Calc): 152 mg/dL (calc) — ABNORMAL HIGH
NON-HDL CHOLESTEROL (CALC): 183 mg/dL — AB (ref <130)
Triglycerides: 178 mg/dL — ABNORMAL HIGH (ref <150)

## 2017-10-11 LAB — CBC
HEMATOCRIT: 41.6 % (ref 35.0–45.0)
HEMOGLOBIN: 14.3 g/dL (ref 11.7–15.5)
MCH: 29.6 pg (ref 27.0–33.0)
MCHC: 34.4 g/dL (ref 32.0–36.0)
MCV: 86.1 fL (ref 80.0–100.0)
MPV: 11 fL (ref 7.5–12.5)
Platelets: 257 10*3/uL (ref 140–400)
RBC: 4.83 10*6/uL (ref 3.80–5.10)
RDW: 12.4 % (ref 11.0–15.0)
WBC: 7.2 10*3/uL (ref 3.8–10.8)

## 2017-10-11 NOTE — Patient Instructions (Signed)
Preventive Care 40-64 Years, Female Preventive care refers to lifestyle choices and visits with your health care provider that can promote health and wellness. What does preventive care include?  A yearly physical exam. This is also called an annual well check.  Dental exams once or twice a year.  Routine eye exams. Ask your health care provider how often you should have your eyes checked.  Personal lifestyle choices, including: ? Daily care of your teeth and gums. ? Regular physical activity. ? Eating a healthy diet. ? Avoiding tobacco and drug use. ? Limiting alcohol use. ? Practicing safe sex. ? Taking low-dose aspirin daily starting at age 58. ? Taking vitamin and mineral supplements as recommended by your health care provider. What happens during an annual well check? The services and screenings done by your health care provider during your annual well check will depend on your age, overall health, lifestyle risk factors, and family history of disease. Counseling Your health care provider may ask you questions about your:  Alcohol use.  Tobacco use.  Drug use.  Emotional well-being.  Home and relationship well-being.  Sexual activity.  Eating habits.  Work and work Statistician.  Method of birth control.  Menstrual cycle.  Pregnancy history.  Screening You may have the following tests or measurements:  Height, weight, and BMI.  Blood pressure.  Lipid and cholesterol levels. These may be checked every 5 years, or more frequently if you are over 81 years old.  Skin check.  Lung cancer screening. You may have this screening every year starting at age 78 if you have a 30-pack-year history of smoking and currently smoke or have quit within the past 15 years.  Fecal occult blood test (FOBT) of the stool. You may have this test every year starting at age 65.  Flexible sigmoidoscopy or colonoscopy. You may have a sigmoidoscopy every 5 years or a colonoscopy  every 10 years starting at age 30.  Hepatitis C blood test.  Hepatitis B blood test.  Sexually transmitted disease (STD) testing.  Diabetes screening. This is done by checking your blood sugar (glucose) after you have not eaten for a while (fasting). You may have this done every 1-3 years.  Mammogram. This may be done every 1-2 years. Talk to your health care provider about when you should start having regular mammograms. This may depend on whether you have a family history of breast cancer.  BRCA-related cancer screening. This may be done if you have a family history of breast, ovarian, tubal, or peritoneal cancers.  Pelvic exam and Pap test. This may be done every 3 years starting at age 80. Starting at age 36, this may be done every 5 years if you have a Pap test in combination with an HPV test.  Bone density scan. This is done to screen for osteoporosis. You may have this scan if you are at high risk for osteoporosis.  Discuss your test results, treatment options, and if necessary, the need for more tests with your health care provider. Vaccines Your health care provider may recommend certain vaccines, such as:  Influenza vaccine. This is recommended every year.  Tetanus, diphtheria, and acellular pertussis (Tdap, Td) vaccine. You may need a Td booster every 10 years.  Varicella vaccine. You may need this if you have not been vaccinated.  Zoster vaccine. You may need this after age 5.  Measles, mumps, and rubella (MMR) vaccine. You may need at least one dose of MMR if you were born in  1958 or later. You may also need a second dose.  Pneumococcal 13-valent conjugate (PCV13) vaccine. You may need this if you have certain conditions and were not previously vaccinated.  Pneumococcal polysaccharide (PPSV23) vaccine. You may need one or two doses if you smoke cigarettes or if you have certain conditions.  Meningococcal vaccine. You may need this if you have certain  conditions.  Hepatitis A vaccine. You may need this if you have certain conditions or if you travel or work in places where you may be exposed to hepatitis A.  Hepatitis B vaccine. You may need this if you have certain conditions or if you travel or work in places where you may be exposed to hepatitis B.  Haemophilus influenzae type b (Hib) vaccine. You may need this if you have certain conditions.  Talk to your health care provider about which screenings and vaccines you need and how often you need them. This information is not intended to replace advice given to you by your health care provider. Make sure you discuss any questions you have with your health care provider. Document Released: 07/16/2015 Document Revised: 03/08/2016 Document Reviewed: 04/20/2015 Elsevier Interactive Patient Education  2018 [employer]  

## 2017-10-11 NOTE — Progress Notes (Signed)
HPI: Lindsay Moore is a 62 y.o. female  who presents to [facility] today, 10/11/17,  for chief complaint of:  Annual physical    See below for review of preventive care. No additional complaints today.  Patient is accompanied by husband who assists with history-taking.   Past medical, surgical, social and family history reviewed: Patient Active Problem List   Diagnosis Date Noted  . Flexural eczema 11/23/2016  . Primary osteoarthritis of both knees 08/31/2016  . Biceps tendinitis on right 06/22/2015  . Serous otitis media 01/06/2015  . Postmenopausal bleeding 08/17/2014  . History of abnormal cervical Pap smear 06/19/2014  . Lumbar strain 01/06/2014  . Breast mass 07/11/2013  . History of colonic polyps 06/19/2013  . TIA (transient ischemic attack) 06/10/2013  . Essential hypertension, benign 06/10/2013  . Septal defect 06/10/2013  . 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 Situational anxiety 06/10/2013  . Hyperlipidemia 06/10/2013   Past Surgical History:  Procedure Laterality Date  . bicep surgery Right   . broken hip  2000  . FOOT TENDON SURGERY  2000  . GALLBLADDER SURGERY    . orthoscopic knee surgery  10/2012  . TONSILLECTOMY  1959  . TUBAL LIGATION  1988   Social History   Tobacco Use  . Smoking status: Former Smoker    Packs/day: 1.00    Years: 25.00    Pack years: 25.00    Types: Cigarettes  . Smokeless tobacco: Never Used  Substance Use Topics  . Alcohol use: Yes    Alcohol/week: 0.6 - 1.2 oz    Types: 1 - 2 Standard drinks or equivalent per week   Family History  Problem Relation Age of Onset  . Alcoholism Father   . Lung cancer Father   . Diabetes Unknown        grandmother  . Hyperlipidemia Mother   . Hypertension Mother   . Stroke Unknown        grandmother     Current medication list and allergy/intolerance information reviewed:   Current Outpatient Medications  Medication Sig Dispense Refill  .  acetaminophen (TYLENOL) 650 MG CR tablet Take 1 tablet (650 mg total) by mouth every 8 (eight) hours as needed for pain. 90 tablet 3  . aspirin EC 81 MG tablet Take 1 tablet (81 mg total) by mouth daily. 150 tablet 2  . Calcium Carb-Cholecalciferol (CALCIUM + D3 PO) Take by mouth.    . Multiple Vitamin (MULTIVITAMIN) tablet Take 1 tablet by mouth daily.    . potassium chloride (K-DUR,KLOR-CON) 10 MEQ tablet Take 1 tablet (10 mEq total) by mouth daily. 90 tablet 1  . ranitidine (ZANTAC) 150 MG tablet Take 150 mg by mouth 2 (two) times daily. Reported on 12/14/2015    . triamcinolone ointment (KENALOG) 0.1 % Apply 1 application topically 2 (two) times daily. To affected areas 60 g 6  . triamterene-hydrochlorothiazide (MAXZIDE-25) 37.5-25 MG tablet Take 1 tablet by mouth daily. 90 tablet 3   No current facility-administered medications for this visit.    Allergies  Allergen Reactions  . Amlodipine     edema  . Bee Venom   . Ibuprofen     REACTION: breathing difficulty  . Lisinopril     tongue swelling      Review of Systems:  Constitutional:  No  fever, no chills, No recent illness  HEENT: No  headache, no vision change, no hearing change  Cardiac: No  chest pain, No  pressure, No  palpitations  Respiratory:  No  shortness of breath. No  Cough  Gastrointestinal: No  abdominal pain, No  nausea,  Musculoskeletal: No new myalgia/arthralgia  Skin: No  Rash, No other wounds/concerning lesions  Endocrine: No cold intolerance,  No heat intolerance. No polyuria/polydipsia/polyphagia   Neurologic: No  weakness, No  dizziness  Psychiatric: No  concerns with depression, No  concerns with anxiety, No sleep problems, No mood problems  Exam:  BP 137/73 (BP Location: Left Arm, Patient Position: Sitting, Cuff Size: Normal)   Pulse 85   Temp (!) 97.5 F (36.4 C) (Oral)   Wt 159 lb 1.9 oz (72.2 kg)   BMI 26.89 kg/m   Constitutional: VS see above. General Appearance: alert,  well-developed, well-nourished, NAD  Eyes: Normal lids and conjunctive, non-icteric sclera  Ears, Nose, Mouth, Throat: MMM, Normal external inspection ears/nares/mouth/lips/gums. TM normal bilaterally. Pharynx/tonsils no erythema, no exudate. Nasal mucosa normal.   Neck: No masses, trachea midline. No thyroid enlargement. No tenderness/mass appreciated. No lymphadenopathy  Respiratory: Normal respiratory effort. no wheeze, no rhonchi, no rales  Cardiovascular: S1/S2 normal, no murmur, no rub/gallop auscultated. RRR. No lower extremity edema.   Gastrointestinal: Nontender, no masses. No hepatomegaly, no splenomegaly. No hernia appreciated. Bowel sounds normal. Rectal exam deferred.   Musculoskeletal: Gait normal. No clubbing/cyanosis of digits.   Neurological: Normal balance/coordination. No tremor. No cranial nerve deficit on limited exam.    Skin: warm, dry, intact. No rash/ulcer. No concerning nevi or subq nodules on limited exam.    Psychiatric: Normal judgment/insight. Normal mood and affect. Oriented x3.     ASSESSMENT/PLAN:   Annual physical exam - Plan: CBC, COMPLETE METABOLIC PANEL WITH GFR, Lipid panel  Essential hypertension, benign - Plan: CBC, COMPLETE METABOLIC PANEL WITH GFR, Lipid panel  Elevated serum creatinine - Plan: COMPLETE METABOLIC PANEL WITH GFR  Low serum potassium - Plan: COMPLETE METABOLIC PANEL WITH GFR   Patient Instructions  Preventive Care 40-64 Years, Female Preventive care refers to lifestyle choices and visits with your health care provider that can promote health and wellness. What does preventive care include?  A yearly physical exam. This is also called an annual well check.  Dental exams once or twice a year.  Routine eye exams. Ask your health care provider how often you should have your eyes checked.  Personal lifestyle choices, including: ? Daily care of your teeth and gums. ? Regular physical activity. ? Eating a healthy  diet. ? Avoiding tobacco and drug use. ? Limiting alcohol use. ? Practicing safe sex. ? Taking low-dose aspirin daily starting at age 31. ? Taking vitamin and mineral supplements as recommended by your health care provider. What happens during an annual well check? The services and screenings done by your health care provider during your annual well check will depend on your age, overall health, lifestyle risk factors, and family history of disease. Counseling Your health care provider may ask you questions about your:  Alcohol use.  Tobacco use.  Drug use.  Emotional well-being.  Home and relationship well-being.  Sexual activity.  Eating habits.  Work and work Statistician.  Method of birth control.  Menstrual cycle.  Pregnancy history.  Screening You may have the following tests or measurements:  Height, weight, and BMI.  Blood pressure.  Lipid and cholesterol levels. These may be checked every 5 years, or more frequently if you are over 48 years old.  Skin check.  Lung cancer screening. You may have this screening every year starting at age  55 if you have a 30-pack-year history of smoking and currently smoke or have quit within the past 15 years.  Fecal occult blood test (FOBT) of the stool. You may have this test every year starting at age 58.  Flexible sigmoidoscopy or colonoscopy. You may have a sigmoidoscopy every 5 years or a colonoscopy every 10 years starting at age 58.  Hepatitis C blood test.  Hepatitis B blood test.  Sexually transmitted disease (STD) testing.  Diabetes screening. This is done by checking your blood sugar (glucose) after you have not eaten for a while (fasting). You may have this done every 1-3 years.  Mammogram. This may be done every 1-2 years. Talk to your health care provider about when you should start having regular mammograms. This may depend on whether you have a family history of breast cancer.  BRCA-related cancer  screening. This may be done if you have a family history of breast, ovarian, tubal, or peritoneal cancers.  Pelvic exam and Pap test. This may be done every 3 years starting at age 57. Starting at age 21, this may be done every 5 years if you have a Pap test in combination with an HPV test.  Bone density scan. This is done to screen for osteoporosis. You may have this scan if you are at high risk for osteoporosis.  Discuss your test results, treatment options, and if necessary, the need for more tests with your health care provider. Vaccines Your health care provider may recommend certain vaccines, such as:  Influenza vaccine. This is recommended every year.  Tetanus, diphtheria, and acellular pertussis (Tdap, Td) vaccine. You may need a Td booster every 10 years.  Varicella vaccine. You may need this if you have not been vaccinated.  Zoster vaccine. You may need this after age 85.  Measles, mumps, and rubella (MMR) vaccine. You may need at least one dose of MMR if you were born in 1957 or later. You may also need a second dose.  Pneumococcal 13-valent conjugate (PCV13) vaccine. You may need this if you have certain conditions and were not previously vaccinated.  Pneumococcal polysaccharide (PPSV23) vaccine. You may need one or two doses if you smoke cigarettes or if you have certain conditions.  Meningococcal vaccine. You may need this if you have certain conditions.  Hepatitis A vaccine. You may need this if you have certain conditions or if you travel or work in places where you may be exposed to hepatitis A.  Hepatitis B vaccine. You may need this if you have certain conditions or if you travel or work in places where you may be exposed to hepatitis B.  Haemophilus influenzae type b (Hib) vaccine. You may need this if you have certain conditions.  Talk to your health care provider about which screenings and vaccines you need and how often you need them. This information is not  intended to replace advice given to you by your health care provider. Make sure you discuss any questions you have with your health care provider. Document Released: 07/16/2015 Document Revised: 03/08/2016 Document Reviewed: 04/20/2015 Elsevier Interactive Patient Education  2018 [place] Updated 10/11/17   ANNUAL SCREENING/COUNSELING  Diet/Exercise - HEALTHY HABITS DISCUSSED TO DECREASE CV RISK Social History   Tobacco Use  Smoking Status Former Smoker  . Packs/day: 1.00  . Years: 25.00  . Pack years: 25.00  . Types: Cigarettes  . Last attempt to quit: 07/03/1997  . Years since quitting:  20.2  Smokeless Tobacco Never Used  Quit: >15 years ago  Social History   Substance and Sexual Activity  Alcohol Use Yes  . Alcohol/week: 0.6 - 1.2 oz  . Types: 1 - 2 Standard drinks or equivalent per week   Depression screen [facility] 2/9 10/11/2017  Decreased Interest 0  Down, Depressed, Hopeless 0  PHQ - 2 Score 0  Altered sleeping 0  Tired, decreased energy 0  Change in appetite 0  Feeling bad or failure about yourself  0  Trouble concentrating 0  Moving slowly or fidgety/restless 0  Suicidal thoughts 0  PHQ-9 Score 0  Difficult doing work/chores Not difficult at all    Domestic violence concerns - no  HTN SCREENING - SEE [place]  Sexually active in the past year - Yes with female.  Need/want STI testing today? - no  Concerns about libido or pain with sex? - no  Plans for pregnancy? - n/a postmenopausal   INFECTIOUS DISEASE SCREENING  HIV - needs - declined  GC/CT - does not need  HepC - DOB 1946-1965 - needs - declined   TB - does not need  DISEASE SCREENING  Lipid - does not need  DM2 - does not need  Osteoporosis - women age 26+ - does not need  Fracture after age 4? yes  CANCER SCREENING  Cervical - does not need - will be due 2020, confirmed NILM and neg HPV 06/2014  Breast - does not need - done  06/2017  Lung - does not need  Colon - does not need - done 2016  ADULT VACCINATION  Influenza - annual vaccine recommended  Td - booster every 10 years   Zoster - done  PCV13 - was not indicated  PPSV23 - was not indicated Immunization History  Administered Date(s) Administered  . DTaP 01/31/2009  . Influenza Split 05/17/2011, 05/29/2012  . Influenza Whole 04/02/2013  . Influenza,inj,Quad PF,6+ Mos 03/18/2014, 05/13/2015, 05/10/2016, 04/12/2017  . Tdap 10/10/2016  . Zoster Recombinat (Shingrix) 10/17/2016, 02/12/2017     Visit summary with medication list and pertinent instructions was printed for patient to review. All questions at time of visit were answered - patient instructed to contact office with any additional concerns. ER/RTC precautions were reviewed with the patient. Follow-up plan: Return in about 6 months (around 04/12/2018) for BP check .

## 2017-10-18 NOTE — Addendum Note (Signed)
Addended by: Deirdre PippinsALEXANDER, Kaytlin Burklow M on: 10/18/2017 04:15 PM   Modules accepted: Orders

## 2018-01-16 ENCOUNTER — Telehealth: Payer: Self-pay | Admitting: Osteopathic Medicine

## 2018-01-16 NOTE — Telephone Encounter (Signed)
Pt advised.

## 2018-01-16 NOTE — Telephone Encounter (Signed)
She can just go ahead and get labs done fasting at her convenience, if significantly abnormal I would want to see her in the office but if she wants to get labs done and I can review them that is okay.  FYI, can let her know I will be out of the office next week so a covering provider may respond but if she has any questions I will be happy to address it when I return to the office

## 2018-01-16 NOTE — Telephone Encounter (Signed)
Patient stated that her labs were abnormal last time and you wanted her to follow up in July. Your next available appointment is almost into August. Can patient simply get labs drawn to check her levels or does patient need an actual appointment? Please advise. Thanks!

## 2018-01-21 LAB — BASIC METABOLIC PANEL WITH GFR
BUN / CREAT RATIO: 17 (calc) (ref 6–22)
BUN: 21 mg/dL (ref 7–25)
CO2: 30 mmol/L (ref 20–32)
Calcium: 10.4 mg/dL (ref 8.6–10.4)
Chloride: 100 mmol/L (ref 98–110)
Creat: 1.25 mg/dL — ABNORMAL HIGH (ref 0.50–0.99)
GFR, EST AFRICAN AMERICAN: 53 mL/min/{1.73_m2} — AB (ref >=60)
GFR, Est Non African American: 46 mL/min/{1.73_m2} — ABNORMAL LOW (ref >=60)
GLUCOSE: 101 mg/dL — AB (ref 65–99)
Potassium: 4.4 mmol/L (ref 3.5–5.3)
SODIUM: 139 mmol/L (ref 135–146)

## 2018-01-21 LAB — LIPID PANEL
CHOL/HDL RATIO: 3.8 (calc) (ref <5.0)
Cholesterol: 257 mg/dL — ABNORMAL HIGH (ref <200)
HDL: 68 mg/dL (ref >50)
LDL CHOLESTEROL (CALC): 160 mg/dL — AB
NON-HDL CHOLESTEROL (CALC): 189 mg/dL — AB (ref <130)
TRIGLYCERIDES: 157 mg/dL — AB (ref <150)

## 2018-01-24 ENCOUNTER — Telehealth: Payer: Self-pay

## 2018-01-24 NOTE — Telephone Encounter (Signed)
Pt called requesting most recent fasting lab results. Pls advise, thanks.

## 2018-01-24 NOTE — Telephone Encounter (Signed)
Labs show the cholesterol is approximately the same. Kidney function has minimally worsened and kidney labs are essentially the same. Follow-up in person with Dr. Lyn HollingsheadAlexander in a few weeks.

## 2018-01-24 NOTE — Telephone Encounter (Signed)
Pt has been updated of lab results. She will make an appt in the next couple of weeks to review lab results in detail with Dr. Lyn HollingsheadAlexander.

## 2018-01-31 ENCOUNTER — Ambulatory Visit: Payer: BC Managed Care – PPO | Admitting: Osteopathic Medicine

## 2018-03-24 ENCOUNTER — Other Ambulatory Visit: Payer: Self-pay | Admitting: Osteopathic Medicine

## 2018-03-24 DIAGNOSIS — E876 Hypokalemia: Secondary | ICD-10-CM

## 2018-04-12 ENCOUNTER — Encounter: Payer: Self-pay | Admitting: Osteopathic Medicine

## 2018-04-12 ENCOUNTER — Ambulatory Visit (INDEPENDENT_AMBULATORY_CARE_PROVIDER_SITE_OTHER): Payer: BC Managed Care – PPO | Admitting: Osteopathic Medicine

## 2018-04-12 DIAGNOSIS — F4323 Adjustment disorder with mixed anxiety and depressed mood: Secondary | ICD-10-CM

## 2018-04-12 DIAGNOSIS — Z23 Encounter for immunization: Secondary | ICD-10-CM | POA: Diagnosis not present

## 2018-04-12 DIAGNOSIS — I10 Essential (primary) hypertension: Secondary | ICD-10-CM | POA: Diagnosis not present

## 2018-04-12 DIAGNOSIS — E876 Hypokalemia: Secondary | ICD-10-CM

## 2018-04-12 DIAGNOSIS — R7989 Other specified abnormal findings of blood chemistry: Secondary | ICD-10-CM | POA: Diagnosis not present

## 2018-04-12 DIAGNOSIS — E782 Mixed hyperlipidemia: Secondary | ICD-10-CM

## 2018-04-12 MED ORDER — CLONAZEPAM 0.5 MG PO TABS: 0.2500 mg | ORAL_TABLET | Freq: Two times a day (BID) | ORAL | 0 refills | Status: DC | PRN

## 2018-04-12 NOTE — Progress Notes (Signed)
HPI: Lindsay Moore is a 62 y.o. 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  she presents to [facility] today, 04/12/18,  for chief complaint of:  Follow-up blood pressure Questions about anxiety/grief  . Hypertension: No chest pain, pressure, shortness of breath, dizziness, headaches.  Blood pressure under good control. . Hyperlipidemia: Patient has upcoming appointment to recheck cholesterol . Recent death of her brother, she is having some difficulty coping and noticing a lot of anxiety, recent twin grandchildren also born premature and she is taking care of grandchildren while their mom/siblings are still in the hospital.  She is going through a lot of stress and having difficulty coping, she has some questions about the safety of CBD oil  Patient is accompanied by husband who assists with history-taking.   Past medical history, surgical history, and family history reviewed.  Current medication list and allergy/intolerance information reviewed.   (See remainder of HPI, ROS, Phys Exam below)    ASSESSMENT/PLAN: Diagnoses of Essential hypertension, benign, Need for influenza vaccination, Elevated serum creatinine, Low serum potassium, Mixed hyperlipidemia, and Situational mixed anxiety and depressive disorder were pertinent to this visit.  Advised we do not have a whole letter evidence to state whether CBD oil may be effective, probably it is not harmful.  I am okay with her going ahead and trying this.  Also prescribed limited use clonazepam for situational anxiety/grief reaction.  Orders Placed This Encounter  Procedures  . Flu Vaccine QUAD 6+ mos PF IM (Fluarix Quad PF)  . Lipid panel  . COMPLETE METABOLIC PANEL WITH GFR   Meds ordered this encounter   Medications  . clonazePAM (KLONOPIN) 0.5 MG tablet    Sig: Take 0.5-1 tablets (0.25-0.5 mg total) by mouth 2 (two) times daily as needed for anxiety.    Dispense:  15 tablet    Refill:  0      Follow-up plan: Return in about 6 weeks (around 05/24/2018) for recheck cholesterol and kidney function .                    ############################################ ############################################ ############################################ ############################################    Outpatient Encounter Medications as of 04/12/2018  Medication Sig Note  . acetaminophen (TYLENOL) 650 MG CR tablet Take 1 tablet (650 mg total) by mouth every 8 (eight) hours as needed for pain. 10/11/2017: PRN  . aspirin EC 81 MG tablet Take 1 tablet (81 mg total) by mouth daily.   . Calcium Carb-Cholecalciferol (CALCIUM + D3 PO) Take by mouth.   Marland Kitchen MEGARED OMEGA-3 KRILL OIL PO Take by mouth.   . Multiple Vitamin (MULTIVITAMIN) tablet Take 1 tablet by mouth daily.   . potassium chloride (K-DUR,KLOR-CON) 10 MEQ tablet Take 1 tablet (10 mEq total) by mouth daily. Pt must keep appt on 04/12/18 for further refills.   . ranitidine (ZANTAC) 150 MG tablet Take 150 mg by mouth 2 (two) times daily. Reported on 12/14/2015   . triamcinolone ointment (KENALOG) 0.1 % Apply 1 application topically 2 (two) times daily. To affected areas   . triamterene-hydrochlorothiazide (MAXZIDE-25) 37.5-25 MG tablet Take 1 tablet by mouth daily.   . clonazePAM (KLONOPIN) 0.5 MG tablet Take 0.5-1 tablets (0.25-0.5 mg total) by mouth 2 (two) times daily as needed for anxiety.    No facility-administered encounter medications on file as of 04/12/2018.  Allergies  Allergen Reactions  . Amlodipine     edema  . Bee Venom   . Ibuprofen     REACTION: breathing difficulty  . Lisinopril     tongue swelling      Review of Systems:  Constitutional: No recent illness  HEENT: No  headache, no vision  change  Cardiac: No  chest pain, No  pressure, No palpitations  Respiratory:  No  shortness of breath. No  Cough  Gastrointestinal: No  abdominal pain, no change on bowel habits  Musculoskeletal: No new myalgia/arthralgia  Skin: No  Rash  Psychiatric: +concerns with depression, +concerns with anxiety  Exam:  BP 133/78 (BP Location: Left Arm, Patient Position: Sitting, Cuff Size: Normal)   Pulse 89   Temp 97.9 F (36.6 C) (Oral)   Wt 155 lb 12.8 oz (70.7 kg)   BMI 26.33 kg/m   Constitutional: VS see above. General Appearance: alert, well-developed, well-nourished, NAD  Eyes: Normal lids and conjunctive, non-icteric sclera  Ears, Nose, Mouth, Throat: MMM, Normal external inspection ears/nares/mouth/lips/gums.  Neck: No masses, trachea midline.   Respiratory: Normal respiratory effort. no wheeze, no rhonchi, no rales  Cardiovascular: S1/S2 normal, no murmur, no rub/gallop auscultated. RRR.   Musculoskeletal: Gait normal. Symmetric and independent movement of all extremities  Neurological: Normal balance/coordination. No tremor.  Skin: warm, dry, intact.   Psychiatric: Normal judgment/insight. Normal mood and affect. Oriented x3.   Visit summary with medication list and pertinent instructions was printed for patient to review, advised to alert [place] if any changes needed. All questions at time of visit were answered - patient instructed to contact office with any additional concerns. ER/RTC precautions were reviewed with the patient and understanding verbalized.   Follow-up plan: Return in about 6 weeks (around 05/24/2018) for recheck cholesterol and kidney function .   Please note: voice recognition software was used to produce this document, and typos may escape review. Please contact Dr. Lyn Hollingshead for any needed clarifications.

## 2018-04-13 ENCOUNTER — Other Ambulatory Visit: Payer: Self-pay | Admitting: Osteopathic Medicine

## 2018-04-13 DIAGNOSIS — I10 Essential (primary) hypertension: Secondary | ICD-10-CM

## 2018-05-06 ENCOUNTER — Ambulatory Visit: Payer: BC Managed Care – PPO | Admitting: Osteopathic Medicine

## 2018-05-21 ENCOUNTER — Other Ambulatory Visit: Payer: Self-pay | Admitting: Osteopathic Medicine

## 2018-05-21 DIAGNOSIS — Z1231 Encounter for screening mammogram for malignant neoplasm of breast: Secondary | ICD-10-CM

## 2018-05-21 LAB — COMPLETE METABOLIC PANEL WITH GFR
AG RATIO: 1.5 (calc) (ref 1.0–2.5)
ALBUMIN MSPROF: 4.3 g/dL (ref 3.6–5.1)
ALKALINE PHOSPHATASE (APISO): 105 U/L (ref 33–130)
ALT: 14 U/L (ref 6–29)
AST: 21 U/L (ref 10–35)
BUN / CREAT RATIO: 19 (calc) (ref 6–22)
BUN: 23 mg/dL (ref 7–25)
CHLORIDE: 102 mmol/L (ref 98–110)
CO2: 28 mmol/L (ref 20–32)
Calcium: 9.7 mg/dL (ref 8.6–10.4)
Creat: 1.22 mg/dL — ABNORMAL HIGH (ref 0.50–0.99)
GFR, EST AFRICAN AMERICAN: 55 mL/min/{1.73_m2} — AB (ref >=60)
GFR, Est Non African American: 47 mL/min/{1.73_m2} — ABNORMAL LOW (ref >=60)
Globulin: 2.8 g/dL (calc) (ref 1.9–3.7)
Glucose, Bld: 91 mg/dL (ref 65–99)
POTASSIUM: 3.6 mmol/L (ref 3.5–5.3)
Sodium: 141 mmol/L (ref 135–146)
Total Bilirubin: 0.3 mg/dL (ref 0.2–1.2)
Total Protein: 7.1 g/dL (ref 6.1–8.1)

## 2018-05-21 LAB — LIPID PANEL
Cholesterol: 216 mg/dL — ABNORMAL HIGH (ref <200)
HDL: 71 mg/dL (ref >50)
LDL CHOLESTEROL (CALC): 122 mg/dL — AB
NON-HDL CHOLESTEROL (CALC): 145 mg/dL — AB (ref <130)
TRIGLYCERIDES: 119 mg/dL (ref <150)
Total CHOL/HDL Ratio: 3 (calc) (ref <5.0)

## 2018-05-24 ENCOUNTER — Ambulatory Visit (INDEPENDENT_AMBULATORY_CARE_PROVIDER_SITE_OTHER): Payer: BC Managed Care – PPO | Admitting: Osteopathic Medicine

## 2018-05-24 ENCOUNTER — Encounter: Payer: Self-pay | Admitting: Osteopathic Medicine

## 2018-05-24 VITALS — BP 134/69 | HR 102 | Temp 97.9°F | Wt 154.4 lb

## 2018-05-24 DIAGNOSIS — I10 Essential (primary) hypertension: Secondary | ICD-10-CM

## 2018-05-24 DIAGNOSIS — E782 Mixed hyperlipidemia: Secondary | ICD-10-CM | POA: Diagnosis not present

## 2018-05-24 NOTE — Progress Notes (Signed)
HPI: Lindsay Moore is a 62 y.o. 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  she presents to [facility] today, 05/24/18,  for chief complaint of:  Follow-up cholesterol  Cholesterol was slightly elevated, she has been working on diet/exercise, numbers have improved, ASCVD risk does not put her in statin benefit group at this time.   At today's visit... Past medical history, surgical history, and family history reviewed and updated as needed.  Current medication list and allergy/intolerance information reviewed and updated as needed. (See remainder of HPI, ROS, Phys Exam below)   Recent Results (from the past 2160 hour(s))  Lipid panel     Status: Abnormal   Collection Time: 05/21/18  8:14 AM  Result Value Ref Range   Cholesterol 216 (H) <200 mg/dL   HDL 71 >47 mg/dL   Triglycerides 829 <562 mg/dL   LDL Cholesterol (Calc) 122 (H) mg/dL (calc)    Comment: Reference range: <100 . Desirable range <100 mg/dL for primary prevention;   <70 mg/dL for patients with CHD or diabetic patients  with > or = 2 CHD risk factors. Marland Kitchen LDL-C is now calculated using the Martin-Hopkins  calculation, which is a validated novel method providing  better accuracy than the Friedewald equation in the  estimation of LDL-C.  Horald Pollen et al. Lenox Ahr. 1308;657(84): 2061-2068  (http://education.[facility].com/faq/FAQ164)    Total CHOL/HDL Ratio 3.0 <5.0 (calc)   Non-HDL Cholesterol (Calc) 145 (H) <130 mg/dL (calc)    Comment: For patients with diabetes plus 1 major ASCVD risk  factor, treating to a non-HDL-C goal of <100 mg/dL  (LDL-C of <69 mg/dL) is considered a therapeutic  option.   COMPLETE METABOLIC PANEL WITH GFR     Status: Abnormal   Collection Time: 05/21/18  8:14 AM   Result Value Ref Range   Glucose, Bld 91 65 - 99 mg/dL    Comment: .            Fasting reference interval .    BUN 23 7 - 25 mg/dL   Creat 6.29 (H) 5.28 - 0.99 mg/dL    Comment: For patients >4 years of age, the reference limit for Creatinine is approximately 13% higher for people identified as African-American. .    GFR, Est Non African American 47 (L) > OR = 60 mL/min/1.70m2   GFR, Est African American 55 (L) > OR = 60 mL/min/1.77m2   BUN/Creatinine Ratio 19 6 - 22 (calc)   Sodium 141 135 - 146 mmol/L   Potassium 3.6 3.5 - 5.3 mmol/L   Chloride 102 98 - 110 mmol/L   CO2 28 20 - 32 mmol/L   Calcium 9.7 8.6 - 10.4 mg/dL   Total Protein 7.1 6.1 - 8.1 g/dL   Albumin 4.3 3.6 - 5.1 g/dL   Globulin 2.8 1.9 - 3.7 g/dL (calc)   AG Ratio 1.5 1.0 - 2.5 (calc)   Total Bilirubin 0.3 0.2 - 1.2 mg/dL   Alkaline phosphatase (APISO) 105 33 - 130 U/L   AST 21 10 - 35 U/L   ALT 14 6 - 29 U/L          ASSESSMENT/PLAN: The primary encounter diagnosis was Essential hypertension, benign. A diagnosis of Mixed hyperlipidemia was also pertinent to this visit.   The 10-year ASCVD risk score Denman George DC Montez Hageman., et al., 2013) is: 6.1%  Values used to calculate the score:     Age: [age] years     Sex: Female     Is Non-Hispanic African American: No     Diabetic: No     Tobacco smoker: No     Systolic Blood Pressure: 134 mmHg     Is BP treated: Yes     HDL Cholesterol: 71 mg/dL     Total Cholesterol: 216 mg/dL      Follow-up plan: Return in about 6 months (around 11/22/2018) for ANNUAL PHYSICAL W/ FASTING LABS - sooner if needed .                             ############################################ ############################################ ############################################ ############################################    Current Meds  Medication Sig  . acetaminophen (TYLENOL) 650 MG CR tablet Take 1 tablet (650 mg total) by mouth every 8  (eight) hours as needed for pain.  Marland Kitchen. aspirin EC 81 MG tablet Take 1 tablet (81 mg total) by mouth daily.  . Calcium Carb-Cholecalciferol (CALCIUM + D3 PO) Take by mouth.  . clonazePAM (KLONOPIN) 0.5 MG tablet Take 0.5-1 tablets (0.25-0.5 mg total) by mouth 2 (two) times daily as needed for anxiety.  Marland Kitchen. MEGARED OMEGA-3 KRILL OIL PO Take by mouth.  . Multiple Vitamin (MULTIVITAMIN) tablet Take 1 tablet by mouth daily.  . potassium chloride (K-DUR,KLOR-CON) 10 MEQ tablet Take 1 tablet (10 mEq total) by mouth daily. Pt must keep appt on 04/12/18 for further refills.  . ranitidine (ZANTAC) 150 MG tablet Take 150 mg by mouth 2 (two) times daily. Reported on 12/14/2015  . triamcinolone ointment (KENALOG) 0.1 % Apply 1 application topically 2 (two) times daily. To affected areas  . triamterene-hydrochlorothiazide (MAXZIDE-25) 37.5-25 MG tablet TAKE 1 TABLET BY MOUTH ONCE DAILY    Allergies  Allergen Reactions  . Amlodipine     edema  . Bee Venom   . Ibuprofen     REACTION: breathing difficulty  . Lisinopril     tongue swelling       Review of Systems:  Constitutional: No recent illness  HEENT: No  headache, no vision change  Cardiac: No  chest pain, No  pressure, No palpitations  Respiratory:  No  shortness of breath. No  Cough  Neurologic: No  weakness, No  Dizziness  Psychiatric: No  concerns with depression, No  concerns with anxiety  Exam:  BP 134/69 (BP Location: Left Arm, Patient Position: Sitting, Cuff Size: Normal)   Pulse (!) 102   Temp 97.9 F (36.6 C) (Oral)   Wt 154 lb 6.4 oz (70 kg)   BMI 26.09 kg/m   Constitutional: VS see above. General Appearance: alert, well-developed, well-nourished, NAD  Eyes: Normal lids and conjunctive, non-icteric sclera  Ears, Nose, Mouth, Throat: MMM, Normal external inspection ears/nares/mouth/lips/gums.  Neck: No masses, trachea midline.   Respiratory: Normal respiratory effort. no wheeze, no rhonchi, no rales  Cardiovascular:  S1/S2 normal, no murmur, no rub/gallop auscultated. RRR.   Musculoskeletal: Gait normal. Symmetric and independent movement of all extremities  Neurological: Normal balance/coordination. No tremor.  Skin: warm, dry, intact.   Psychiatric: Normal judgment/insight. Normal mood and affect. Oriented x3.       Visit summary with medication list and pertinent instructions was printed for patient to review, patient was advised to alert us if any updates are needed. All questions at time of visit were answered - patient instructed to contact office with any additional concerns.  ER/RTC precautions were reviewed with the patient and understanding verbalized.   Note: Total time spent 15 minutes, greater than 50% of the visit was spent face-to-face counseling and coordinating care for the following: The primary encounter diagnosis was Essential hypertension, benign. A diagnosis of Mixed hyperlipidemia was also pertinent to this visit.Marland Kitchen  Please note: voice recognition software was used to produce this document, and typos may escape review. Please contact Dr. Lyn Hollingshead for any needed clarifications.    Follow up plan: No follow-ups on file.

## 2018-06-03 ENCOUNTER — Ambulatory Visit: Payer: BC Managed Care – PPO | Admitting: Physician Assistant

## 2018-06-06 ENCOUNTER — Encounter: Payer: Self-pay | Admitting: Osteopathic Medicine

## 2018-06-06 ENCOUNTER — Ambulatory Visit (INDEPENDENT_AMBULATORY_CARE_PROVIDER_SITE_OTHER): Payer: BC Managed Care – PPO | Admitting: Osteopathic Medicine

## 2018-06-06 DIAGNOSIS — J069 Acute upper respiratory infection, unspecified: Secondary | ICD-10-CM | POA: Diagnosis not present

## 2018-06-06 DIAGNOSIS — H6983 Other specified disorders of Eustachian tube, bilateral: Secondary | ICD-10-CM

## 2018-06-06 MED ORDER — NEOMYCIN-POLYMYXIN-HC 3.5-10000-1 OT SUSP: 4.0000 [drp] | Freq: Three times a day (TID) | OTIC | 0 refills | Status: DC

## 2018-06-06 MED ORDER — AMOXICILLIN-POT CLAVULANATE 875-125 MG PO TABS: 1.0000 | ORAL_TABLET | Freq: Two times a day (BID) | ORAL | 0 refills | Status: AC

## 2018-06-06 MED ORDER — IPRATROPIUM BROMIDE 0.06 % NA SOLN: 2.0000 | Freq: Four times a day (QID) | NASAL | 1 refills | Status: DC

## 2018-06-06 NOTE — Progress Notes (Signed)
HPI: Lindsay Moore is a 62 y.o. 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  she presents to [facility] today, 06/06/18,  for chief complaint of: Sick - URI/Ear pain   Sick for a few days w/ sinus congestion, ear pain. Feels really stopped up / clogged. Some sinus pressure, some sore throat, she does have cough, no fever. Concerned about ear infection. Used some old ear drops she had from 07/2016, these seemed to help.       Past medical, surgical, social and family history reviewed and updated as necessary.   Current medication list and allergy/intolerance information reviewed:    Current Outpatient Medications  Medication Sig Dispense Refill  . acetaminophen (TYLENOL) 650 MG CR tablet Take 1 tablet (650 mg total) by mouth every 8 (eight) hours as needed for pain. 90 tablet 3  . aspirin EC 81 MG tablet Take 1 tablet (81 mg total) by mouth daily. 150 tablet 2  . Calcium Carb-Cholecalciferol (CALCIUM + D3 PO) Take by mouth.    . clonazePAM (KLONOPIN) 0.5 MG tablet Take 0.5-1 tablets (0.25-0.5 mg total) by mouth 2 (two) times daily as needed for anxiety. 15 tablet 0  . MEGARED OMEGA-3 KRILL OIL PO Take by mouth.    . Multiple Vitamin (MULTIVITAMIN) tablet Take 1 tablet by mouth daily.    . potassium chloride (K-DUR,KLOR-CON) 10 MEQ tablet Take 1 tablet (10 mEq total) by mouth daily. Pt must keep appt on 04/12/18 for further refills. 90 tablet 0  . ranitidine (ZANTAC) 150 MG tablet Take 150 mg by mouth 2 (two) times daily. Reported on 12/14/2015    . triamcinolone ointment (KENALOG) 0.1 % Apply 1 application topically 2 (two) times daily. To affected areas 60 g 6  . triamterene-hydrochlorothiazide (MAXZIDE-25) 37.5-25 MG tablet TAKE 1 TABLET BY MOUTH ONCE DAILY 90  tablet 3   No current facility-administered medications for this visit.     Allergies  Allergen Reactions  . Amlodipine     edema  . Bee Venom   . Ibuprofen     REACTION: breathing difficulty  . Lisinopril     tongue swelling      Review of Systems:  Constitutional:  No  fever, no chills, +recent illness, No unintentional weight changes. No significant fatigue.   HEENT: No  headache, no vision change, no hearing change, +sore throat, +sinus pressure  Cardiac: No  chest pain, No  pressure, No palpitations  Respiratory:  No  shortness of breath. +Cough  Gastrointestinal: No  abdominal pain, No  nausea, No  vomiting,  No  blood in stool, No  diarrhea  Musculoskeletal: No new myalgia/arthralgia  Skin: No  Rash  Neurologic: No  weakness, No  dizziness  Exam:  BP (!) 157/84   Pulse 95   Temp 98.3 F (36.8 C) (Oral)   Wt 156 lb 11.2 oz (71.1 kg)   BMI 26.48 kg/m   Constitutional: VS see above. General Appearance: alert, well-developed, well-nourished, NAD  Eyes: Normal lids and conjunctive, non-icteric sclera  Ears, Nose, Mouth, Throat: MMM, Normal external inspection ears/nares/mouth/lips/gums. TM normal bilaterally no dullness or bulging but R TM slight erythema and L canal (+)eczema. Pharynx/tonsils no erythema, no exudate. Nasal mucosa normal.   Neck: No masses, trachea midline. No tenderness/mass appreciated. No lymphadenopathy  Respiratory: Normal respiratory effort. no wheeze, no rhonchi,  no rales  Cardiovascular: S1/S2 normal, no murmur, no rub/gallop auscultated. RRR. No lower extremity edema.   Gastrointestinal: Nontender, no masses. Bowel sounds normal.  Musculoskeletal: Gait normal.   Neurological: Normal balance/coordination. No tremor.   Skin: warm, dry, intact.   Psychiatric: Normal judgment/insight. Normal mood and affect.      ASSESSMENT/PLAN: Diagnoses of Dysfunction of both eustachian tubes and Viral URI were pertinent to this  visit.   Meds ordered this encounter  Medications  . ipratropium (ATROVENT) 0.06 % nasal spray    Sig: Place 2 sprays into both nostrils 4 (four) times daily.    Dispense:  15 mL    Refill:  1  . neomycin-polymyxin-hydrocortisone (CORTISPORIN) 3.5-10000-1 OTIC suspension    Sig: Place 4 drops into the left ear 3 (three) times daily. For 7 days    Dispense:  10 mL    Refill:  0  . amoxicillin-clavulanate (AUGMENTIN) 875-125 MG tablet    Sig: Take 1 tablet by mouth 2 (two) times daily for 7 days. Fill if ear pain becomes severe, or sinus pain persists past 7-10 days    Dispense:  14 tablet    Refill:  0    Declined steroid burst RTC as needed      Visit summary with medication list and pertinent instructions was printed for patient to review. All questions at time of visit were answered - patient instructed to contact office with any additional concerns or updates. ER/RTC precautions were reviewed with the patient.   Follow-up plan: Return if symptoms worsen or fail to improve.    Please note: voice recognition software was used to produce this document, and typos may escape review. Please contact Dr. Lyn HollingsheadAlexander for any needed clarifications.

## 2018-06-23 ENCOUNTER — Other Ambulatory Visit: Payer: Self-pay | Admitting: Osteopathic Medicine

## 2018-06-23 DIAGNOSIS — E876 Hypokalemia: Secondary | ICD-10-CM

## 2018-07-05 ENCOUNTER — Ambulatory Visit
Admission: RE | Admit: 2018-07-05 | Discharge: 2018-07-05 | Disposition: A | Discharge status: Home / Self Care | Payer: BC Managed Care – PPO | Source: Ambulatory Visit | Attending: Osteopathic Medicine | Admitting: Osteopathic Medicine

## 2018-07-05 DIAGNOSIS — Z1231 Encounter for screening mammogram for malignant neoplasm of breast: Secondary | ICD-10-CM

## 2018-09-16 ENCOUNTER — Other Ambulatory Visit: Payer: Self-pay | Admitting: Osteopathic Medicine

## 2018-09-16 DIAGNOSIS — E876 Hypokalemia: Secondary | ICD-10-CM

## 2018-10-14 ENCOUNTER — Telehealth: Payer: Self-pay | Admitting: Osteopathic Medicine

## 2018-10-14 DIAGNOSIS — E782 Mixed hyperlipidemia: Secondary | ICD-10-CM

## 2018-10-14 DIAGNOSIS — Z0000 Encounter for general adult medical examination without abnormal findings: Secondary | ICD-10-CM

## 2018-10-14 DIAGNOSIS — I10 Essential (primary) hypertension: Secondary | ICD-10-CM

## 2018-10-15 NOTE — Telephone Encounter (Signed)
Labs for annual.

## 2018-10-22 LAB — CBC
HCT: 44.4 % (ref 35.0–45.0)
Hemoglobin: 14.9 g/dL (ref 11.7–15.5)
MCH: 29.2 pg (ref 27.0–33.0)
MCHC: 33.6 g/dL (ref 32.0–36.0)
MCV: 87.1 fL (ref 80.0–100.0)
MPV: 10.7 fL (ref 7.5–12.5)
Platelets: 308 10*3/uL (ref 140–400)
RBC: 5.1 10*6/uL (ref 3.80–5.10)
RDW: 11.8 % (ref 11.0–15.0)
WBC: 7.4 10*3/uL (ref 3.8–10.8)

## 2018-10-22 LAB — TSH: TSH: 2.67 mIU/L (ref 0.40–4.50)

## 2018-10-22 LAB — COMPLETE METABOLIC PANEL WITH GFR
AG Ratio: 1.6 (calc) (ref 1.0–2.5)
ALT: 16 U/L (ref 6–29)
AST: 20 U/L (ref 10–35)
Albumin: 4.4 g/dL (ref 3.6–5.1)
Alkaline phosphatase (APISO): 97 U/L (ref 37–153)
BUN/Creatinine Ratio: 14 (calc) (ref 6–22)
BUN: 17 mg/dL (ref 7–25)
CO2: 31 mmol/L (ref 20–32)
Calcium: 10.4 mg/dL (ref 8.6–10.4)
Chloride: 100 mmol/L (ref 98–110)
Creat: 1.2 mg/dL — ABNORMAL HIGH (ref 0.50–0.99)
GFR, Est African American: 55 mL/min/{1.73_m2} — ABNORMAL LOW (ref >=60)
GFR, Est Non African American: 48 mL/min/{1.73_m2} — ABNORMAL LOW (ref >=60)
Globulin: 2.8 g/dL (calc) (ref 1.9–3.7)
Glucose, Bld: 97 mg/dL (ref 65–99)
Potassium: 4.2 mmol/L (ref 3.5–5.3)
Sodium: 142 mmol/L (ref 135–146)
Total Bilirubin: 0.5 mg/dL (ref 0.2–1.2)
Total Protein: 7.2 g/dL (ref 6.1–8.1)

## 2018-10-22 LAB — LIPID PANEL
Cholesterol: 260 mg/dL — ABNORMAL HIGH (ref <200)
HDL: 67 mg/dL (ref >=50)
LDL Cholesterol (Calc): 152 mg/dL (calc) — ABNORMAL HIGH
Non-HDL Cholesterol (Calc): 193 mg/dL (calc) — ABNORMAL HIGH (ref <130)
Total CHOL/HDL Ratio: 3.9 (calc) (ref <5.0)
Triglycerides: 241 mg/dL — ABNORMAL HIGH (ref <150)

## 2018-10-22 LAB — VITAMIN D 25 HYDROXY (VIT D DEFICIENCY, FRACTURES): Vit D, 25-Hydroxy: 34 ng/mL (ref 30–100)

## 2018-10-23 ENCOUNTER — Encounter: Payer: BC Managed Care – PPO | Admitting: Osteopathic Medicine

## 2018-11-21 ENCOUNTER — Encounter: Payer: Self-pay | Admitting: Osteopathic Medicine

## 2018-11-21 ENCOUNTER — Ambulatory Visit (INDEPENDENT_AMBULATORY_CARE_PROVIDER_SITE_OTHER): Payer: BC Managed Care – PPO | Admitting: Osteopathic Medicine

## 2018-11-21 VITALS — BP 135/83 | HR 99 | Temp 98.1°F | Wt 157.8 lb

## 2018-11-21 DIAGNOSIS — Z0000 Encounter for general adult medical examination without abnormal findings: Secondary | ICD-10-CM | POA: Diagnosis not present

## 2018-11-21 DIAGNOSIS — I10 Essential (primary) hypertension: Secondary | ICD-10-CM | POA: Diagnosis not present

## 2018-11-21 DIAGNOSIS — E782 Mixed hyperlipidemia: Secondary | ICD-10-CM

## 2018-11-21 NOTE — Patient Instructions (Addendum)
General Preventive Care  Most recent routine screening lipids/other labs: already done!   Blood pressure goal: 130/80  Tobacco: don't!   Alcohol: responsible moderation is ok for most adults - if you have concerns about your alcohol intake, please talk to me!   Exercise: as tolerated to reduce risk of cardiovascular disease and diabetes. Strength training will also prevent osteoporosis.   Mental health: if need for mental health care (medicines, counseling, other), or concerns about moods, please let me know!   Sexual health: if need for STD testing, or if concerns with libido/pain problems, please let me know!  Advanced Directive: Living Will and/or Healthcare Power of Attorney recommended for all adults, regardless of age or health.  Vaccines  Flu vaccine: recommended for almost everyone, every fall.   Shingles vaccine: Shingrix all done!   Pneumonia vaccines: Prevnar and Pneumovax recommended after age 9  Tetanus booster: Tdap due 10/2026 Cancer screenings   Colon cancer screening: due 10/2024  Breast cancer screening: mammogram recommended annually after age 46.   Cervical cancer screening: Pap every 1 to 5 years depending on age and other risk factors. Can usually stop at age 76 or w/ hysterectomy.   Lung cancer screening: not needed for people who have quit smoking >15 years ago  Infection screenings . HIV, Gonorrhea/Chlamydia: screening as needed . Hepatitis C: recommended for anyone born 45-1966 . TB: certain at-risk populations, or depending on work requirements and/or travel history Other . Bone Density Test: recommended for women at age 64

## 2018-11-21 NOTE — Progress Notes (Signed)
HPI: Lindsay Moore is a 63 y.o. 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  she presents to [facility] today, 11/21/18,  for chief complaint of: Annual physical, go over labs     Patient here for annual physical / wellness exam.  See preventive care reviewed as below.  Recent labs reviewed in detail with the patient.   Additional concerns today include:  None!       Past medical, surgical, social and family history reviewed:  Patient Active Problem List   Diagnosis Date Noted  . Flexural eczema 11/23/2016  . Primary osteoarthritis of both knees 08/31/2016  . Biceps tendinitis on right 06/22/2015  . Serous otitis media 01/06/2015  . Postmenopausal bleeding 08/17/2014  . History of abnormal cervical Pap smear 06/19/2014  . Lumbar strain 01/06/2014  . Breast mass 07/11/2013  . History of colonic polyps 06/19/2013  . TIA (transient ischemic attack) 06/10/2013  . Essential hypertension, benign 06/10/2013  . Septal defect 06/10/2013  . 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 Situational anxiety 06/10/2013  . Hyperlipidemia 06/10/2013    Past Surgical History:  Procedure Laterality Date  . bicep surgery Right   . broken hip  2000  . FOOT TENDON SURGERY  2000  . GALLBLADDER SURGERY    . orthoscopic knee surgery  10/2012  . TONSILLECTOMY  1959  . TUBAL LIGATION  1988    Social History   Tobacco Use  . Smoking status: Former Smoker    Packs/day: 1.00    Years: 25.00    Pack years: 25.00    Types: Cigarettes    Last attempt to quit: 07/03/1997    Years since quitting: 21.4  . Smokeless tobacco: Never Used  Substance Use Topics  . Alcohol use: Yes    Alcohol/week: 1.0 - 2.0 standard drinks    Types: 1 - 2 Standard drinks or equivalent per week     Family History  Problem Relation Age of Onset  . Alcoholism Father   . Lung cancer Father   . Diabetes Other        grandmother  . Hyperlipidemia Mother   . Hypertension Mother   . Stroke Other        grandmother     Current medication list and allergy/intolerance information reviewed:    Current Outpatient Medications  Medication Sig Dispense Refill  . acetaminophen (TYLENOL) 650 MG CR tablet Take 1 tablet (650 mg total) by mouth every 8 (eight) hours as needed for pain. 90 tablet 3  . aspirin EC 81 MG tablet Take 1 tablet (81 mg total) by mouth daily. 150 tablet 2  . Calcium Carb-Cholecalciferol (CALCIUM + D3 PO) Take by mouth.    . clonazePAM (KLONOPIN) 0.5 MG tablet Take 0.5-1 tablets (0.25-0.5 mg total) by mouth 2 (two) times daily as needed for anxiety. 15 tablet 0  . ipratropium (ATROVENT) 0.06 % nasal spray Place 2 sprays into both nostrils 4 (four) times daily. 15 mL 1  . MEGARED OMEGA-3 KRILL OIL PO Take by mouth.    . Multiple Vitamin (MULTIVITAMIN) tablet Take 1 tablet by mouth daily.    Marland Kitchen. neomycin-polymyxin-hydrocortisone (CORTISPORIN) 3.5-10000-1 OTIC suspension Place 4 drops into the left ear 3 (three) times daily. For 7 days 10 mL 0  . potassium chloride (K-DUR,KLOR-CON) 10 MEQ tablet TAKE 1 TABLET BY MOUTH ONCE DAILY .  APPOINTMENT REQUIRED FOR FUTURE REFILLS 90 tablet 1  . ranitidine (ZANTAC) 150 MG tablet Take 150 mg by mouth 2 (two) times daily. Reported on 12/14/2015    . triamcinolone ointment (KENALOG) 0.1 % Apply 1 application topically 2 (two) times daily. To affected areas 60 g 6  . triamterene-hydrochlorothiazide (MAXZIDE-25) 37.5-25 MG tablet TAKE 1 TABLET BY MOUTH ONCE DAILY 90 tablet 3   No current facility-administered medications for this visit.     Allergies  Allergen Reactions  . Amlodipine     edema  . Bee Venom   . Ibuprofen     REACTION: breathing difficulty  . Lisinopril     tongue swelling      Review of  Systems:  Constitutional:  No  fever, no chills, No recent illness, No unintentional weight changes. No significant fatigue.   HEENT: No  headache, no vision change, no hearing change, No sore throat, No  sinus pressure  Cardiac: No  chest pain, No  pressure, No palpitations, No  Orthopnea  Respiratory:  No  shortness of breath. No  Cough  Gastrointestinal: No  abdominal pain, No  nausea, No  vomiting,  No  blood in stool, No  diarrhea, No  constipation   Musculoskeletal: No new myalgia/arthralgia  Skin: No  Rash, No other wounds/concerning lesions  Genitourinary: No  incontinence, No  abnormal genital bleeding, No abnormal genital discharge  Hem/Onc: No  easy bruising/bleeding, No  abnormal lymph node  Endocrine: No cold intolerance,  No heat intolerance. No polyuria/polydipsia/polyphagia   Neurologic: No  weakness, No  dizziness, No  slurred speech/focal weakness/facial droop  Psychiatric: No  concerns with depression, No  concerns with anxiety, No sleep problems, No mood problems  Exam:  BP 135/83 (BP Location: Left Arm, Patient Position: Sitting, Cuff Size: Normal)   Pulse 99   Temp 98.1 F (36.7 C) (Oral)   Wt 157 lb 12.8 oz (71.6 kg)   BMI 26.67 kg/m   Constitutional: VS see above. General Appearance: alert, well-developed, well-nourished, NAD  Eyes: Normal lids and conjunctive, non-icteric sclera  Ears, Nose, Mouth, Throat: MMM, Normal external inspection ears/nares/mouth/lips/gums. TM normal bilaterally. Pharynx/tonsils no erythema, no exudate. Nasal mucosa normal.   Neck: No masses, trachea midline. No thyroid enlargement. No tenderness/mass appreciated. No lymphadenopathy  Respiratory: Normal respiratory effort. no wheeze, no rhonchi, no rales  Cardiovascular: S1/S2 normal, no murmur, no rub/gallop auscultated. RRR. No lower extremity edema.   Gastrointestinal: Nontender, no masses. No hepatomegaly, no splenomegaly. No hernia appreciated. Bowel sounds normal.  Rectal exam deferred.   Musculoskeletal: Gait normal. No clubbing/cyanosis of digits.   Neurological: Normal balance/coordination. No tremor. No cranial nerve deficit on limited exam. Motor and sensation intact and symmetric. Cerebellar reflexes intact.   Skin: warm, dry, intact. No rash/ulcer. No concerning nevi or subq nodules on limited exam.    Psychiatric: Normal judgment/insight. Normal mood and affect. Oriented x3.      ASSESSMENT/PLAN: The primary encounter diagnosis was Annual physical exam. Diagnoses of Essential hypertension, benign and Mixed hyperlipidemia were also pertinent to this visit.   Discussed statin based on ASCVD risk, pt would like to hold off and recheck labs in 6 mos    The 10-year ASCVD risk score Denman George DC Jr., et al., 2013) is: 7.9%   Values used to calculate the score:     Age: 63 years     Sex: Female     Is Non-Hispanic African American: No     Diabetic: No  Tobacco smoker: No     Systolic Blood Pressure: 135 mmHg     Is BP treated: Yes     HDL Cholesterol: 67 mg/dL     Total Cholesterol: 260 mg/dL  No orders of the defined types were placed in this encounter.   No orders of the defined types were placed in this encounter.   Patient Instructions  General Preventive Care  Most recent routine screening lipids/other labs: already done!   Blood pressure goal: 130/80  Tobacco: don't!   Alcohol: responsible moderation is ok for most adults - if you have concerns about your alcohol intake, please talk to me!   Exercise: as tolerated to reduce risk of cardiovascular disease and diabetes. Strength training will also prevent osteoporosis.   Mental health: if need for mental health care (medicines, counseling, other), or concerns about moods, please let me know!   Sexual health: if need for STD testing, or if concerns with libido/pain problems, please let me know!  Advanced Directive: Living Will and/or Healthcare Power of Attorney  recommended for all adults, regardless of age or health.  Vaccines  Flu vaccine: recommended for almost everyone, every fall.   Shingles vaccine: Shingrix all done!   Pneumonia vaccines: Prevnar and Pneumovax recommended after age 53  Tetanus booster: Tdap due 10/2026 Cancer screenings   Colon cancer screening: due 10/2024  Breast cancer screening: mammogram recommended annually after age 60.   Cervical cancer screening: Pap every 1 to 5 years depending on age and other risk factors. Can usually stop at age 63 or w/ hysterectomy.   Lung cancer screening: not needed for people who have quit smoking >15 years ago  Infection screenings . HIV, Gonorrhea/Chlamydia: screening as needed . Hepatitis C: recommended for anyone born 23-1966 . TB: certain at-risk populations, or depending on work requirements and/or travel history Other . Bone Density Test: recommended for women at age 61        Visit summary with medication list and pertinent instructions was printed for patient to review. All questions at time of visit were answered - patient instructed to contact office with any additional concerns or updates. ER/RTC precautions were reviewed with the patient.     Please note: voice recognition software was used to produce this document, and typos may escape review. Please contact Dr. Lyn Hollingshead for any needed clarifications.     Follow-up plan: Return in about 6 months (around 05/24/2019) for monitor labs and BP, see me sooner if needed! Marland Kitchen

## 2019-03-17 ENCOUNTER — Other Ambulatory Visit: Payer: Self-pay | Admitting: Osteopathic Medicine

## 2019-03-17 DIAGNOSIS — E876 Hypokalemia: Secondary | ICD-10-CM

## 2019-04-07 ENCOUNTER — Other Ambulatory Visit: Payer: Self-pay | Admitting: Osteopathic Medicine

## 2019-04-07 DIAGNOSIS — I10 Essential (primary) hypertension: Secondary | ICD-10-CM

## 2019-05-26 ENCOUNTER — Ambulatory Visit (INDEPENDENT_AMBULATORY_CARE_PROVIDER_SITE_OTHER): Payer: BC Managed Care – PPO | Admitting: Osteopathic Medicine

## 2019-05-26 ENCOUNTER — Other Ambulatory Visit: Payer: Self-pay

## 2019-05-26 ENCOUNTER — Encounter: Payer: Self-pay | Admitting: Osteopathic Medicine

## 2019-05-26 VITALS — BP 157/84 | HR 93 | Temp 98.2°F | Wt 156.1 lb

## 2019-05-26 DIAGNOSIS — Z23 Encounter for immunization: Secondary | ICD-10-CM | POA: Diagnosis not present

## 2019-05-26 DIAGNOSIS — I10 Essential (primary) hypertension: Secondary | ICD-10-CM

## 2019-05-26 DIAGNOSIS — L2082 Flexural eczema: Secondary | ICD-10-CM | POA: Diagnosis not present

## 2019-05-26 DIAGNOSIS — E876 Hypokalemia: Secondary | ICD-10-CM

## 2019-05-26 DIAGNOSIS — R635 Abnormal weight gain: Secondary | ICD-10-CM

## 2019-05-26 MED ORDER — TRIAMCINOLONE ACETONIDE 0.1 % EX OINT: 1.0000 "application " | TOPICAL_OINTMENT | Freq: Two times a day (BID) | CUTANEOUS | 6 refills | Status: AC

## 2019-05-26 MED ORDER — TRIAMTERENE-HCTZ 37.5-25 MG PO TABS: 1.0000 | ORAL_TABLET | Freq: Every day | ORAL | 1 refills | Status: DC

## 2019-05-26 MED ORDER — POTASSIUM CHLORIDE CRYS ER 10 MEQ PO TBCR: EXTENDED_RELEASE_TABLET | ORAL | 1 refills | Status: DC

## 2019-06-02 ENCOUNTER — Other Ambulatory Visit: Payer: Self-pay | Admitting: Osteopathic Medicine

## 2019-06-02 DIAGNOSIS — Z1231 Encounter for screening mammogram for malignant neoplasm of breast: Secondary | ICD-10-CM

## 2019-06-02 NOTE — Progress Notes (Signed)
HPI: Lindsay Moore is a 63 y.o. 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  she presents to [facility] today for chief complaint of:  Check up on HTN, labs   Doing well, here for routine 6 month f/u.   HTN:  Compliant with medications no CP, no SOB, no HA/VC, no dizziness. BP a bit above goal today, reports doing better at home w/ systolic typically 053Z occasionally 130s BP Readings from Last 3 Encounters:  05/26/19 (!) 157/84  11/21/18 135/83  06/06/18 (!) 157/84   Last labs reviewed, TG and LDL not ideal, we's discussed statin if no improvement, needs repeat labs   Requests refill for eczema  Concerned about some weight gain though she admits could eb better about diet/exercise.     At today's visit 06/02/19 ... PMH, PSH, FH reviewed and updated as needed.  Current medication list and allergy/intolerance hx reviewed and updated as needed. (See remainder of HPI, ROS, Phys Exam below)         ASSESSMENT/PLAN: The primary encounter diagnosis was Essential hypertension, benign. Diagnoses of Need for influenza vaccination, Low serum potassium, Flexural eczema, and Abnormal weight gain were also pertinent to this visit.  Get labs done!   Orders Placed This Encounter  Procedures  . Flu Vaccine QUAD 6+ mos PF IM (Fluarix Quad PF)  . COMPLETE METABOLIC PANEL WITH GFR  . LIPID SCREENING  . TSH  . CBC     Meds ordered this encounter  Medications  . potassium chloride (KLOR-CON) 10 MEQ tablet    Sig: TAKE 1 TABLET BY MOUTH ONCE DAILY    Dispense:  90 tablet    Refill:  1  . triamterene-hydrochlorothiazide (MAXZIDE-25) 37.5-25 MG tablet    Sig: Take 1 tablet by mouth daily.    Dispense:  90 tablet    Refill:  1  . triamcinolone ointment  (KENALOG) 0.1 %    Sig: Apply 1 application topically 2 (two) times daily. To affected areas    Dispense:  60 g    Refill:  6       Follow-up plan: Return for ANNUAL (call week prior to visit for lab orders).                                                 ################################################# ################################################# ################################################# #################################################    Current Meds  Medication Sig  . acetaminophen (TYLENOL) 650 MG CR tablet Take 1 tablet (650 mg total) by mouth every 8 (eight) hours as needed for pain.  Marland Kitchen aspirin EC 81 MG tablet Take 1 tablet (81 mg total) by mouth daily.  . Calcium Carb-Cholecalciferol (CALCIUM + D3 PO) Take by mouth.  Marland Kitchen MEGARED OMEGA-3 KRILL OIL PO Take by mouth.  . Multiple Vitamin (MULTIVITAMIN) tablet Take 1 tablet by mouth daily.  Marland Kitchen neomycin-polymyxin-hydrocortisone (CORTISPORIN) 3.5-10000-1 OTIC suspension Place 4 drops into the left ear 3 (three) times daily. For 7 days  . potassium chloride (KLOR-CON) 10 MEQ tablet TAKE 1 TABLET BY MOUTH ONCE DAILY  . triamcinolone ointment (KENALOG) 0.1 % Apply 1 application topically 2 (two) times daily. To affected areas  . triamterene-hydrochlorothiazide (MAXZIDE-25) 37.5-25 MG tablet Take 1 tablet by mouth daily.  . [  DISCONTINUED] potassium chloride (K-DUR) 10 MEQ tablet TAKE 1 TABLET BY MOUTH ONCE DAILY NEED  OFFICE  VISIT  . [DISCONTINUED] triamcinolone ointment (KENALOG) 0.1 % Apply 1 application topically 2 (two) times daily. To affected areas  . [DISCONTINUED] triamterene-hydrochlorothiazide (MAXZIDE-25) 37.5-25 MG tablet Take 1 tablet by mouth daily. NEEDS APPT    Allergies  Allergen Reactions  . Amlodipine     edema  . Bee Venom   . Ibuprofen     REACTION: breathing difficulty  . Lisinopril     tongue swelling       Review of  Systems:  Constitutional: No recent illness  HEENT: No  headache, no vision change  Cardiac: No  chest pain, No  pressure, No palpitations  Respiratory:  No  shortness of breath. No  Cough  Gastrointestinal: No  abdominal pain  Musculoskeletal: No new myalgia/arthralgia  Neurologic: No  weakness, No  Dizziness  Psychiatric: No  concerns with depression, No  concerns with anxiety  Exam:  BP (!) 157/84 (BP Location: Left Arm, Patient Position: Sitting, Cuff Size: Normal)   Pulse 93   Temp 98.2 F (36.8 C) (Oral)   Wt 156 lb 1.3 oz (70.8 kg)   BMI 26.38 kg/m   Constitutional: VS see above. General Appearance: alert, well-developed, well-nourished, NAD  Eyes: Normal lids and conjunctive, non-icteric sclera  Neck: No masses, trachea midline.   Respiratory: Normal respiratory effort. no wheeze, no rhonchi, no rales  Cardiovascular: S1/S2 normal, no murmur, no rub/gallop auscultated. RRR.   Musculoskeletal: Gait normal. Symmetric and independent movement of all extremities  Neurological: Normal balance/coordination. No tremor.  Skin: warm, dry, intact.   Psychiatric: Normal judgment/insight. Normal mood and affect. Oriented x3.       Visit summary with medication list and pertinent instructions was printed for patient to review, patient was advised to alert [place] if any updates are needed. All questions at time of visit were answered - patient instructed to contact office with any additional concerns. ER/RTC precautions were reviewed with the patient and understanding verbalized.   Note: Total time spent 25 minutes, greater than 50% of the visit was spent face-to-face counseling and coordinating care for the following: The primary encounter diagnosis was Essential hypertension, benign. Diagnoses of Need for influenza vaccination, Low serum potassium, Flexural eczema, and Abnormal weight gain were also pertinent to this visit.Marland Kitchen  Please note: voice recognition software was  used to produce this document, and typos may escape review. Please contact Dr. Lyn Hollingshead for any needed clarifications.    Follow up plan: Return for ANNUAL (call week prior to visit for lab orders).

## 2019-06-19 ENCOUNTER — Other Ambulatory Visit: Payer: Self-pay | Admitting: Osteopathic Medicine

## 2019-06-19 DIAGNOSIS — E876 Hypokalemia: Secondary | ICD-10-CM

## 2019-06-24 LAB — CBC
HCT: 43.3 % (ref 35.0–45.0)
Hemoglobin: 14.2 g/dL (ref 11.7–15.5)
MCH: 28.7 pg (ref 27.0–33.0)
MCHC: 32.8 g/dL (ref 32.0–36.0)
MCV: 87.7 fL (ref 80.0–100.0)
MPV: 10.9 fL (ref 7.5–12.5)
Platelets: 298 10*3/uL (ref 140–400)
RBC: 4.94 10*6/uL (ref 3.80–5.10)
RDW: 11.8 % (ref 11.0–15.0)
WBC: 6.5 10*3/uL (ref 3.8–10.8)

## 2019-06-24 LAB — COMPLETE METABOLIC PANEL WITH GFR
AG Ratio: 1.7 (calc) (ref 1.0–2.5)
ALT: 15 U/L (ref 6–29)
AST: 19 U/L (ref 10–35)
Albumin: 4.3 g/dL (ref 3.6–5.1)
Alkaline phosphatase (APISO): 83 U/L (ref 37–153)
BUN/Creatinine Ratio: 17 (calc) (ref 6–22)
BUN: 19 mg/dL (ref 7–25)
CO2: 28 mmol/L (ref 20–32)
Calcium: 9.8 mg/dL (ref 8.6–10.4)
Chloride: 100 mmol/L (ref 98–110)
Creat: 1.13 mg/dL — ABNORMAL HIGH (ref 0.50–0.99)
GFR, Est African American: 59 mL/min/{1.73_m2} — ABNORMAL LOW (ref >=60)
GFR, Est Non African American: 51 mL/min/{1.73_m2} — ABNORMAL LOW (ref >=60)
Globulin: 2.6 g/dL (calc) (ref 1.9–3.7)
Glucose, Bld: 98 mg/dL (ref 65–99)
Potassium: 3.5 mmol/L (ref 3.5–5.3)
Sodium: 139 mmol/L (ref 135–146)
Total Bilirubin: 0.6 mg/dL (ref 0.2–1.2)
Total Protein: 6.9 g/dL (ref 6.1–8.1)

## 2019-06-24 LAB — LIPID PANEL
Cholesterol: 243 mg/dL — ABNORMAL HIGH (ref <200)
HDL: 63 mg/dL (ref >=50)
LDL Cholesterol (Calc): 149 mg/dL (calc) — ABNORMAL HIGH
Non-HDL Cholesterol (Calc): 180 mg/dL (calc) — ABNORMAL HIGH (ref <130)
Total CHOL/HDL Ratio: 3.9 (calc) (ref <5.0)
Triglycerides: 177 mg/dL — ABNORMAL HIGH (ref <150)

## 2019-06-24 LAB — TSH: TSH: 1.04 mIU/L (ref 0.40–4.50)

## 2019-07-16 ENCOUNTER — Other Ambulatory Visit: Payer: Self-pay

## 2019-07-16 ENCOUNTER — Ambulatory Visit
Admission: RE | Admit: 2019-07-16 | Discharge: 2019-07-16 | Disposition: A | Discharge status: Home / Self Care | Payer: BC Managed Care – PPO | Source: Ambulatory Visit | Attending: Osteopathic Medicine | Admitting: Osteopathic Medicine

## 2019-07-16 DIAGNOSIS — Z1231 Encounter for screening mammogram for malignant neoplasm of breast: Secondary | ICD-10-CM

## 2019-11-24 ENCOUNTER — Ambulatory Visit (INDEPENDENT_AMBULATORY_CARE_PROVIDER_SITE_OTHER): Payer: Medicare PPO

## 2019-11-24 ENCOUNTER — Encounter: Payer: Self-pay | Admitting: Osteopathic Medicine

## 2019-11-24 ENCOUNTER — Other Ambulatory Visit: Payer: Self-pay

## 2019-11-24 ENCOUNTER — Ambulatory Visit (INDEPENDENT_AMBULATORY_CARE_PROVIDER_SITE_OTHER): Payer: Medicare PPO | Admitting: Osteopathic Medicine

## 2019-11-24 VITALS — BP 134/80 | HR 93 | Temp 97.8°F | Wt 152.1 lb

## 2019-11-24 DIAGNOSIS — Z13820 Encounter for screening for osteoporosis: Secondary | ICD-10-CM

## 2019-11-24 DIAGNOSIS — E876 Hypokalemia: Secondary | ICD-10-CM

## 2019-11-24 DIAGNOSIS — Z0000 Encounter for general adult medical examination without abnormal findings: Secondary | ICD-10-CM

## 2019-11-24 DIAGNOSIS — Z124 Encounter for screening for malignant neoplasm of cervix: Secondary | ICD-10-CM

## 2019-11-24 DIAGNOSIS — R7989 Other specified abnormal findings of blood chemistry: Secondary | ICD-10-CM

## 2019-11-24 DIAGNOSIS — M79671 Pain in right foot: Secondary | ICD-10-CM

## 2019-11-24 DIAGNOSIS — I10 Essential (primary) hypertension: Secondary | ICD-10-CM

## 2019-11-24 DIAGNOSIS — Z23 Encounter for immunization: Secondary | ICD-10-CM

## 2019-11-24 MED ORDER — TRIAMTERENE-HCTZ 37.5-25 MG PO TABS: 1.0000 | ORAL_TABLET | Freq: Every day | ORAL | 3 refills | Status: DC

## 2019-11-24 MED ORDER — POTASSIUM CHLORIDE CRYS ER 10 MEQ PO TBCR: EXTENDED_RELEASE_TABLET | ORAL | 3 refills | Status: DC

## 2019-11-24 NOTE — Progress Notes (Signed)
HPI: Lindsay Moore is a 64 y.o. 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  she presents to [facility] today, 11/24/19,  for chief complaint of: Annual physical, go over labs     Patient here for annual physical / wellness exam.  See preventive care reviewed as below.   Additional concerns today include:  Foot pain, recently hurt this w/ grandkid's toy        Past medical, surgical, social and family history reviewed:  Patient Active Problem List   Diagnosis Date Noted  . Flexural eczema 11/23/2016  . Primary osteoarthritis of both knees 08/31/2016  . Biceps tendinitis on right 06/22/2015  . Serous otitis media 01/06/2015  . Postmenopausal bleeding 08/17/2014  . History of abnormal cervical Pap smear 06/19/2014  . Lumbar strain 01/06/2014  . Breast mass 07/11/2013  . History of colonic polyps 06/19/2013  . TIA (transient ischemic attack) 06/10/2013  . Essential hypertension, benign 06/10/2013  . Septal defect 06/10/2013  . 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 Situational anxiety 06/10/2013  . Hyperlipidemia 06/10/2013    Past Surgical History:  Procedure Laterality Date  . bicep surgery Right   . broken hip  2000  . FOOT TENDON SURGERY  2000  . GALLBLADDER SURGERY    . orthoscopic knee surgery  10/2012  . TONSILLECTOMY  1959  . TUBAL LIGATION  1988    Social History   Tobacco Use  . Smoking status: Former Smoker    Packs/day: 1.00    Years: 25.00    Pack years: 25.00    Types: Cigarettes    Quit date: 07/03/1997    Years since quitting: 22.4  . Smokeless tobacco: Never Used  Substance Use Topics  . Alcohol use: Yes    Alcohol/week: 1.0 - 2.0 standard drinks    Types: 1 - 2 Standard drinks or equivalent per week    Family  History  Problem Relation Age of Onset  . Alcoholism Father   . Lung cancer Father   . Diabetes Other        grandmother  . Hyperlipidemia Mother   . Hypertension Mother   . Stroke Other        grandmother     Current medication list and allergy/intolerance information reviewed:    Current Outpatient Medications  Medication Sig Dispense Refill  . acetaminophen (TYLENOL) 650 MG CR tablet Take 1 tablet (650 mg total) by mouth every 8 (eight) hours as needed for pain. 90 tablet 3  . aspirin EC 81 MG tablet Take 1 tablet (81 mg total) by mouth daily. 150 tablet 2  . Calcium Carb-Cholecalciferol (CALCIUM + D3 PO) Take by mouth.    . clonazePAM (KLONOPIN) 0.5 MG tablet Take 0.5-1 tablets (0.25-0.5 mg total) by mouth 2 (two) times daily as needed for anxiety. (Patient not taking: Reported on 05/26/2019) 15 tablet 0  . ipratropium (ATROVENT) 0.06 % nasal spray Place 2 sprays into both nostrils 4 (four) times daily. (Patient not taking: Reported on 05/26/2019) 15 mL 1  . MEGARED OMEGA-3 KRILL OIL PO Take by mouth.    . Multiple Vitamin (MULTIVITAMIN) tablet Take 1 tablet by mouth daily.    Marland Kitchen neomycin-polymyxin-hydrocortisone (CORTISPORIN) 3.5-10000-1 OTIC suspension Place 4 drops into the left ear 3 (three) times daily. For 7 days 10 mL 0  . potassium chloride (KLOR-CON) 10  MEQ tablet TAKE 1 TABLET BY MOUTH ONCE DAILY 90 tablet 3  . ranitidine (ZANTAC) 150 MG tablet Take 150 mg by mouth 2 (two) times daily. Reported on 12/14/2015    . triamcinolone ointment (KENALOG) 0.1 % Apply 1 application topically 2 (two) times daily. To affected areas 60 g 6  . triamterene-hydrochlorothiazide (MAXZIDE-25) 37.5-25 MG tablet Take 1 tablet by mouth daily. 90 tablet 3   No current facility-administered medications for this visit.    Allergies  Allergen Reactions  . Amlodipine     edema  . Bee Venom   . Ibuprofen     REACTION: breathing difficulty  . Lisinopril     tongue swelling      Review of  Systems:  Constitutional:  No  fever, no chills, No recent illness, No unintentional weight changes. No significant fatigue.   HEENT: No  headache, no vision change, no hearing change, No sore throat, No  sinus pressure  Cardiac: No  chest pain, No  pressure, No palpitations, No  Orthopnea  Respiratory:  No  shortness of breath. No  Cough  Gastrointestinal: No  abdominal pain, No  nausea, No  vomiting,  No  blood in stool, No  diarrhea, No  constipation   Musculoskeletal: +new myalgia/arthralgia  Skin: No  Rash, No other wounds/concerning lesions  Genitourinary: No  incontinence, No  abnormal genital bleeding, No abnormal genital discharge  Hem/Onc: No  easy bruising/bleeding, No  abnormal lymph node  Endocrine: No cold intolerance,  No heat intolerance. No polyuria/polydipsia/polyphagia   Neurologic: No  weakness, No  dizziness, No  slurred speech/focal weakness/facial droop  Psychiatric: No  concerns with depression, No  concerns with anxiety, No sleep problems, No mood problems  Exam:  BP 134/80 (BP Location: Right Arm, Patient Position: Sitting, Cuff Size: Large)   Pulse 93   Temp 97.8 F (36.6 C) (Oral)   Wt 152 lb 1.9 oz (69 kg)   BMI 25.71 kg/m   Constitutional: VS see above. General Appearance: alert, well-developed, well-nourished, NAD  Eyes: Normal lids and conjunctive, non-icteric sclera  Ears, Nose, Mouth, Throat: TM normal bilaterally.   Neck: No masses, trachea midline. No thyroid enlargement. No tenderness/mass appreciated. No lymphadenopathy  Respiratory: Normal respiratory effort. no wheeze, no rhonchi, no rales  Cardiovascular: S1/S2 normal, no murmur, no rub/gallop auscultated. RRR. No lower extremity edema.   Gastrointestinal: Nontender, no masses. No hepatomegaly, no splenomegaly. No hernia appreciated. Bowel sounds normal. Rectal exam deferred.   Musculoskeletal: Gait normal. No clubbing/cyanosis of digits.   Neurological: Normal  balance/coordination. No tremor. No cranial nerve deficit on limited exam. Motor and sensation intact and symmetric. Cerebellar reflexes intact.   Skin: warm, dry, intact. No rash/ulcer. No concerning nevi or subq nodules on limited exam.    Psychiatric: Normal judgment/insight. Normal mood and affect. Oriented x3.      ASSESSMENT/PLAN: The primary encounter diagnosis was Annual physical exam. Diagnoses of Essential hypertension, benign, Low serum potassium, Elevated serum creatinine, Screening for osteoporosis, Need for 23-polyvalent pneumococcal polysaccharide vaccine, Cervical cancer screening, and Foot pain, right were also pertinent to this visit.    The 10-year ASCVD risk score Denman George DC Jr., et al., 2013) is: 8.1%   Values used to calculate the score:     Age: 62 years     Sex: Female     Is Non-Hispanic African American: No     Diabetic: No     Tobacco smoker: No     Systolic Blood Pressure: 134  mmHg     Is BP treated: Yes     HDL Cholesterol: 71 mg/dL     Total Cholesterol: 242 mg/dL  Orders Placed This Encounter  Procedures  . DG Bone Density  . DG Foot Complete Right  . CBC  . COMPLETE METABOLIC PANEL WITH GFR  . Lipid panel  . Urinalysis, Routine w reflex microscopic    Meds ordered this encounter  Medications  . triamterene-hydrochlorothiazide (MAXZIDE-25) 37.5-25 MG tablet    Sig: Take 1 tablet by mouth daily.    Dispense:  90 tablet    Refill:  3  . potassium chloride (KLOR-CON) 10 MEQ tablet    Sig: TAKE 1 TABLET BY MOUTH ONCE DAILY    Dispense:  90 tablet    Refill:  3    Patient Instructions  General Preventive Care  Most recent routine screening labs: ordered today.   Everyone should have blood pressure checked once per year. Goal 130/80 or less.   Tobacco: don't!   Alcohol: responsible moderation is ok for most adults - if you have concerns about your alcohol intake, please talk to me!   Exercise: as tolerated to reduce risk of  cardiovascular disease and diabetes. Strength training will also prevent osteoporosis.   Mental health: if need for mental health care (medicines, counseling, other), or concerns about moods, please let me know!   Sexual health: if need for STD testing, or if concerns with libido/pain problems, please let me know!   Advanced Directive: Living Will and/or Healthcare Power of Attorney recommended for all adults, regardless of age or health.  Vaccines  Flu vaccine: for almost everyone, every fall.   Shingles vaccine: all done.   Pneumonia vaccines: after age 3 - DUE  Tetanus booster: every 10 years - due 2028  COVID vaccine: STRONGLY RECOMMENDED  Cancer screenings   Colon cancer screening: Colonoscopy DUE 10/2024  Breast cancer screening: mammogram annually after age 88.   Cervical cancer screening: Pap every 1 to 5 years depending on age and other risk factors. Can usually stop at age 69 or w/ hysterectomy.   Lung cancer screening: not needed since quit >15 years ago  Infection screenings  . HIV: recommended screening at least once age 80-65 . Gonorrhea/Chlamydia: screening as needed . Hepatitis C: recommended once for everyone age 58-75 . TB: certain at-risk populations, or depending on work requirements and/or travel history Other . Bone Density Test: recommended for women at age 76 - DUE!      DG Foot Complete Right  Result Date: 11/24/2019 CLINICAL DATA:  Right foot pain, injury EXAM: RIGHT FOOT COMPLETE - 3+ VIEW COMPARISON:  None. FINDINGS: Fracture noted at the base of the right 3rd proximal phalanx. Small displaced fracture fragment. No subluxation or dislocation. Soft tissues are intact. IMPRESSION: Displaced small fracture fragment at the base of the right 3rd proximal phalanx. Electronically Signed   By: Charlett Nose M.D.   On: 11/24/2019 11:27      Visit summary with medication list and pertinent instructions was printed for patient to review. All questions at  time of visit were answered - patient instructed to contact office with any additional concerns or updates. ER/RTC precautions were reviewed with the patient.     Please note: voice recognition software was used to produce this document, and typos may escape review. Please contact Dr. Lyn Hollingshead for any needed clarifications.     Follow-up plan: Return in about 6 months (around 05/26/2020) for ROUTINE CHECK UP (call  week prior to visit for lab orders).

## 2019-11-24 NOTE — Patient Instructions (Signed)
General Preventive Care  Most recent routine screening labs: ordered today.   Everyone should have blood pressure checked once per year. Goal 130/80 or less.   Tobacco: don't!   Alcohol: responsible moderation is ok for most adults - if you have concerns about your alcohol intake, please talk to me!   Exercise: as tolerated to reduce risk of cardiovascular disease and diabetes. Strength training will also prevent osteoporosis.   Mental health: if need for mental health care (medicines, counseling, other), or concerns about moods, please let me know!   Sexual health: if need for STD testing, or if concerns with libido/pain problems, please let me know!   Advanced Directive: Living Will and/or Healthcare Power of Attorney recommended for all adults, regardless of age or health.  Vaccines  Flu vaccine: for almost everyone, every fall.   Shingles vaccine: all done.   Pneumonia vaccines: after age 70 - DUE  Tetanus booster: every 10 years - due 2028  COVID vaccine: STRONGLY RECOMMENDED  Cancer screenings   Colon cancer screening: Colonoscopy DUE 10/2024  Breast cancer screening: mammogram annually after age 28.   Cervical cancer screening: Pap every 1 to 5 years depending on age and other risk factors. Can usually stop at age 2 or w/ hysterectomy.   Lung cancer screening: not needed since quit >15 years ago  Infection screenings  . HIV: recommended screening at least once age 37-65 . Gonorrhea/Chlamydia: screening as needed . Hepatitis C: recommended once for everyone age 59-75 . TB: certain at-risk populations, or depending on work requirements and/or travel history Other . Bone Density Test: recommended for women at age 39 - DUE!

## 2019-11-25 ENCOUNTER — Telehealth: Payer: Self-pay | Admitting: Osteopathic Medicine

## 2019-11-25 LAB — LIPID PANEL
Cholesterol: 242 mg/dL — ABNORMAL HIGH (ref <200)
HDL: 71 mg/dL (ref >=50)
LDL Cholesterol (Calc): 148 mg/dL (calc) — ABNORMAL HIGH
Non-HDL Cholesterol (Calc): 171 mg/dL (calc) — ABNORMAL HIGH (ref <130)
Total CHOL/HDL Ratio: 3.4 (calc) (ref <5.0)
Triglycerides: 114 mg/dL (ref <150)

## 2019-11-25 LAB — COMPLETE METABOLIC PANEL WITH GFR
AG Ratio: 1.7 (calc) (ref 1.0–2.5)
ALT: 13 U/L (ref 6–29)
AST: 20 U/L (ref 10–35)
Albumin: 4.5 g/dL (ref 3.6–5.1)
Alkaline phosphatase (APISO): 93 U/L (ref 37–153)
BUN/Creatinine Ratio: 18 (calc) (ref 6–22)
BUN: 21 mg/dL (ref 7–25)
CO2: 28 mmol/L (ref 20–32)
Calcium: 9.8 mg/dL (ref 8.6–10.4)
Chloride: 99 mmol/L (ref 98–110)
Creat: 1.18 mg/dL — ABNORMAL HIGH (ref 0.50–0.99)
GFR, Est African American: 56 mL/min/{1.73_m2} — ABNORMAL LOW (ref >=60)
GFR, Est Non African American: 48 mL/min/{1.73_m2} — ABNORMAL LOW (ref >=60)
Globulin: 2.6 g/dL (calc) (ref 1.9–3.7)
Glucose, Bld: 100 mg/dL — ABNORMAL HIGH (ref 65–99)
Potassium: 3.9 mmol/L (ref 3.5–5.3)
Sodium: 138 mmol/L (ref 135–146)
Total Bilirubin: 0.7 mg/dL (ref 0.2–1.2)
Total Protein: 7.1 g/dL (ref 6.1–8.1)

## 2019-11-25 LAB — CBC
HCT: 44.3 % (ref 35.0–45.0)
Hemoglobin: 14.8 g/dL (ref 11.7–15.5)
MCH: 29.4 pg (ref 27.0–33.0)
MCHC: 33.4 g/dL (ref 32.0–36.0)
MCV: 88.1 fL (ref 80.0–100.0)
MPV: 10.3 fL (ref 7.5–12.5)
Platelets: 306 10*3/uL (ref 140–400)
RBC: 5.03 10*6/uL (ref 3.80–5.10)
RDW: 12.2 % (ref 11.0–15.0)
WBC: 7.3 10*3/uL (ref 3.8–10.8)

## 2019-11-25 LAB — URINALYSIS, ROUTINE W REFLEX MICROSCOPIC
Bilirubin Urine: NEGATIVE
Glucose, UA: NEGATIVE
Hgb urine dipstick: NEGATIVE
Ketones, ur: NEGATIVE
Leukocytes,Ua: NEGATIVE
Nitrite: NEGATIVE
Protein, ur: NEGATIVE
Specific Gravity, Urine: 1.019 (ref 1.001–1.03)
pH: 6 (ref 5.0–8.0)

## 2019-11-25 NOTE — Telephone Encounter (Signed)
Patient called to ask if Nurse was going to contact her about a nurse visit to be fitted for a shoe to be fitted on broken bone. She is not sure if she is supposed to make the appointment or if the nurse is going to call.

## 2019-11-25 NOTE — Telephone Encounter (Signed)
Imaging result stated: Pt would like a call to schedule NV for post-op shoe. Pt can be contacted at [phone]. Thanks. "  So, we can go ahead and call her to schedule. I would recommend letting Jeri Lager know when this is schedule so that we can make sure we have this in stock for patient.  Thanks!

## 2019-11-26 ENCOUNTER — Encounter: Payer: Self-pay | Admitting: Osteopathic Medicine

## 2019-11-26 NOTE — Telephone Encounter (Signed)
Please also see phone note from yesterday

## 2019-11-26 NOTE — Telephone Encounter (Signed)
Appointment has been made. No further questions at this time.  

## 2019-11-28 ENCOUNTER — Ambulatory Visit: Payer: Medicare PPO

## 2019-11-28 NOTE — Telephone Encounter (Signed)
Task completed. Pt has been scheduled a NV on 11/28/19. Post-op boot and completed Djo paperwork handed to [facility] in Triage.

## 2019-12-03 ENCOUNTER — Other Ambulatory Visit: Payer: Medicare PPO

## 2019-12-04 ENCOUNTER — Ambulatory Visit (INDEPENDENT_AMBULATORY_CARE_PROVIDER_SITE_OTHER): Payer: Medicare PPO

## 2019-12-04 ENCOUNTER — Other Ambulatory Visit: Payer: Self-pay

## 2019-12-04 DIAGNOSIS — Z13820 Encounter for screening for osteoporosis: Secondary | ICD-10-CM

## 2019-12-04 DIAGNOSIS — Z0000 Encounter for general adult medical examination without abnormal findings: Secondary | ICD-10-CM | POA: Diagnosis not present

## 2019-12-30 ENCOUNTER — Ambulatory Visit (INDEPENDENT_AMBULATORY_CARE_PROVIDER_SITE_OTHER): Payer: Medicare PPO

## 2019-12-30 ENCOUNTER — Ambulatory Visit (INDEPENDENT_AMBULATORY_CARE_PROVIDER_SITE_OTHER): Payer: Medicare PPO | Admitting: Osteopathic Medicine

## 2019-12-30 ENCOUNTER — Other Ambulatory Visit: Payer: Self-pay | Admitting: Osteopathic Medicine

## 2019-12-30 ENCOUNTER — Other Ambulatory Visit: Payer: Self-pay

## 2019-12-30 ENCOUNTER — Encounter: Payer: Self-pay | Admitting: Osteopathic Medicine

## 2019-12-30 VITALS — BP 137/89 | HR 85 | Wt 153.0 lb

## 2019-12-30 DIAGNOSIS — S92531A Displaced fracture of distal phalanx of right lesser toe(s), initial encounter for closed fracture: Secondary | ICD-10-CM | POA: Diagnosis not present

## 2019-12-30 DIAGNOSIS — S92911A Unspecified fracture of right toe(s), initial encounter for closed fracture: Secondary | ICD-10-CM | POA: Insufficient documentation

## 2019-12-30 DIAGNOSIS — S92901D Unspecified fracture of right foot, subsequent encounter for fracture with routine healing: Secondary | ICD-10-CM

## 2019-12-30 HISTORY — DX: Unspecified fracture of right toe(s), initial encounter for closed fracture: S92.911A

## 2019-12-30 NOTE — Progress Notes (Signed)
Lindsay Moore is a 64 y.o. female who presents to  [facility]  today, 12/30/19, seeking care for the following:  . 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 Pt would like to avoid surgery if possible, but I'm not encouraged given that it's not healing.      ASSESSMENT & PLAN with other pertinent findings:  The encounter diagnosis was Closed displaced fracture of distal phalanx of lesser toe of right foot, initial encounter.     Orders Placed This Encounter  Procedures  . DG Foot Complete Left   DG Foot Complete Right  Result Date: 12/30/2019 CLINICAL DATA:  Follow-up fracture, still having pains at third toe EXAM: RIGHT FOOT COMPLETE - 3+ VIEW COMPARISON:  11/24/2019 FINDINGS: Osseous demineralization. Joint spaces preserved. Again identified displaced fracture at base of proximal phalanx RIGHT third toe medially unchanged, extends intra-articular. No additional fracture, dislocation, or bone destruction. IMPRESSION: No interval change in appearance of intra-articular fracture at medial aspect base of proximal phalanx RIGHT third toe medially. Electronically Signed   By: Ulyses Southward M.D.   On: 12/30/2019 14:41       Follow-up instructions: Return in about 4 weeks (around 01/27/2020) for recheck foot fracture w/ Dr T .                                         BP 137/89 (BP Location: Right Arm, Patient Position: Sitting)   Pulse 85   Wt 153 lb (69.4 kg)   SpO2 96%   BMI 25.86 kg/m   Current Meds  Medication Sig  . acetaminophen (TYLENOL) 650 MG CR tablet Take 1 tablet (650 mg total) by mouth every 8 (eight) hours as needed for pain.  Marland Kitchen aspirin EC 81 MG tablet Take 1 tablet (81 mg total) by mouth daily.  . Calcium Carb-Cholecalciferol (CALCIUM + D3 PO) Take by mouth.  Marland Kitchen MEGARED OMEGA-3 KRILL OIL PO Take by mouth.  .  potassium chloride (KLOR-CON) 10 MEQ tablet TAKE 1 TABLET BY MOUTH ONCE DAILY  . triamcinolone ointment (KENALOG) 0.1 % Apply 1 application topically 2 (two) times daily. To affected areas  . triamterene-hydrochlorothiazide (MAXZIDE-25) 37.5-25 MG tablet Take 1 tablet by mouth daily.    No results found for this or any previous visit (from the past 72 hour(s)).  DG Foot Complete Right  Result Date: 12/30/2019 CLINICAL DATA:  Follow-up fracture, still having pains at third toe EXAM: RIGHT FOOT COMPLETE - 3+ VIEW COMPARISON:  11/24/2019 FINDINGS: Osseous demineralization. Joint spaces preserved. Again identified displaced fracture at base of proximal phalanx RIGHT third toe medially unchanged, extends intra-articular. No additional fracture, dislocation, or bone destruction. IMPRESSION: No interval change in appearance of intra-articular fracture at medial aspect base of proximal phalanx RIGHT third toe medially. Electronically Signed   By: Ulyses Southward M.D.   On: 12/30/2019 14:41       All questions at time of visit were answered - patient instructed to contact office with any additional concerns or updates.  ER/RTC precautions were reviewed with the patient as applicable.   Please note: voice recognition software was used to produce this document, and typos may escape review. Please contact Dr. Lyn Hollingshead for any needed clarifications.

## 2019-12-30 NOTE — Progress Notes (Signed)
xr order

## 2020-01-02 ENCOUNTER — Encounter: Payer: Self-pay | Admitting: Osteopathic Medicine

## 2020-01-06 ENCOUNTER — Encounter: Payer: Self-pay | Admitting: Osteopathic Medicine

## 2020-01-09 NOTE — Telephone Encounter (Signed)
See message - needs rescheduled w/ Dr T - 4 weeks from her last visit w/ me. Thanks!

## 2020-01-13 ENCOUNTER — Ambulatory Visit: Payer: Medicare PPO | Admitting: Sports Medicine

## 2020-01-27 ENCOUNTER — Other Ambulatory Visit: Payer: Self-pay

## 2020-01-27 ENCOUNTER — Ambulatory Visit (INDEPENDENT_AMBULATORY_CARE_PROVIDER_SITE_OTHER): Payer: Medicare PPO

## 2020-01-27 ENCOUNTER — Ambulatory Visit (INDEPENDENT_AMBULATORY_CARE_PROVIDER_SITE_OTHER): Payer: Medicare PPO | Admitting: Sports Medicine

## 2020-01-27 DIAGNOSIS — S92911A Unspecified fracture of right toe(s), initial encounter for closed fracture: Secondary | ICD-10-CM

## 2020-01-27 DIAGNOSIS — S92511A Displaced fracture of proximal phalanx of right lesser toe(s), initial encounter for closed fracture: Secondary | ICD-10-CM | POA: Diagnosis not present

## 2020-01-27 DIAGNOSIS — S92531A Displaced fracture of distal phalanx of right lesser toe(s), initial encounter for closed fracture: Secondary | ICD-10-CM | POA: Diagnosis not present

## 2020-01-27 NOTE — Progress Notes (Signed)
    Procedures performed today:    None.  Independent interpretation of notes and tests performed by another provider:   I reviewed the X-ray and there is a small avulsion fracture at the base of the third phalanx.   Brief History, Exam, Impression, and Recommendations:    Lindsay Moore is a pleasant 64yo female who presents for follow up of a fracture of her right third proximal phalanx at the end of May. She has been buddy taping the toe and wearing a post-op shoe. She says the pain is 3/10. On exam there is tenderness at the base of her proximal 3rd phalanx. She expressed that she did not want to wear the shoe anymore. We are going to discontinue the use of her shoe and continue with buddy taping. She can take tylenol to reduce inflammation. She can follow up as needed.   Aurelio Jew, MS3     ___________________________________________ Ihor Austin. Benjamin Stain, M.D., ABFM., CAQSM. Primary Care and Sports Medicine [facility]  Adjunct Instructor of Family Medicine  [employer]

## 2020-01-27 NOTE — Assessment & Plan Note (Addendum)
2 months post fracture, minimal pain at the MTP, continuing to improve, fracture fragment is tiny. X-rays personally reviewed. Continue buddy taping, this will likely hurt for another couple of months. If persistent discomfort we can inject the joint, otherwise no further intervention needed. Discontinue postop shoe.

## 2020-02-27 ENCOUNTER — Emergency Department
Admission: EM | Admit: 2020-02-27 | Discharge: 2020-02-27 | Disposition: A | Discharge status: Home / Self Care | Payer: Medicare PPO | Source: Home / Self Care

## 2020-02-27 ENCOUNTER — Emergency Department (INDEPENDENT_AMBULATORY_CARE_PROVIDER_SITE_OTHER): Payer: Medicare PPO

## 2020-02-27 ENCOUNTER — Encounter: Payer: Self-pay | Admitting: *Deleted

## 2020-02-27 ENCOUNTER — Other Ambulatory Visit: Payer: Self-pay

## 2020-02-27 DIAGNOSIS — R0602 Shortness of breath: Secondary | ICD-10-CM

## 2020-02-27 DIAGNOSIS — R5383 Other fatigue: Secondary | ICD-10-CM | POA: Diagnosis not present

## 2020-02-27 DIAGNOSIS — Z20822 Contact with and (suspected) exposure to covid-19: Secondary | ICD-10-CM | POA: Diagnosis not present

## 2020-02-27 DIAGNOSIS — J449 Chronic obstructive pulmonary disease, unspecified: Secondary | ICD-10-CM

## 2020-02-27 DIAGNOSIS — Z20828 Contact with and (suspected) exposure to other viral communicable diseases: Secondary | ICD-10-CM | POA: Diagnosis not present

## 2020-02-27 DIAGNOSIS — R509 Fever, unspecified: Secondary | ICD-10-CM

## 2020-02-27 DIAGNOSIS — J441 Chronic obstructive pulmonary disease with (acute) exacerbation: Secondary | ICD-10-CM

## 2020-02-27 DIAGNOSIS — R4589 Other symptoms and signs involving emotional state: Secondary | ICD-10-CM | POA: Diagnosis not present

## 2020-02-27 DIAGNOSIS — R630 Anorexia: Secondary | ICD-10-CM | POA: Diagnosis not present

## 2020-02-27 MED ORDER — HYDROXYZINE HCL 25 MG PO TABS: 25.0000 mg | ORAL_TABLET | Freq: Three times a day (TID) | ORAL | 0 refills | Status: DC | PRN

## 2020-02-27 MED ORDER — ALBUTEROL SULFATE HFA 108 (90 BASE) MCG/ACT IN AERS: 1.0000 | INHALATION_SPRAY | Freq: Four times a day (QID) | RESPIRATORY_TRACT | 0 refills | Status: DC | PRN

## 2020-02-27 NOTE — ED Notes (Signed)
Called x1

## 2020-02-27 NOTE — ED Provider Notes (Signed)
[facility]    CSN: [id number] Arrival date & time: 02/27/20  1257      History   Chief Complaint Chief Complaint  Patient presents with  . Covid Exposure  . Fatigue  . Shortness of Breath    HPI Lindsay Moore is a 64 y.o. female.   HPI Lindsay Moore is a 64 y.o. female presenting to UC with concern for close exposure to Covid on 8/15, developed symptoms of fever, chills, fatigue on 02/17/20. She was unable to get tested due to long wait, was able to get an appointment at a [facility] but when she went to be tested the clinic was temporarily closed. Pt has been isolating at home. Today developed loss of appetite, has had loss of taste.  Today chest tightness, mild SOB and fatigue. No known hx of asthma or COPD. Former cigarette smoker. Mother had COPD.  Pt has not received Covid-19 vaccine.  Pt reports feeling anxious about likely having Covid.  She has not called to schedule an appointment with her PCP as she thought she was improving from last week.  Pt does report hx of anxiety in the past, was prescribed medication by her PCP but was too nervous to take it at the time, never wound up trying the medication.    Past Medical History:  Diagnosis Date  . AC (acromioclavicular) joint bone spurs   . Essential hypertension, benign 06/10/2013  . 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 Hyperlipidemia 06/10/2013  . 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 Right knee pain 06/10/2013   Workers Comp: [facility] Dr. Luiz Blare     Patient Active Problem List   Diagnosis Date Noted  . Closed displaced fracture of proximal phalanx of third toe of right foot 12/30/2019  . Flexural eczema 11/23/2016  . Primary osteoarthritis of both knees 08/31/2016  . Biceps tendinitis on right 06/22/2015  . Serous otitis media 01/06/2015  . Postmenopausal bleeding 08/17/2014  . History of abnormal cervical Pap smear 06/19/2014  . Lumbar strain 01/06/2014  . Breast mass 07/11/2013  .  History of colonic polyps 06/19/2013  . TIA (transient ischemic attack) 06/10/2013  . Essential hypertension, benign 06/10/2013  . Septal defect 06/10/2013  . 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 Situational anxiety 06/10/2013  . Hyperlipidemia 06/10/2013    Past Surgical History:  Procedure Laterality Date  . bicep surgery Right   . broken hip  2000  . FOOT TENDON SURGERY  2000  . GALLBLADDER SURGERY    . orthoscopic knee surgery  10/2012  . TONSILLECTOMY  1959  . TUBAL LIGATION  1988    OB History    Gravida  6   Para  5   Term      Preterm  2   AB      Living  5     SAB      TAB      Ectopic      Multiple      Live Births               Home Medications    Prior to Admission medications   Medication Sig Start Date End Date Taking? Authorizing Provider  acetaminophen (TYLENOL) 650 MG CR tablet Take 1 tablet (650 mg total) by mouth every 8 (eight) hours as needed for pain. 12/07/16   Monica Becton, MD  albuterol (VENTOLIN HFA) 108 (90 Base) MCG/ACT inhaler Inhale 1-2 puffs into the lungs every 6 (  six) hours as needed for wheezing or shortness of breath. 02/27/20   Lurene Shadow, PA-C  aspirin EC 81 MG tablet Take 1 tablet (81 mg total) by mouth daily. 06/10/13   Laren Boom, DO  Calcium Carb-Cholecalciferol (CALCIUM + D3 PO) Take by mouth.    [provider]  hydrOXYzine (ATARAX/VISTARIL) 25 MG tablet Take 1 tablet (25 mg total) by mouth every 8 (eight) hours as needed for anxiety. 02/27/20   Janyce Ellinger O, PA-C  MEGARED OMEGA-3 KRILL OIL PO Take by mouth.    [provider]  Multiple Vitamin (MULTIVITAMIN) tablet Take 1 tablet by mouth daily.    [provider]  potassium chloride (KLOR-CON) 10 MEQ tablet TAKE 1 TABLET BY MOUTH ONCE DAILY 11/24/19   Sunnie Nielsen, DO  triamcinolone ointment (KENALOG) 0.1 % Apply 1 application topically 2 (two) times daily. To affected areas 05/26/19   Sunnie Nielsen, DO    triamterene-hydrochlorothiazide (MAXZIDE-25) 37.5-25 MG tablet Take 1 tablet by mouth daily. 11/24/19   Sunnie Nielsen, DO    Family History Family History  Problem Relation Age of Onset  . Alcoholism Father   . Lung cancer Father   . Diabetes Other        grandmother  . Hyperlipidemia Mother   . Hypertension Mother   . Stroke Other        grandmother    Social History Social History   Tobacco Use  . Smoking status: Former Smoker    Packs/day: 1.00    Years: 25.00    Pack years: 25.00    Types: Cigarettes    Quit date: 07/03/1997    Years since quitting: 22.6  . Smokeless tobacco: Never Used  Vaping Use  . Vaping Use: Never used  Substance Use Topics  . Alcohol use: Yes    Alcohol/week: 1.0 - 2.0 standard drink    Types: 1 - 2 Standard drinks or equivalent per week  . Drug use: No     Allergies   Amlodipine, Bee venom, Ibuprofen, and Lisinopril   Review of Systems Review of Systems  Constitutional: Positive for appetite change (decreased) and fatigue. Negative for chills and fever.  HENT: Negative for congestion, ear pain, sore throat, trouble swallowing and voice change.   Respiratory: Positive for chest tightness and shortness of breath. Negative for cough.   Cardiovascular: Negative for chest pain and palpitations.  Gastrointestinal: Negative for abdominal pain, diarrhea, nausea and vomiting.  Musculoskeletal: Negative for arthralgias, back pain and myalgias.  Skin: Negative for rash.  All other systems reviewed and are negative.    Physical Exam Triage Vital Signs ED Triage Vitals  Enc Vitals Group     BP 02/27/20 1448 (!) 149/95     Pulse Rate 02/27/20 1448 (!) 105     Resp --      Temp 02/27/20 1448 98.6 F (37 C)     Temp Source 02/27/20 1448 Oral     SpO2 02/27/20 1448 99 %     Weight --      Height --      Head Circumference --      Peak Flow --      Pain Score 02/27/20 1444 0     Pain Loc --      Pain Edu? --      Excl. in GC? --     No data found.  Updated Vital Signs BP (!) 149/95 (BP Location: Left Arm)   Pulse 73   Temp  98.6 F (37 C) (Oral)   SpO2 98%   Visual Acuity Right Eye Distance:   Left Eye Distance:   Bilateral Distance:    Right Eye Near:   Left Eye Near:    Bilateral Near:     Physical Exam Vitals and nursing note reviewed.  Constitutional:      General: She is not in acute distress.    Appearance: She is well-developed. She is not ill-appearing, toxic-appearing or diaphoretic.  HENT:     Head: Normocephalic and atraumatic.     Right Ear: Tympanic membrane and ear canal normal.     Left Ear: Tympanic membrane and ear canal normal.     Nose: Nose normal.     Right Sinus: No maxillary sinus tenderness or frontal sinus tenderness.     Left Sinus: No maxillary sinus tenderness or frontal sinus tenderness.     Mouth/Throat:     Lips: Pink.     Mouth: Mucous membranes are moist.     Pharynx: Oropharynx is clear. Uvula midline. No pharyngeal swelling, oropharyngeal exudate, posterior oropharyngeal erythema or uvula swelling.  Neck:     Thyroid: No thyromegaly.  Cardiovascular:     Rate and Rhythm: Normal rate and regular rhythm.  Pulmonary:     Effort: Pulmonary effort is normal.     Breath sounds: Normal breath sounds. No decreased breath sounds, wheezing, rhonchi or rales.  Musculoskeletal:        General: Normal range of motion.     Cervical back: Normal range of motion and neck supple.  Lymphadenopathy:     Cervical: No cervical adenopathy.  Skin:    General: Skin is warm and dry.  Neurological:     Mental Status: She is alert and oriented to person, place, and time.  Psychiatric:        Behavior: Behavior normal.      UC Treatments / Results  Labs (all labs ordered are listed, but only abnormal results are displayed) Labs Reviewed  SARS-COV-2 RNA,(COVID-19) QUALITATIVE NAAT    EKG   Radiology DG Chest 2 View  Result Date: 02/27/2020 CLINICAL DATA:  Short of  breath fatigue fever.  COVID exposure EXAM: CHEST - 2 VIEW COMPARISON:  None. FINDINGS: COPD with mild hyperinflation lungs. Negative for infiltrate or effusion. Negative for heart failure. Heart size normal. Lungs are clear. IMPRESSION: Mild COPD.  No acute abnormality. Electronically Signed   By: Marlan Palauharles  Clark M.D.   On: 02/27/2020 15:28    Procedures Procedures (including critical care time)  Medications Ordered in UC Medications - No data to display  Initial Impression / Assessment and Plan / UC Course  I have reviewed the triage vital signs and the nursing notes.  Pertinent labs & imaging results that were available during my care of the patient were reviewed by me and considered in my medical decision making (see chart for details).    Covid-19 test from today pending Reviewed imaging with pt Will have pt try trial of albuterol for SOB with mild COPD noted on today's CXR Will also have pt try trial of atarax for anxiety  Encouraged to get pulse/ox for home use Close communication and f/u with her PCP next week Discussed symptoms that warrant emergent care in the ED. AVS given  Final Clinical Impressions(s) / UC Diagnoses   Final diagnoses:  Close exposure to COVID-19 virus  Loss of appetite  Shortness of breath  COPD, mild (HCC)  COPD exacerbation (HCC)  Feeling anxious  Discharge Instructions      Atarax (hydroxizine) is an antihistamine that can be taken to help with itching. This medication can cause drowsiness so do not drive or drink alcohol while taking.    Please keep in contact with Dr. Lyn Hollingshead about how you are feeling and schedule a follow up appointment with her next week for recheck of symptoms.  It is recommended you order a pulse/oxygen (pulse/ox) machine you can use at home to make sure your heart rate stays between 60-100 while at rest and your oxygen stays above 94%. This is an easy way to monitor your health at home.     Call 911 or have  someone drive you to the hospital if symptoms significantly worsening.  Due to concern for possibly having Covid-19, it is advised that you self-isolate at home until test results come back, usually 2-3 days.  If positive, it is recommended you stay isolated for at least 10 days after symptom onset and 24 hours after last fever without taking medication (whichever is longer).  If you MUST go out, please wear a mask at all times, limit contact with others.   If your test is negative, you still have plenty of time to get the Covid vaccine. It is recommended you schedule an appointment to get your vaccine once you get over this current illness.  Please ask your primary care provider about any questions/concerns related to the vaccine.      ED Prescriptions    Medication Sig Dispense Auth. Provider   hydrOXYzine (ATARAX/VISTARIL) 25 MG tablet Take 1 tablet (25 mg total) by mouth every 8 (eight) hours as needed for anxiety. 8 tablet Doroteo Glassman, Degan Hanser O, PA-C   albuterol (VENTOLIN HFA) 108 (90 Base) MCG/ACT inhaler Inhale 1-2 puffs into the lungs every 6 (six) hours as needed for wheezing or shortness of breath. 1 each Lurene Shadow, PA-C     PDMP not reviewed this encounter.   Lurene Shadow, [place] 02/27/20 1747

## 2020-02-27 NOTE — ED Triage Notes (Addendum)
Patient reports she was exposed to COVID 2 sundays ago. States family tested positive. States lost smell. Did not have official test. Had fever and chills.   States that she was feeling okay throughout this week but today she felt fatigued, anorexia and shortness of breath.   Patient with mild anxiety over situation.

## 2020-02-27 NOTE — Discharge Instructions (Signed)
  Atarax (hydroxizine) is an antihistamine that can be taken to help with itching. This medication can cause drowsiness so do not drive or drink alcohol while taking.    Please keep in contact with Dr. Lyn Hollingshead about how you are feeling and schedule a follow up appointment with her next week for recheck of symptoms.  It is recommended you order a pulse/oxygen (pulse/ox) machine you can use at home to make sure your heart rate stays between 60-100 while at rest and your oxygen stays above 94%. This is an easy way to monitor your health at home.     Call 911 or have someone drive you to the hospital if symptoms significantly worsening.  Due to concern for possibly having Covid-19, it is advised that you self-isolate at home until test results come back, usually 2-3 days.  If positive, it is recommended you stay isolated for at least 10 days after symptom onset and 24 hours after last fever without taking medication (whichever is longer).  If you MUST go out, please wear a mask at all times, limit contact with others.   If your test is negative, you still have plenty of time to get the Covid vaccine. It is recommended you schedule an appointment to get your vaccine once you get over this current illness.  Please ask your primary care provider about any questions/concerns related to the vaccine.

## 2020-02-28 ENCOUNTER — Telehealth: Payer: Self-pay | Admitting: Emergency Medicine

## 2020-02-28 NOTE — Telephone Encounter (Signed)
Patient states feeling somewhat better today; has not needed inhaler or anti-anxiety medication yet. Told her to call if she has any concerns today during 8-4 hours; ER after that.

## 2020-02-29 LAB — SARS-COV-2 RNA,(COVID-19) QUALITATIVE NAAT: SARS CoV2 RNA: DETECTED — CR

## 2020-03-04 ENCOUNTER — Telehealth (INDEPENDENT_AMBULATORY_CARE_PROVIDER_SITE_OTHER): Payer: Medicare PPO | Admitting: Nurse Practitioner

## 2020-03-04 ENCOUNTER — Encounter: Payer: Self-pay | Admitting: Nurse Practitioner

## 2020-03-04 VITALS — HR 98

## 2020-03-04 DIAGNOSIS — J441 Chronic obstructive pulmonary disease with (acute) exacerbation: Secondary | ICD-10-CM | POA: Diagnosis not present

## 2020-03-04 DIAGNOSIS — Z719 Counseling, unspecified: Secondary | ICD-10-CM | POA: Diagnosis not present

## 2020-03-04 HISTORY — DX: Chronic obstructive pulmonary disease with (acute) exacerbation: J44.1

## 2020-03-04 HISTORY — DX: Counseling, unspecified: Z71.9

## 2020-03-04 NOTE — Progress Notes (Signed)
Virtual Visit via Telephone Note  I connected with  Shterna Laramee on 03/04/20 at  8:50 AM EDT by telephone and verified that I am speaking with the correct person using two identifiers.   I discussed the limitations, risks, security and privacy concerns of performing an evaluation and management service by telephone and the availability of in person appointments. I also discussed with the patient that there may be a patient responsible charge related to this service. The patient expressed understanding and agreed to proceed.  The patient is: at home I am: in the office  Subjective:    CC: Follow-up from Urgent Care Visit 08/27  HPI: Patient presents today for follow-up from Urgent Care visit. She presented to UC on 08/27 after exposure to COVID-19 on 08/17.  She reports that at the time of the visit she was Covid positive and a chest x-ray did show indications of COPD.  She was given albuterol inhaler and told to follow-up with her PCP.  Time of the visit today she reports she is feeling much better.  She reports her energy is back, she is eating well, and she has gained back 3 of the 10 pounds that she lost while ill.  She reports she is able to take her regular medication.  She has been utilizing vitamin C 500 mg in addition to her multivitamin to help build her immune system.  She denies shortness of breath, chest pain, dizziness, palpitations, fever, nausea, vomiting, diarrhea.  She endorses that she is getting her sense of taste and smell back.  She does have many questions about the COPD diagnosis and what this means for her in the future.  She reports she has not had her Covid vaccine and has a lot of questions concerning the vaccination and the risks associated with it.  Past medical history, Surgical history, Family history not pertinant except as noted below, Social history, Allergies, and medications have been entered into the medical record, reviewed, and corrections made.    Review of Systems:  No fevers, chills, night sweats, weight loss, chest pain, or shortness of breath.    Objective:    General: Speaking clearly in complete sentences without any shortness of breath.  Alert and oriented x3.  Normal judgment. No apparent acute distress.  Impression and Recommendations:    1. Chronic obstructive pulmonary disease with acute exacerbation (HCC) New diagnosis of COPD with recent acute exacerbation related to COVID-19 virus.  Patient is significantly improved from her original symptoms and is recovering nicely. Significant time spent on education of the disease process of COPD and treatment measures to help reduce exacerbation and rapid progression of the disease.  Discussed stepwise approach to treatment based on symptoms and the use of short acting bronchodilator treatment for exacerbation and breakthrough symptoms. Given the new diagnosis of COPD discussed with the patient her increased risks of severe illness and disease progression if she is subsequently acted with the COVID-19 virus. Recommend patient consider immunization after complete recovery of current illness to prevent future infection. Discussed symptoms associated with disease progression that would warrant further evaluation and change in management. Discussed environmental factors that can worsen or exacerbate COPD and the importance of avoidance of these triggers. Patient provided with further information on AVS on new diagnosis. Recommend follow-up with PCP.  She does have an appointment in November.  2. Health education Significant and prolonged discussion on COVID-19 vaccination including indications for the vaccination, complications associated with the vaccination, pathophysiology associated with the  vaccination, ans statistical evidence of vaccine efficacy. Expressed understanding for concerns associated with the vaccine, however, strongly encouraged the patient to consider vaccination to  protect her from future illness and increased risk of severe morbidity and mortality. Discussed at length the increased risks associated with comorbid conditions including COPD, hypertension, hyperlipidemia, history of TIA, and advancing age.  Patient provided with commonly asked questions and answers from reliable sources, reliable resources to look up information on her own and encouraged questions about the vaccine and the virus. Patient expressed gratitude for the time and explanation spent with her today.  She is considering getting the vaccine once she has fully recovered from her current illness.  Recommend she wait 90 days from the onset of her symptoms before obtaining initial vaccination.  Follow-up with PCP in November for COPD.  Feel free to reach out with any questions or concerns about COVID-19 vaccination.    I discussed the assessment and treatment plan with the patient. The patient was provided an opportunity to ask questions and all were answered. The patient agreed with the plan and demonstrated an understanding of the instructions.   The patient was advised to call back or seek an in-person evaluation if the symptoms worsen or if the condition fails to improve as anticipated.  I provided 60 minutes of non-face-to-face time during this TELEPHONE encounter.   Greater than 50% of the 60 minutes on this visit spent with education, counseling, and answering questions associated with new diagnosis of COPD and concerns around COVID-19 vaccination and illness.  Tollie Eth, NP

## 2020-03-04 NOTE — Patient Instructions (Addendum)
Chronic Obstructive Pulmonary Disease Chronic obstructive pulmonary disease (COPD) is a long-term (chronic) lung problem. When you have COPD, it is hard for air to get in and out of your lungs. Usually the condition gets worse over time, and your lungs will never return to normal. There are things you can do to keep yourself as healthy as possible.  Your doctor may treat your condition with: ? Medicines. ? Oxygen. ? Lung surgery.  Your doctor may also recommend: ? Rehabilitation. This includes steps to make your body work better. It may involve a team of specialists. ? Quitting smoking, if you smoke. ? Exercise and changes to your diet. ? Comfort measures (palliative care). Follow these instructions at home: Medicines  Take over-the-counter and prescription medicines only as told by your doctor.  Talk to your doctor before taking any cough or allergy medicines. You may need to avoid medicines that cause your lungs to be dry. Lifestyle  If you smoke, stop. Smoking makes the problem worse. If you need help quitting, ask your doctor.  Avoid being around things that make your breathing worse. This may include smoke, chemicals, and fumes.  Stay active, but remember to rest as well.  Learn and use tips on how to relax.  Make sure you get enough sleep. Most adults need at least 7 hours of sleep every night.  Eat healthy foods. Eat smaller meals more often. Rest before meals. Controlled breathing Learn and use tips on how to control your breathing as told by your doctor. Try:  Breathing in (inhaling) through your nose for 1 second. Then, pucker your lips and breath out (exhale) through your lips for 2 seconds.  Putting one hand on your belly (abdomen). Breathe in slowly through your nose for 1 second. Your hand on your belly should move out. Pucker your lips and breathe out slowly through your lips. Your hand on your belly should move in as you breathe out.  Controlled  coughing Learn and use controlled coughing to clear mucus from your lungs. Follow these steps: 1. Lean your head a little forward. 2. Breathe in deeply. 3. Try to hold your breath for 3 seconds. 4. Keep your mouth slightly open while coughing 2 times. 5. Spit any mucus out into a tissue. 6. Rest and do the steps again 1 or 2 times as needed. General instructions  Make sure you get all the shots (vaccines) that your doctor recommends. Ask your doctor about a flu shot and a pneumonia shot.  Use oxygen therapy and pulmonary rehabilitation if told by your doctor. If you need home oxygen therapy, ask your doctor if you should buy a tool to measure your oxygen level (oximeter).  Make a COPD action plan with your doctor. This helps you to know what to do if you feel worse than usual.  Manage any other conditions you have as told by your doctor.  Avoid going outside when it is very hot, cold, or humid.  Avoid people who have a sickness you can catch (contagious).  Keep all follow-up visits as told by your doctor. This is important. Contact a doctor if:  You cough up more mucus than usual.  There is a change in the color or thickness of the mucus.  It is harder to breathe than usual.  Your breathing is faster than usual.  You have trouble sleeping.  You need to use your medicines more often than usual.  You have trouble doing your normal activities such as getting  dressed or walking around the house. Get help right away if:  You have shortness of breath while resting.  You have shortness of breath that stops you from: ? Being able to talk. ? Doing normal activities.  Your chest hurts for longer than 5 minutes.  Your skin color is more blue than usual.  Your pulse oximeter shows that you have low oxygen for longer than 5 minutes.  You have a fever.  You feel too tired to breathe normally. Summary  Chronic obstructive pulmonary disease (COPD) is a long-term lung  problem.  The way your lungs work will never return to normal. Usually the condition gets worse over time. There are things you can do to keep yourself as healthy as possible.  Take over-the-counter and prescription medicines only as told by your doctor.  If you smoke, stop. Smoking makes the problem worse. This information is not intended to replace advice given to you by your health care provider. Make sure you discuss any questions you have with your health care provider. Document Revised: 06/01/2017 Document Reviewed: 07/24/2016 Elsevier Patient Education  2020 [employer].    COPD and Physical Activity Chronic obstructive pulmonary disease (COPD) is a long-term (chronic) condition that affects the lungs. COPD is a general term that can be used to describe many different lung problems that cause lung swelling (inflammation) and limit airflow, including chronic bronchitis and emphysema. The main symptom of COPD is shortness of breath, which makes it harder to do even simple tasks. This can also make it harder to exercise and be active. Talk with your health care provider about treatments to help you breathe better and actions you can take to prevent breathing problems during physical activity. What are the benefits of exercising with COPD? Exercising regularly is an important part of a healthy lifestyle. You can still exercise and do physical activities even though you have COPD. Exercise and physical activity improve your shortness of breath by increasing blood flow (circulation). This causes your heart to pump more oxygen through your body. Moderate exercise can improve your: Oxygen use. Energy level. Shortness of breath. Strength in your breathing muscles. Heart health. Sleep. Self-esteem and feelings of self-worth. Depression, stress, and anxiety levels. Exercise can benefit everyone with COPD. The severity of your disease may affect how hard you can exercise, especially at first,  but everyone can benefit. Talk with your health care provider about how much exercise is safe for you, and which activities and exercises are safe for you. What actions can I take to prevent breathing problems during physical activity? Sign up for a pulmonary rehabilitation program. This type of program may include: Education about lung diseases. Exercise classes that teach you how to exercise and be more active while improving your breathing. This usually involves: Exercise using your lower extremities, such as a stationary bicycle. About 30 minutes of exercise, 2 to 5 times per week, for 6 to 12 weeks Strength training, such as push ups or leg lifts. Nutrition education. Group classes in which you can talk with others who also have COPD and learn ways to manage stress. If you use an oxygen tank, you should use it while you exercise. Work with your health care provider to adjust your oxygen for your physical activity. Your resting flow rate is different from your flow rate during physical activity. While you are exercising: Take slow breaths. Pace yourself and do not try to go too fast. Purse your lips while breathing out. Pursing your lips  is similar to a kissing or whistling position. If doing exercise that uses a quick burst of effort, such as weight lifting: Breathe in before starting the exercise. Breathe out during the hardest part of the exercise (such as raising the weights). Where to find support You can find support for exercising with COPD from: Your health care provider. A pulmonary rehabilitation program. Your local health department or community health programs. Support groups, online or in-person. Your health care provider may be able to recommend support groups. Where to find more information You can find more information about exercising with COPD from: American Lung Association: [place].hu. COPD Foundation: AlmostHot.gl. Contact a health care provider if: Your  symptoms get worse. You have chest pain. You have nausea. You have a fever. You have trouble talking or catching your breath. You want to start a new exercise program or a new activity. Summary COPD is a general term that can be used to describe many different lung problems that cause lung swelling (inflammation) and limit airflow. This includes chronic bronchitis and emphysema. Exercise and physical activity improve your shortness of breath by increasing blood flow (circulation). This causes your heart to provide more oxygen to your body. Contact your health care provider before starting any exercise program or new activity. Ask your health care provider what exercises and activities are safe for you. This information is not intended to replace advice given to you by your health care provider. Make sure you discuss any questions you have with your health care provider. Document Revised: 10/09/2018 Document Reviewed: 07/12/2017 Elsevier Patient Education  2020 [employer].  COVID-19 Vaccines and How They Work How do vaccines work? Vaccines help people develop immunity to a virus or other germ. A vaccine introduces a less harmful part of that germ -- or something created to look or behave like it -- into a person's body. The body's immune system develops antibodies that fight that particular germ and keep the person from getting sick from it. Later, if the person encounters that germ again, their immune system can "recognize" it and "remember" how to fight it off.  Is there a vaccine for the coronavirus disease? Several COVID-19 vaccines have been authorized for emergency use among specific age groups by the U.S. Food and Drug Administration (FDA). [facility] views all authorized COVID-19 vaccines as highly effective at preventing serious disease, hospitalization, and death from COVID-19.  Learn more about coronavirus vaccine safety and what you need to know about the COVID  vaccines.  How will a vaccine prevent COVID-19? The coronavirus that causes COVID-19 has spikes of protein on each viral particle. These spikes help the viruses attach to cells and cause disease. Some of the coronavirus vaccines in development are designed to help the body "recognize" these spike proteins and fight the coronavirus that has them.  An effective vaccine will protect someone who receives it by lowering the chance of getting COVID-19 if the person encounters the coronavirus. More important is whether the vaccine prevents serious illness, hospitalization and death.  At this time, all three vaccines are highly efficacious at preventing serious illness, hospitalization and death from COVID-19.  Widespread vaccination means the coronavirus will not infect as many people. This will limit spread through communities and will restrict the virus's opportunity to continue to mutate into new variants.  When can I get the coronavirus vaccine? Now that the Food and Drug Administration has issued emergency use authorizations for COVID-19 vaccines, vaccines are being distributed across the [place]  States.  If you are a [facility] patient, visit our COVID-19 Vaccine Information and Updates page for current information on getting vaccinated. Your state's health department website can also provide updates on vaccine distribution in your area.  You can also look up places like [employer] and [facility] for other information on COVID and the COVID vaccine.

## 2020-03-11 ENCOUNTER — Emergency Department (HOSPITAL_BASED_OUTPATIENT_CLINIC_OR_DEPARTMENT_OTHER)
Admission: EM | Admit: 2020-03-11 | Discharge: 2020-03-11 | Disposition: A | Discharge status: Home / Self Care | Payer: Medicare PPO | Attending: Emergency Medicine | Admitting: Emergency Medicine

## 2020-03-11 ENCOUNTER — Emergency Department (HOSPITAL_BASED_OUTPATIENT_CLINIC_OR_DEPARTMENT_OTHER): Payer: Medicare PPO

## 2020-03-11 ENCOUNTER — Other Ambulatory Visit: Payer: Self-pay

## 2020-03-11 ENCOUNTER — Encounter (HOSPITAL_BASED_OUTPATIENT_CLINIC_OR_DEPARTMENT_OTHER): Payer: Self-pay

## 2020-03-11 DIAGNOSIS — I10 Essential (primary) hypertension: Secondary | ICD-10-CM | POA: Diagnosis not present

## 2020-03-11 DIAGNOSIS — R000 Tachycardia, unspecified: Secondary | ICD-10-CM | POA: Diagnosis not present

## 2020-03-11 DIAGNOSIS — E876 Hypokalemia: Secondary | ICD-10-CM | POA: Diagnosis not present

## 2020-03-11 DIAGNOSIS — U071 COVID-19: Secondary | ICD-10-CM | POA: Diagnosis not present

## 2020-03-11 DIAGNOSIS — Z87891 Personal history of nicotine dependence: Secondary | ICD-10-CM | POA: Diagnosis not present

## 2020-03-11 LAB — COMPREHENSIVE METABOLIC PANEL
ALT: 17 U/L (ref 0–44)
AST: 22 U/L (ref 15–41)
Albumin: 4.1 g/dL (ref 3.5–5.0)
Alkaline Phosphatase: 80 U/L (ref 38–126)
Anion gap: 12 (ref 5–15)
BUN: 16 mg/dL (ref 8–23)
CO2: 22 mmol/L (ref 22–32)
Calcium: 9.7 mg/dL (ref 8.9–10.3)
Chloride: 101 mmol/L (ref 98–111)
Creatinine, Ser: 1.18 mg/dL — ABNORMAL HIGH (ref 0.44–1.00)
GFR calc Af Amer: 56 mL/min — ABNORMAL LOW (ref >60)
GFR calc non Af Amer: 48 mL/min — ABNORMAL LOW (ref >60)
Glucose, Bld: 116 mg/dL — ABNORMAL HIGH (ref 70–99)
Potassium: 2.9 mmol/L — ABNORMAL LOW (ref 3.5–5.1)
Sodium: 135 mmol/L (ref 135–145)
Total Bilirubin: 0.5 mg/dL (ref 0.3–1.2)
Total Protein: 7.6 g/dL (ref 6.5–8.1)

## 2020-03-11 LAB — CBC
HCT: 42.9 % (ref 36.0–46.0)
Hemoglobin: 14.2 g/dL (ref 12.0–15.0)
MCH: 28.8 pg (ref 26.0–34.0)
MCHC: 33.1 g/dL (ref 30.0–36.0)
MCV: 87 fL (ref 80.0–100.0)
Platelets: 366 10*3/uL (ref 150–400)
RBC: 4.93 MIL/uL (ref 3.87–5.11)
RDW: 12.4 % (ref 11.5–15.5)
WBC: 8.9 10*3/uL (ref 4.0–10.5)
nRBC: 0 % (ref 0.0–0.2)

## 2020-03-11 LAB — D-DIMER, QUANTITATIVE: D-Dimer, Quant: <0.27 ug/mL-FEU (ref 0.00–0.50)

## 2020-03-11 LAB — TROPONIN I (HIGH SENSITIVITY): Troponin I (High Sensitivity): 4 ng/L (ref <18)

## 2020-03-11 MED ORDER — POTASSIUM CHLORIDE CRYS ER 20 MEQ PO TBCR
40.0000 meq | EXTENDED_RELEASE_TABLET | Freq: Once | ORAL | Status: AC
  Administered 2020-03-11: 40 meq via ORAL
  Filled 2020-03-11: qty 2

## 2020-03-11 MED ORDER — SODIUM CHLORIDE 0.9 % IV BOLUS
1000.0000 mL | Freq: Once | INTRAVENOUS | Status: AC
  Administered 2020-03-11: 1000 mL via INTRAVENOUS

## 2020-03-11 NOTE — ED Provider Notes (Signed)
[facility] Emergency Department Provider Note MRN:  [id number]  Arrival date & time: 03/11/20     Chief Complaint   Tachycardia (+COVID)   History of Present Illness   Lindsay Moore is a 64 y.o. year-old female with no pertinent past medical history presenting to the ED with chief complaint of tachycardia.  Patient explains that she had COVID-19 early in August and was sick for 2 weeks.  She began feeling well but then 1 week ago experienced sudden onset shortness of breath that lasted several days.  She was seen by a doctornd they suspected COPD and gave her an inhaler.  Shortness of breath seems to have improved, but today she is feeling generally unwell, tired, lightheaded and noted that her heart rate was persistently elevated, in the 130s.  She denies chest pain, no headache or vision change, no numbness or weakness to the arms or legs, no abdominal pain.  No drugs or alcohol, no vomiting but did have some diarrhea last night.  Review of Systems  A complete 10 system review of systems was obtained and all systems are negative except as noted in the HPI and PMH.   Patient's Health History    Past Medical History:  Diagnosis Date  . AC (acromioclavicular) joint bone spurs   . Essential hypertension, benign 06/10/2013  . 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 Hyperlipidemia 06/10/2013  . 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 Right knee pain 06/10/2013   Workers Comp: [facility] Dr. Luiz Blare     Past Surgical History:  Procedure Laterality Date  . bicep surgery Right   . broken hip  2000  . FOOT TENDON SURGERY  2000  . GALLBLADDER SURGERY    . orthoscopic knee surgery  10/2012  . TONSILLECTOMY  1959  . TUBAL LIGATION  1988    Family History  Problem Relation Age of Onset  . Alcoholism Father   . Lung cancer Father   . Diabetes Other        grandmother  . Hyperlipidemia Mother   . Hypertension Mother   . Stroke Other        grandmother      Social History   Socioeconomic History  . Marital status: Married    Spouse name: Not on file  . Number of children: Not on file  . Years of education: Not on file  . Highest education level: Not on file  Occupational History  . Occupation: Runner, broadcasting/film/video  Tobacco Use  . Smoking status: Former Smoker    Packs/day: 1.00    Years: 25.00    Pack years: 25.00    Types: Cigarettes    Quit date: 07/03/1997    Years since quitting: 22.7  . Smokeless tobacco: Never Used  Vaping Use  . Vaping Use: Never used  Substance and Sexual Activity  . Alcohol use: Not Currently  . Drug use: No  . Sexual activity: Not on file  Other Topics Concern  . Not on file  Social History Narrative  . Not on file   Social Determinants of Health   Financial Resource Strain:   . Difficulty of Paying Living Expenses: Not on file  Food Insecurity:   . Worried About Programme researcher, broadcasting/film/video in the Last Year: Not on file  . Ran Out of Food in the Last Year: Not on file  Transportation Needs:   . Lack of Transportation (Medical): Not on file  . Lack of  Transportation (Non-Medical): Not on file  Physical Activity:   . Days of Exercise per Week: Not on file  . Minutes of Exercise per Session: Not on file  Stress:   . Feeling of Stress : Not on file  Social Connections:   . Frequency of Communication with Friends and Family: Not on file  . Frequency of Social Gatherings with Friends and Family: Not on file  . Attends Religious Services: Not on file  . Active Member of Clubs or Organizations: Not on file  . Attends Banker Meetings: Not on file  . Marital Status: Not on file  Intimate Partner Violence:   . Fear of Current or Ex-Partner: Not on file  . Emotionally Abused: Not on file  . Physically Abused: Not on file  . Sexually Abused: Not on file     Physical Exam   Vitals:   03/11/20 1425 03/11/20 1537  BP: (!) 178/95 (!) 177/88  Pulse: (!) 131 92  Resp: 20 14  Temp: 98.6 F (37 C)    SpO2: 97% 98%    CONSTITUTIONAL: Well-appearing, NAD NEURO:  Alert and oriented x 3, no focal deficits EYES:  eyes equal and reactive ENT/NECK:  no LAD, no JVD CARDIO: Tachycardic rate, well-perfused, normal S1 and S2 PULM:  CTAB no wheezing or rhonchi GI/GU:  normal bowel sounds, non-distended, non-tender MSK/SPINE:  No gross deformities, no edema SKIN:  no rash, atraumatic PSYCH:  Appropriate speech and behavior  *Additional and/or pertinent findings included in MDM below  Diagnostic and Interventional Summary    EKG Interpretation  Date/Time:  Thursday March 11 2020 14:23:34 EDT Ventricular Rate:  129 PR Interval:  148 QRS Duration: 74 QT Interval:  310 QTC Calculation: 454 R Axis:   81 Text Interpretation: Sinus tachycardia Right atrial enlargement Nonspecific ST abnormality Abnormal ECG Confirmed by Kennis Carina [id number]) on 03/11/2020 3:01:37 PM      Labs Reviewed  COMPREHENSIVE METABOLIC PANEL - Abnormal; Notable for the following components:      Result Value   Potassium 2.9 (*)    Glucose, Bld 116 (*)    Creatinine, Ser 1.18 (*)    GFR calc non Af Amer 48 (*)    GFR calc Af Amer 56 (*)    All other components within normal limits  CBC  D-DIMER, QUANTITATIVE (NOT AT [facility])  TROPONIN I (HIGH SENSITIVITY)    DG Chest Port 1 View  Final Result      Medications  potassium chloride SA (KLOR-CON) CR tablet 40 mEq (has no administration in time range)  sodium chloride 0.9 % bolus 1,000 mL (1,000 mLs Intravenous New Bag/Given 03/11/20 1535)     Procedures  /  Critical Care Procedures  ED Course and Medical Decision Making  I have reviewed the triage vital signs, the nursing notes, and pertinent available records from the EMR.  Listed above are laboratory and imaging tests that I personally ordered, reviewed, and interpreted and then considered in my medical decision making (see below for details).  Suspect patient's tachycardia and mild symptoms today are  related to dehydration with a component of anxiety.  However given her recent COVID-19 illness and her recent shortness of breath, with her tachycardia today there is concern for pulmonary embolism.  We will still consider this to be low risk given her well-appearing nature and minimal symptoms at this time, screening with D-dimer.     D-dimer is negative, troponin is negative, tachycardia resolved, appropriate for discharge.  Casimiro Needle  Randol Kern, MD [facility] Emergency Medicine [facility] [email or web address]  Final Clinical Impressions(s) / ED Diagnoses     ICD-10-CM   1. Hypokalemia  E87.6   2. Tachycardia  R00.0     ED Discharge Orders    None       Discharge Instructions Discussed with and Provided to Patient:     Discharge Instructions     You were evaluated in the Emergency Department and after careful evaluation, we did not find any emergent condition requiring admission or further testing in the hospital.  Your exam/testing today was overall reassuring.  Your symptoms seem to be due to mild dehydration as well as low potassium levels.  Anxiety or stress may also be contributing.  Please follow-up with your regular doctor and try to hydrate at home and incorporate more potassium rich foods in your diet.  Please return to the Emergency Department if you experience any worsening of your condition.  Thank you for allowing [place] to be a part of your care.        Sabas Sous, MD 03/11/20 [phone]

## 2020-03-11 NOTE — ED Triage Notes (Addendum)
Pt c/o fast heart rate started 10am-states she was dx with covid 8/27-denies pain/fever-states she felt anxious and took hydroxyzine 25mg  ~10am-NAD-steady gait

## 2020-03-11 NOTE — Discharge Instructions (Addendum)
You were evaluated in the Emergency Department and after careful evaluation, we did not find any emergent condition requiring admission or further testing in the hospital.  Your exam/testing today was overall reassuring.  Your symptoms seem to be due to mild dehydration as well as low potassium levels.  Anxiety or stress may also be contributing.  Please follow-up with your regular doctor and try to hydrate at home and incorporate more potassium rich foods in your diet.  Please return to the Emergency Department if you experience any worsening of your condition.  Thank you for allowing [place] to be a part of your care.

## 2020-03-13 ENCOUNTER — Encounter (HOSPITAL_BASED_OUTPATIENT_CLINIC_OR_DEPARTMENT_OTHER): Payer: Self-pay | Admitting: Emergency Medicine

## 2020-03-13 ENCOUNTER — Emergency Department (HOSPITAL_BASED_OUTPATIENT_CLINIC_OR_DEPARTMENT_OTHER)
Admission: EM | Admit: 2020-03-13 | Discharge: 2020-03-13 | Disposition: A | Discharge status: Home / Self Care | Payer: Medicare PPO | Attending: Emergency Medicine | Admitting: Emergency Medicine

## 2020-03-13 ENCOUNTER — Other Ambulatory Visit: Payer: Self-pay

## 2020-03-13 ENCOUNTER — Emergency Department (HOSPITAL_BASED_OUTPATIENT_CLINIC_OR_DEPARTMENT_OTHER): Payer: Medicare PPO

## 2020-03-13 DIAGNOSIS — R918 Other nonspecific abnormal finding of lung field: Secondary | ICD-10-CM | POA: Diagnosis not present

## 2020-03-13 DIAGNOSIS — R911 Solitary pulmonary nodule: Secondary | ICD-10-CM | POA: Diagnosis not present

## 2020-03-13 DIAGNOSIS — U071 COVID-19: Secondary | ICD-10-CM | POA: Diagnosis not present

## 2020-03-13 DIAGNOSIS — J439 Emphysema, unspecified: Secondary | ICD-10-CM | POA: Diagnosis not present

## 2020-03-13 DIAGNOSIS — R0602 Shortness of breath: Secondary | ICD-10-CM | POA: Insufficient documentation

## 2020-03-13 DIAGNOSIS — R002 Palpitations: Secondary | ICD-10-CM | POA: Diagnosis not present

## 2020-03-13 DIAGNOSIS — Z87891 Personal history of nicotine dependence: Secondary | ICD-10-CM | POA: Insufficient documentation

## 2020-03-13 DIAGNOSIS — R000 Tachycardia, unspecified: Secondary | ICD-10-CM | POA: Diagnosis not present

## 2020-03-13 DIAGNOSIS — Z7982 Long term (current) use of aspirin: Secondary | ICD-10-CM | POA: Insufficient documentation

## 2020-03-13 DIAGNOSIS — Z79899 Other long term (current) drug therapy: Secondary | ICD-10-CM | POA: Diagnosis not present

## 2020-03-13 DIAGNOSIS — J449 Chronic obstructive pulmonary disease, unspecified: Secondary | ICD-10-CM | POA: Diagnosis not present

## 2020-03-13 DIAGNOSIS — Z9049 Acquired absence of other specified parts of digestive tract: Secondary | ICD-10-CM | POA: Diagnosis not present

## 2020-03-13 DIAGNOSIS — I10 Essential (primary) hypertension: Secondary | ICD-10-CM | POA: Insufficient documentation

## 2020-03-13 LAB — HEPATIC FUNCTION PANEL
ALT: 23 U/L (ref 0–44)
AST: 29 U/L (ref 15–41)
Albumin: 4.5 g/dL (ref 3.5–5.0)
Alkaline Phosphatase: 80 U/L (ref 38–126)
Bilirubin, Direct: <0.1 mg/dL (ref 0.0–0.2)
Total Bilirubin: 0.4 mg/dL (ref 0.3–1.2)
Total Protein: 8.2 g/dL — ABNORMAL HIGH (ref 6.5–8.1)

## 2020-03-13 LAB — BASIC METABOLIC PANEL
Anion gap: 13 (ref 5–15)
BUN: 16 mg/dL (ref 8–23)
CO2: 22 mmol/L (ref 22–32)
Calcium: 9.7 mg/dL (ref 8.9–10.3)
Chloride: 100 mmol/L (ref 98–111)
Creatinine, Ser: 1.3 mg/dL — ABNORMAL HIGH (ref 0.44–1.00)
GFR calc Af Amer: 50 mL/min — ABNORMAL LOW (ref >60)
GFR calc non Af Amer: 43 mL/min — ABNORMAL LOW (ref >60)
Glucose, Bld: 141 mg/dL — ABNORMAL HIGH (ref 70–99)
Potassium: 3.5 mmol/L (ref 3.5–5.1)
Sodium: 135 mmol/L (ref 135–145)

## 2020-03-13 LAB — PROCALCITONIN: Procalcitonin: <0.1 ng/mL

## 2020-03-13 LAB — CBC
HCT: 47.5 % — ABNORMAL HIGH (ref 36.0–46.0)
Hemoglobin: 15.5 g/dL — ABNORMAL HIGH (ref 12.0–15.0)
MCH: 28.9 pg (ref 26.0–34.0)
MCHC: 32.6 g/dL (ref 30.0–36.0)
MCV: 88.5 fL (ref 80.0–100.0)
Platelets: 379 10*3/uL (ref 150–400)
RBC: 5.37 MIL/uL — ABNORMAL HIGH (ref 3.87–5.11)
RDW: 12.6 % (ref 11.5–15.5)
WBC: 9.3 10*3/uL (ref 4.0–10.5)
nRBC: 0 % (ref 0.0–0.2)

## 2020-03-13 LAB — LACTIC ACID, PLASMA: Lactic Acid, Venous: 2 mmol/L (ref 0.5–1.9)

## 2020-03-13 LAB — LACTATE DEHYDROGENASE: LDH: 145 U/L (ref 98–192)

## 2020-03-13 LAB — URINALYSIS, MICROSCOPIC (REFLEX)

## 2020-03-13 LAB — URINALYSIS, ROUTINE W REFLEX MICROSCOPIC
Bilirubin Urine: NEGATIVE
Glucose, UA: NEGATIVE mg/dL
Ketones, ur: NEGATIVE mg/dL
Leukocytes,Ua: NEGATIVE
Nitrite: NEGATIVE
Protein, ur: NEGATIVE mg/dL
Specific Gravity, Urine: <1.005 — ABNORMAL LOW (ref 1.005–1.030)
pH: 6 (ref 5.0–8.0)

## 2020-03-13 LAB — TROPONIN I (HIGH SENSITIVITY)
Troponin I (High Sensitivity): 3 ng/L (ref <18)
Troponin I (High Sensitivity): 4 ng/L (ref <18)

## 2020-03-13 LAB — TSH: TSH: 0.886 u[IU]/mL (ref 0.350–4.500)

## 2020-03-13 LAB — FERRITIN: Ferritin: 177 ng/mL (ref 11–307)

## 2020-03-13 LAB — D-DIMER, QUANTITATIVE: D-Dimer, Quant: 0.29 ug/mL-FEU (ref 0.00–0.50)

## 2020-03-13 LAB — C-REACTIVE PROTEIN: CRP: 0.8 mg/dL (ref <1.0)

## 2020-03-13 LAB — FIBRINOGEN: Fibrinogen: 413 mg/dL (ref 210–475)

## 2020-03-13 LAB — TRIGLYCERIDES: Triglycerides: 190 mg/dL — ABNORMAL HIGH (ref <150)

## 2020-03-13 MED ORDER — METOPROLOL SUCCINATE ER 25 MG PO TB24: 25.0000 mg | ORAL_TABLET | Freq: Every day | ORAL | 0 refills | Status: DC | PRN

## 2020-03-13 MED ORDER — SODIUM CHLORIDE 0.9 % IV BOLUS
1000.0000 mL | Freq: Once | INTRAVENOUS | Status: AC
  Administered 2020-03-13: 1000 mL via INTRAVENOUS

## 2020-03-13 MED ORDER — IOHEXOL 350 MG/ML SOLN
100.0000 mL | Freq: Once | INTRAVENOUS | Status: AC | PRN
  Administered 2020-03-13: 80 mL via INTRAVENOUS

## 2020-03-13 NOTE — ED Triage Notes (Signed)
Was here with tachycardia 2 days ago. Dx with hypokalemia. Reports the tachycardia returned today. Denies chest pain.

## 2020-03-13 NOTE — ED Notes (Signed)
Date and time results received: 03/13/20 1618   Test: lactic acid Critical Value: 2.0  Name of Provider Notified: Silverio Lay  Orders Received? Or Actions Taken?: no orders given

## 2020-03-13 NOTE — ED Provider Notes (Signed)
[facility] EMERGENCY DEPARTMENT Provider Note   CSN: [id number] Arrival date & time: 03/13/20  1210     History Chief Complaint  Patient presents with  . Tachycardia    COVID+ 8/26    Lindsay Moore is a 64 y.o. female hx of HTN, HL, here presenting with tachycardia.  Patient was suspected to have Covid about early August.  Patient was seen here August 27 and tested positive for Covid again.  She states that she has been having palpitations for the last 2 to 3 days.  She came to the ED 2 days ago and had a negative D-dimer as well as blood test that showed potassium of 2.9.  She states that she has been hydrated and has been taking her potassium supplements as prescribed.  However she states that her heart rate still goes in the 120s even at rest.  She has some subjective shortness of breath and just feels very anxious. She denies any further COVID exposures recently   The history is provided by the patient.       Past Medical History:  Diagnosis Date  . AC (acromioclavicular) joint bone spurs   . Essential hypertension, benign 06/10/2013  . 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 Hyperlipidemia 06/10/2013  . 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 Right knee pain 06/10/2013   Workers Comp: [facility] Dr. Luiz Blare     Patient Active Problem List   Diagnosis Date Noted  . Chronic obstructive pulmonary disease with acute exacerbation (HCC) 03/04/2020  . Health education 03/04/2020  . Closed displaced fracture of proximal phalanx of third toe of right foot 12/30/2019  . Flexural eczema 11/23/2016  . Primary osteoarthritis of both knees 08/31/2016  . Biceps tendinitis on right 06/22/2015  . Serous otitis media 01/06/2015  . Postmenopausal bleeding 08/17/2014  . History of abnormal cervical Pap smear 06/19/2014  . Lumbar strain 01/06/2014  . Breast mass 07/11/2013  . History of colonic polyps 06/19/2013  . TIA (transient ischemic attack) 06/10/2013  . Essential  hypertension, benign 06/10/2013  . Septal defect 06/10/2013  . 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 Situational anxiety 06/10/2013  . Hyperlipidemia 06/10/2013    Past Surgical History:  Procedure Laterality Date  . bicep surgery Right   . broken hip  2000  . FOOT TENDON SURGERY  2000  . GALLBLADDER SURGERY    . orthoscopic knee surgery  10/2012  . TONSILLECTOMY  1959  . TUBAL LIGATION  1988     OB History    Gravida  6   Para  5   Term      Preterm  2   AB      Living  5     SAB      TAB      Ectopic      Multiple      Live Births              Family History  Problem Relation Age of Onset  . Alcoholism Father   . Lung cancer Father   . Diabetes Other        grandmother  . Hyperlipidemia Mother   . Hypertension Mother   . Stroke Other        grandmother    Social History   Tobacco Use  . Smoking status: Former Smoker    Packs/day: 1.00    Years: 25.00    Pack years: 25.00    Types: Cigarettes  Quit date: 07/03/1997    Years since quitting: 22.7  . Smokeless tobacco: Never Used  Vaping Use  . Vaping Use: Never used  Substance Use Topics  . Alcohol use: Not Currently  . Drug use: No    Home Medications Prior to Admission medications   Medication Sig Start Date End Date Taking? Authorizing Provider  acetaminophen (TYLENOL) 650 MG CR tablet Take 1 tablet (650 mg total) by mouth every 8 (eight) hours as needed for pain. 12/07/16   Monica Becton, MD  albuterol (VENTOLIN HFA) 108 (90 Base) MCG/ACT inhaler Inhale 1-2 puffs into the lungs every 6 (six) hours as needed for wheezing or shortness of breath. 02/27/20   Lurene Shadow, PA-C  aspirin EC 81 MG tablet Take 1 tablet (81 mg total) by mouth daily. 06/10/13   Laren Boom, DO  Calcium Carb-Cholecalciferol (CALCIUM + D3 PO) Take by mouth.    [provider]  hydrOXYzine (ATARAX/VISTARIL) 25 MG tablet Take 1 tablet (25 mg total) by mouth every 8 (eight) hours as needed for anxiety.  02/27/20   Phelps, Erin O, PA-C  MEGARED OMEGA-3 KRILL OIL PO Take by mouth.    [provider]  Multiple Vitamin (MULTIVITAMIN) tablet Take 1 tablet by mouth daily.    [provider]  potassium chloride (KLOR-CON) 10 MEQ tablet TAKE 1 TABLET BY MOUTH ONCE DAILY 11/24/19   Sunnie Nielsen, DO  triamcinolone ointment (KENALOG) 0.1 % Apply 1 application topically 2 (two) times daily. To affected areas 05/26/19   Sunnie Nielsen, DO  triamterene-hydrochlorothiazide (MAXZIDE-25) 37.5-25 MG tablet Take 1 tablet by mouth daily. 11/24/19   Sunnie Nielsen, DO    Allergies    Amlodipine, Bee venom, Ibuprofen, and Lisinopril  Review of Systems   Review of Systems  Respiratory: Positive for shortness of breath.   Cardiovascular: Positive for palpitations.  All other systems reviewed and are negative.   Physical Exam Updated Vital Signs BP (!) 155/90 (BP Location: Right Arm)   Pulse (!) 119   Temp 98.3 F (36.8 C) (Oral)   Resp 20   Ht 5\' 4"  (1.626 m)   Wt 67.1 kg   SpO2 98%   BMI 25.40 kg/m   Physical Exam Vitals and nursing note reviewed.  Constitutional:      Appearance: Normal appearance.  HENT:     Head: Normocephalic.     Nose: Nose normal.     Mouth/Throat:     Mouth: Mucous membranes are moist.  Eyes:     Extraocular Movements: Extraocular movements intact.     Pupils: Pupils are equal, round, and reactive to light.  Cardiovascular:     Rate and Rhythm: Regular rhythm. Tachycardia present.     Pulses: Normal pulses.     Heart sounds: Normal heart sounds.  Pulmonary:     Effort: Pulmonary effort is normal.     Breath sounds: Normal breath sounds.     Comments: Dry crackles bilateral bases, no retractions  Abdominal:     General: Abdomen is flat.     Palpations: Abdomen is soft.  Musculoskeletal:        General: Normal range of motion.     Cervical back: Normal range of motion and neck supple.  Skin:    General: Skin is warm.      Capillary Refill: Capillary refill takes less than 2 seconds.  Neurological:     General: No focal deficit present.     Mental Status: She is alert.  Psychiatric:        Mood and Affect: Mood normal.        Behavior: Behavior normal.     ED Results / Procedures / Treatments   Labs (all labs ordered are listed, but only abnormal results are displayed) Labs Reviewed  BASIC METABOLIC PANEL - Abnormal; Notable for the following components:      Result Value   Glucose, Bld 141 (*)    Creatinine, Ser 1.30 (*)    GFR calc non Af Amer 43 (*)    GFR calc Af Amer 50 (*)    All other components within normal limits  CBC - Abnormal; Notable for the following components:   RBC 5.37 (*)    Hemoglobin 15.5 (*)    HCT 47.5 (*)    All other components within normal limits  CULTURE, BLOOD (ROUTINE X 2)  CULTURE, BLOOD (ROUTINE X 2)  LACTIC ACID, PLASMA  LACTIC ACID, PLASMA  D-DIMER, QUANTITATIVE (NOT AT [facility])  PROCALCITONIN  LACTATE DEHYDROGENASE  FERRITIN  TRIGLYCERIDES  FIBRINOGEN  C-REACTIVE PROTEIN  TSH  TROPONIN I (HIGH SENSITIVITY)  TROPONIN I (HIGH SENSITIVITY)    EKG EKG Interpretation  Date/Time:  Saturday March 13 2020 12:21:46 EDT Ventricular Rate:  116 PR Interval:  138 QRS Duration: 70 QT Interval:  318 QTC Calculation: 442 R Axis:   82 Text Interpretation: Sinus tachycardia Nonspecific ST and T wave abnormality Abnormal ECG Confirmed by Raeford Razor [phone]) on 03/13/2020 12:49:57 PM   Radiology No results found.  Procedures Procedures (including critical care time)  Medications Ordered in ED Medications  sodium chloride 0.9 % bolus 1,000 mL (has no administration in time range)  iohexol (OMNIPAQUE) 350 MG/ML injection 100 mL (has no administration in time range)    ED Course  I have reviewed the triage vital signs and the nursing notes.  Pertinent labs & imaging results that were available during my care of the patient were reviewed by me and  considered in my medical decision making (see chart for details).    MDM Rules/Calculators/A&P                         Lindsay Moore is a 64 y.o. female here with palpitations. Had COVID infection around a month ago. Palpitations are new since previous. D-dimer negative 2 days ago. Will get inflammatory markers and TSH. WIll get CTA to further assess   6:58 PM Patient's inflammatory markers are not elevated except lactate of 2.  Patient has no UTI or pneumonia.  Her CTA did not show any PE. She does have incidental lung nodule.  Her potassium is normal today her creatinine went up to 1.3 from 1.1. Patient was given IV fluids and heart rate went down to the 90s.  Her TSH is also normal.  At this point I do not know why she is tachycardic occasionally.  I wonder if it is a post Covid syndrome.  We will give metoprolol as needed for tachycardia and have her follow-up with cardiology.   Final Clinical Impression(s) / ED Diagnoses Final diagnoses:  None    Rx / DC Orders ED Discharge Orders    None       Charlynne Pander, MD 03/13/20 1900

## 2020-03-13 NOTE — Discharge Instructions (Signed)
Your elevated heart rate can be a post Covid syndrome.  Please stay hydrated.  You can take metoprolol daily if your heart rate is above 110  See your doctor for follow-up.  Consider following up with cardiologist.  You have an incidental lung nodule that can be followed up outpatient  Return to ER if you have persistent tachycardia around 120s despite the medicine, chest pain, trouble breathing, fever.

## 2020-03-15 NOTE — Progress Notes (Signed)
Subjective:    CC: hospital discharge follow up  HPI: Pleasant 64 year old accompanied by her husband presenting today for hospital follow-up.  Since 8/27 she has been seen for 2 urgent care visits and 1 emergency room visit revolving around anxiety, tachycardia, and shortness of breath.  Patient reports that she has been very anxious lately especially since having Covid on 8/8.  She has had a full resolution of most of her symptoms but notes her anxiety has been much higher than usual.  Her heart rate has been elevated lately and it is suspected that this may be related to a post Covid syndrome.  At the emergency room, she was given Toprol XL to use as needed for tachycardia.  She has not started this medication.  She was also given hydroxyzine for anxiety.  She endorses taking a dose of hydroxyzine but feels that it left her with a bit of brain fog.  She admits to not liking to take medications and being very nervous to try new ones.  Notes that she has been a very anxious type of person her whole life but it has never been this bad.  Has not seen a cardiologist recently but was advised by the emergency room provider that this may be a good idea. Would like to have her blood work rechecked as her potassium was low during her second urgent care visit.  Has lots of questions about COVID-19, expected resolution of symptoms, and the COVID-19 vaccines.  I reviewed the past medical history, family history, social history, surgical history, and allergies today and no changes were needed.  Please see the problem list section below in epic for further details.  Past Medical History: Past Medical History:  Diagnosis Date  . AC (acromioclavicular) joint bone spurs   . Essential hypertension, benign 06/10/2013  . 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 Hyperlipidemia 06/10/2013  . 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 Right knee pain 06/10/2013   Workers Comp: [facility] Dr. Luiz Blare    Past Surgical  History: Past Surgical History:  Procedure Laterality Date  . bicep surgery Right   . broken hip  2000  . FOOT TENDON SURGERY  2000  . GALLBLADDER SURGERY    . orthoscopic knee surgery  10/2012  . TONSILLECTOMY  1959  . TUBAL LIGATION  1988   Social History: Social History   Socioeconomic History  . Marital status: Married    Spouse name: Not on file  . Number of children: Not on file  . Years of education: Not on file  . Highest education level: Not on file  Occupational History  . Occupation: Runner, broadcasting/film/video  Tobacco Use  . Smoking status: Former Smoker    Packs/day: 1.00    Years: 25.00    Pack years: 25.00    Types: Cigarettes    Quit date: 07/03/1997    Years since quitting: 22.7  . Smokeless tobacco: Never Used  Vaping Use  . Vaping Use: Never used  Substance and Sexual Activity  . Alcohol use: Not Currently  . Drug use: No  . Sexual activity: Not on file  Other Topics Concern  . Not on file  Social History Narrative  . Not on file   Social Determinants of Health   Financial Resource Strain:   . Difficulty of Paying Living Expenses: Not on file  Food Insecurity:   . Worried About Programme researcher, broadcasting/film/video in the Last Year: Not on file  . Ran Out  of Food in the Last Year: Not on file  Transportation Needs:   . Lack of Transportation (Medical): Not on file  . Lack of Transportation (Non-Medical): Not on file  Physical Activity:   . Days of Exercise per Week: Not on file  . Minutes of Exercise per Session: Not on file  Stress:   . Feeling of Stress : Not on file  Social Connections:   . Frequency of Communication with Friends and Family: Not on file  . Frequency of Social Gatherings with Friends and Family: Not on file  . Attends Religious Services: Not on file  . Active Member of Clubs or Organizations: Not on file  . Attends Banker Meetings: Not on file  . Marital Status: Not on file   Family History: Family History  Problem Relation Age of Onset   . Alcoholism Father   . Lung cancer Father   . Diabetes Other        grandmother  . Hyperlipidemia Mother   . Hypertension Mother   . Stroke Other        grandmother   Allergies: Allergies  Allergen Reactions  . Amlodipine     edema  . Bee Venom   . Ibuprofen     REACTION: breathing difficulty  . Lisinopril     tongue swelling   Medications: See med rec.  Review of Systems: See HPI for pertinent positives and negatives.   Objective:    General: Well Developed, well nourished, and in no acute distress.  Neuro: Alert and oriented x3.  HEENT: Normocephalic, atraumatic.  Skin: Warm and dry. Cardiac: Regular rate and rhythm, no murmurs rubs or gallops, no lower extremity edema.  Respiratory: Clear to auscultation bilaterally. Not using accessory muscles, speaking in full sentences.   Impression and Recommendations:    1. Hospital discharge follow-up Reviewed urgent care and emergency room records.  Discussed findings and continued concerns with patient and her husband.  2. Palpitations Elevated heart rate quite possibly related to post Covid syndrome but may also be related to anxiety or another unknown issue.  Referring to cardiology for further evaluation.  Okay to take Toprol-XL as prescribed for elevated heart rate.  Advised patient this is a very small dose of an extended release medication and not likely to cause significant drops in blood pressure or dangerous drops in heart rate.  Rechecking CMP. - Ambulatory referral to Cardiology - COMPLETE METABOLIC PANEL WITH GFR  3. Hypokalemia .  Rechecking CMP. - COMPLETE METABOLIC PANEL WITH GFR  4. Situational mixed anxiety and depressive disorder Okay to use hydroxyzine as needed.  Consider taking one half of a tablet to see if this is enough to calm the anxiety without the brain fog.  Discussed possible maintenance medications.  Patient is hesitant to start new medications but she will consider this and let me know if  something she would like to do.  Not interested in therapy at this time.  5. Advice given about COVID-19 virus infection Discussed COVID-19 infections with known lingering symptoms.  For her and her husband's benefit and reassurance, printed out a table available on up-to-date with this information for them to keep at home.  Discussed the various vaccines that are available and the pros and cons of each.  Recommend he go ahead and get his Covid vaccines as he has not been affected by Covid and never developed symptoms while caring for his wife.  She was advised by emergency room providers to wait  3 months to get her vaccines.  Return in about 4 weeks (around 04/13/2020) for post COVID/anxiety follow up. ___________________________________________ Thayer Ohm, DNP, APRN, FNP-BC Primary Care and Sports Medicine [facility]

## 2020-03-16 ENCOUNTER — Ambulatory Visit (INDEPENDENT_AMBULATORY_CARE_PROVIDER_SITE_OTHER): Payer: Medicare PPO | Admitting: Medical-Surgical

## 2020-03-16 ENCOUNTER — Encounter: Payer: Self-pay | Admitting: Medical-Surgical

## 2020-03-16 VITALS — BP 142/87 | HR 119 | Temp 98.1°F | Ht 64.0 in | Wt 144.7 lb

## 2020-03-16 DIAGNOSIS — Z7189 Other specified counseling: Secondary | ICD-10-CM

## 2020-03-16 DIAGNOSIS — F4323 Adjustment disorder with mixed anxiety and depressed mood: Secondary | ICD-10-CM

## 2020-03-16 DIAGNOSIS — Z09 Encounter for follow-up examination after completed treatment for conditions other than malignant neoplasm: Secondary | ICD-10-CM | POA: Diagnosis not present

## 2020-03-16 DIAGNOSIS — R002 Palpitations: Secondary | ICD-10-CM

## 2020-03-16 DIAGNOSIS — E876 Hypokalemia: Secondary | ICD-10-CM

## 2020-03-17 ENCOUNTER — Ambulatory Visit (INDEPENDENT_AMBULATORY_CARE_PROVIDER_SITE_OTHER): Payer: Medicare PPO | Admitting: Cardiology

## 2020-03-17 ENCOUNTER — Encounter: Payer: Self-pay | Admitting: Cardiology

## 2020-03-17 ENCOUNTER — Other Ambulatory Visit: Payer: Self-pay | Admitting: Medical-Surgical

## 2020-03-17 ENCOUNTER — Other Ambulatory Visit: Payer: Self-pay

## 2020-03-17 VITALS — BP 161/98 | HR 102 | Ht 64.0 in | Wt 145.1 lb

## 2020-03-17 DIAGNOSIS — R002 Palpitations: Secondary | ICD-10-CM | POA: Insufficient documentation

## 2020-03-17 DIAGNOSIS — Q249 Congenital malformation of heart, unspecified: Secondary | ICD-10-CM | POA: Diagnosis not present

## 2020-03-17 DIAGNOSIS — I10 Essential (primary) hypertension: Secondary | ICD-10-CM | POA: Diagnosis not present

## 2020-03-17 DIAGNOSIS — E782 Mixed hyperlipidemia: Secondary | ICD-10-CM

## 2020-03-17 DIAGNOSIS — E876 Hypokalemia: Secondary | ICD-10-CM | POA: Diagnosis not present

## 2020-03-17 HISTORY — DX: Palpitations: R00.2

## 2020-03-17 MED ORDER — METOPROLOL SUCCINATE ER 50 MG PO TB24: 50.0000 mg | ORAL_TABLET | Freq: Every day | ORAL | 3 refills | Status: DC

## 2020-03-17 NOTE — Progress Notes (Signed)
Cardiology Office Note:    Date:  03/17/2020   ID:  [name], [name] [birth year], MRN [id number]  PCP:  Sunnie Nielsen, DO  Cardiologist:  Garwin Brothers, MD   Referring MD: Christen Butter, NP    ASSESSMENT:    1. Essential hypertension, benign   2. Heart defect   3. Mixed hyperlipidemia   4. Palpitations    PLAN:    In order of problems listed above:  1. Primary prevention stressed with the patient.  Importance of compliance with diet medication stressed she vocalized understanding. 2. Essential hypertension: Blood pressure is stable.  She is very anxious today.  I switched her medication from triamterene hydrochlorothiazide to Toprol-XL 50 mg daily.  We will add magnesium to her regimen. 3. Palpitations: I reviewed blood work report.  TSH is fine.  I reassured her.  Hopefully beta-blocker will help. 4. Ex smoker: She promises never to go back to smoking again. 5. Patient will be seen in follow-up appointment in 2 months or earlier if the patient has any concerns   Medication Adjustments/Labs and Tests Ordered: Current medicines are reviewed at length with the patient today.  Concerns regarding medicines are outlined above.  No orders of the defined types were placed in this encounter.  No orders of the defined types were placed in this encounter.    History of Present Illness:    Lindsay Moore is a 64 y.o. female who is being seen today for the evaluation of palpitations and tachycardia at the request of Christen Butter, NP.  Patient is a pleasant 64 year old female.  She has past medical history of essential hypertension.  She is an ex-smoker.  She had Covid infection.  Subsequently she mentions to me that she was tachycardic and feeling palpitations.  She was prescribed beta-blockers but she has not taken it.  No chest pain orthopnea or PND.  At the time of my evaluation, the patient is alert awake oriented and in no distress.  No dizziness or syncope.  At  the time of my evaluation, the patient is alert awake oriented and in no distress.  Past Medical History:  Diagnosis Date  . AC (acromioclavicular) joint bone spurs   . Biceps tendinitis on right 06/22/2015  . Breast mass 07/11/2013   Ultrasound needed January 2018   . Chronic obstructive pulmonary disease with acute exacerbation (HCC) 03/04/2020  . Closed displaced fracture of proximal phalanx of third toe of right foot 12/30/2019  . Essential hypertension, benign 06/10/2013  . Flexural eczema 11/23/2016  . Health education 03/04/2020  . 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 History of abnormal cervical Pap smear 06/19/2014   Cryotherapy was required in the mid 1990s   . History of colonic polyps 06/19/2013   Dr. 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   Marland Kitchen Hyperlipidemia 06/10/2013  . Lumbar strain 01/06/2014  . 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 Postmenopausal bleeding 08/17/2014  . Primary osteoarthritis of both knees 08/31/2016  . Right knee pain 06/10/2013   Workers Comp: [facility] Dr. Luiz Blare   . Septal defect 06/10/2013  . Serous otitis media 01/06/2015  . Situational anxiety 06/10/2013  . TIA (transient ischemic attack) 06/10/2013    Past Surgical History:  Procedure Laterality Date  . bicep surgery Right   . broken hip  2000  . FOOT TENDON SURGERY  2000  . GALLBLADDER SURGERY    . orthoscopic knee surgery  10/2012  . TONSILLECTOMY  1959  . TUBAL LIGATION  1988    Current Medications: Current Meds  Medication Sig  . acetaminophen (TYLENOL) 650 MG CR tablet Take 1 tablet (650 mg total) by mouth every 8 (eight) hours as needed for pain.  Marland Kitchen albuterol (VENTOLIN HFA) 108 (90 Base) MCG/ACT inhaler Inhale 1-2 puffs into the lungs every 6 (six) hours as needed for wheezing or shortness of breath.  Marland Kitchen aspirin EC 81 MG tablet Take 1 tablet (81 mg total) by mouth daily.  . Calcium Carb-Cholecalciferol (CALCIUM + D3 PO) Take by  mouth.  . hydrOXYzine (ATARAX/VISTARIL) 25 MG tablet Take 1 tablet (25 mg total) by mouth every 8 (eight) hours as needed for anxiety.  Marland Kitchen MEGARED OMEGA-3 KRILL OIL PO Take by mouth.  . Multiple Vitamin (MULTIVITAMIN) tablet Take 1 tablet by mouth daily.  . potassium chloride (KLOR-CON) 10 MEQ tablet TAKE 1 TABLET BY MOUTH ONCE DAILY  . triamcinolone ointment (KENALOG) 0.1 % Apply 1 application topically 2 (two) times daily. To affected areas  . triamterene-hydrochlorothiazide (MAXZIDE-25) 37.5-25 MG tablet Take 1 tablet by mouth daily.     Allergies:   Amlodipine, Bee venom, Ibuprofen, and Lisinopril   Social History   Socioeconomic History  . Marital status: Married    Spouse name: Not on file  . Number of children: Not on file  . Years of education: Not on file  . Highest education level: Not on file  Occupational History  . Occupation: Runner, broadcasting/film/video  Tobacco Use  . Smoking status: Former Smoker    Packs/day: 1.00    Years: 25.00    Pack years: 25.00    Types: Cigarettes    Quit date: 07/03/1997    Years since quitting: 22.7  . Smokeless tobacco: Never Used  Vaping Use  . Vaping Use: Never used  Substance and Sexual Activity  . Alcohol use: Not Currently  . Drug use: No  . Sexual activity: Not on file  Other Topics Concern  . Not on file  Social History Narrative  . Not on file   Social Determinants of Health   Financial Resource Strain:   . Difficulty of Paying Living Expenses: Not on file  Food Insecurity:   . Worried About Programme researcher, broadcasting/film/video in the Last Year: Not on file  . Ran Out of Food in the Last Year: Not on file  Transportation Needs:   . Lack of Transportation (Medical): Not on file  . Lack of Transportation (Non-Medical): Not on file  Physical Activity:   . Days of Exercise per Week: Not on file  . Minutes of Exercise per Session: Not on file  Stress:   . Feeling of Stress : Not on file  Social Connections:   . Frequency of Communication with Friends  and Family: Not on file  . Frequency of Social Gatherings with Friends and Family: Not on file  . Attends Religious Services: Not on file  . Active Member of Clubs or Organizations: Not on file  . Attends Banker Meetings: Not on file  . Marital Status: Not on file     Family History: The patient's family history includes Alcoholism in her father; Diabetes in an other family member; Hyperlipidemia in her mother; Hypertension in her mother; Lung cancer in her father; Stroke in an other family member.  ROS:   Please see the history of present illness.    All other systems reviewed and are negative.  EKGs/Labs/Other Studies Reviewed:  The following studies were reviewed today: I discussed my findings with the patient at length.  EKG reveals sinus rhythm prolonged QT and nonspecific ST-T changes   Recent Labs: 03/13/2020: ALT 23; BUN 16; Creatinine, Ser 1.30; Hemoglobin 15.5; Platelets 379; Potassium 3.5; Sodium 135; TSH 0.886  Recent Lipid Panel    Component Value Date/Time   CHOL 242 (H) 11/24/2019 0934   TRIG 190 (H) 03/13/2020 1539   HDL 71 11/24/2019 0934   CHOLHDL 3.4 11/24/2019 0934   VLDL 33 (H) 10/03/2016 0826   LDLCALC 148 (H) 11/24/2019 0934    Physical Exam:    VS:  BP (!) 161/98   Pulse (!) 102   Ht 5\' 4"  (1.626 m)   Wt 145 lb 1.3 oz (65.8 kg)   SpO2 96%   BMI 24.90 kg/m     Wt Readings from Last 3 Encounters:  03/17/20 145 lb 1.3 oz (65.8 kg)  03/16/20 144 lb 11.2 oz (65.6 kg)  03/13/20 148 lb (67.1 kg)     GEN: Patient is in no acute distress HEENT: Normal NECK: No JVD; No carotid bruits LYMPHATICS: No lymphadenopathy CARDIAC: S1 S2 regular, 2/6 systolic murmur at the apex. RESPIRATORY:  Clear to auscultation without rales, wheezing or rhonchi  ABDOMEN: Soft, non-tender, non-distended MUSCULOSKELETAL:  No edema; No deformity  SKIN: Warm and dry NEUROLOGIC:  Alert and oriented x 3 PSYCHIATRIC:  Normal affect    Signed, 05/13/20, MD  03/17/2020 2:18 PM    [facility]

## 2020-03-17 NOTE — Patient Instructions (Addendum)
Medication Instructions:  Your physician has recommended you make the following change in your medication:   Stop triamterene-hydrochlorothiazide (MAXZIDE-25). Start Toprol XL 50 mg daily.   *If you need a refill on your cardiac medications before your next appointment, please call your pharmacy*   Lab Work: None ordered If you have labs (blood work) drawn today and your tests are completely normal, you will receive your results only by: Marland Kitchen MyChart Message (if you have MyChart) OR . A paper copy in the mail If you have any lab test that is abnormal or we need to change your treatment, we will call you to review the results.   Testing/Procedures: Your physician has requested that you have an echocardiogram. Echocardiography is a painless test that uses sound waves to create images of your heart. It provides your doctor with information about the size and shape of your heart and how well your heart's chambers and valves are working. This procedure takes approximately one hour. There are no restrictions for this procedure.     Follow-Up: At [facility], you and your health needs are our priority.  As part of our continuing mission to provide you with exceptional heart care, we have created designated Provider Care Teams.  These Care Teams include your primary Cardiologist (physician) and Advanced Practice Providers (APPs -  Physician Assistants and Nurse Practitioners) who all work together to provide you with the care you need, when you need it.  We recommend signing up for the patient portal called "MyChart".  Sign up information is provided on this After Visit Summary.  MyChart is used to connect with patients for Virtual Visits (Telemedicine).  Patients are able to view lab/test results, encounter notes, upcoming appointments, etc.  Non-urgent messages can be sent to your provider as well.   To learn more about what you can do with MyChart, go to [email or web address].    Your next  appointment:   2 month(s)  The format for your next appointment:   In Person  Provider:   Belva Crome, MD   Other Instructions Metoprolol Extended-Release Tablets What is this medicine? METOPROLOL (me TOE proe lole) is a beta blocker. It decreases the amount of work your heart has to do and helps your heart beat regularly. It treats high blood pressure and/or prevent chest pain (also called angina). It also treats heart failure. This medicine may be used for other purposes; ask your health care provider or pharmacist if you have questions. COMMON BRAND NAME(S): toprol, Toprol XL What should I tell my health care provider before I take this medicine? They need to know if you have any of these conditions:  diabetes  heart or vessel disease like slow heart rate, worsening heart failure, heart block, sick sinus syndrome or Raynaud's disease  kidney disease  liver disease  lung or breathing disease, like asthma or emphysema  pheochromocytoma  thyroid disease  an unusual or allergic reaction to metoprolol, other beta-blockers, medicines, foods, dyes, or preservatives  pregnant or trying to get pregnant  breast-feeding How should I use this medicine? Take this drug by mouth. Take it as directed on the prescription label at the same time every day. Take it with food. You may cut the tablet in half if it is scored (has a line in the middle of it). This may help you swallow the tablet if the whole tablet is too big. Be sure to take both halves. Do not take just one-half of the tablet. Keep taking it unless  your health care provider tells you to stop. Talk to your health care provider about the use of this drug in children. While it may be prescribed for children as young as 6 for selected conditions, precautions do apply. Overdosage: If you think you have taken too much of this medicine contact a poison control center or emergency room at once. NOTE: This medicine is only for you.  Do not share this medicine with others. What if I miss a dose? If you miss a dose, take it as soon as you can. If it is almost time for your next dose, take only that dose. Do not take double or extra doses. What may interact with this medicine? This medicine may interact with the following medications:  certain medicines for blood pressure, heart disease, irregular heart beat  certain medicines for depression, like monoamine oxidase (MAO) inhibitors, fluoxetine, or paroxetine  clonidine  dobutamine  epinephrine  isoproterenol  reserpine This list may not describe all possible interactions. Give your health care provider a list of all the medicines, herbs, non-prescription drugs, or dietary supplements you use. Also tell them if you smoke, drink alcohol, or use illegal drugs. Some items may interact with your medicine. What should I watch for while using this medicine? Visit your doctor or health care professional for regular check ups. Contact your doctor right away if your symptoms worsen. Check your blood pressure and pulse rate regularly. Ask your health care professional what your blood pressure and pulse rate should be, and when you should contact them. You may get drowsy or dizzy. Do not drive, use machinery, or do anything that needs mental alertness until you know how this medicine affects you. Do not sit or stand up quickly, especially if you are an older patient. This reduces the risk of dizzy or fainting spells. Contact your doctor if these symptoms continue. Alcohol may interfere with the effect of this medicine. Avoid alcoholic drinks. This medicine may increase blood sugar. Ask your healthcare provider if changes in diet or medicines are needed if you have diabetes. What side effects may I notice from receiving this medicine? Side effects that you should report to your doctor or health care professional as soon as possible:  allergic reactions like skin rash, itching or  hives  cold or numb hands or feet  depression  difficulty breathing  faint  fever with sore throat  irregular heartbeat, chest pain  rapid weight gain   signs and symptoms of high blood sugar such as being more thirsty or hungry or having to urinate more than normal. You may also feel very tired or have blurry vision.  swollen legs or ankles Side effects that usually do not require medical attention (report to your doctor or health care professional if they continue or are bothersome):  anxiety or nervousness  change in sex drive or performance  dry skin  headache  nightmares or trouble sleeping  short term memory loss  stomach upset or diarrhea This list may not describe all possible side effects. Call your doctor for medical advice about side effects. You may report side effects to FDA at 1-800-FDA-1088. Where should I keep my medicine? Keep out of the reach of children and pets. Store at room temperature between 20 and 25 degrees C (68 and 77 degrees F). Throw away any unused drug after the expiration date. NOTE: This sheet is a summary. It may not cover all possible information. If you have questions about this medicine, talk to  your doctor, pharmacist, or health care provider.  2020 Elsevier/Gold Standard (2019-01-30 18:23:00)   Echocardiogram An echocardiogram is a procedure that uses painless sound waves (ultrasound) to produce an image of the heart. Images from an echocardiogram can provide important information about:  Signs of coronary artery disease (CAD).  Aneurysm detection. An aneurysm is a weak or damaged part of an artery wall that bulges out from the normal force of blood pumping through the body.  Heart size and shape. Changes in the size or shape of the heart can be associated with certain conditions, including heart failure, aneurysm, and CAD.  Heart muscle function.  Heart valve function.  Signs of a past heart attack.  Fluid buildup  around the heart.  Thickening of the heart muscle.  A tumor or infectious growth around the heart valves. Tell a health care provider about:  Any allergies you have.  All medicines you are taking, including vitamins, herbs, eye drops, creams, and over-the-counter medicines.  Any blood disorders you have.  Any surgeries you have had.  Any medical conditions you have.  Whether you are pregnant or may be pregnant. What are the risks? Generally, this is a safe procedure. However, problems may occur, including:  Allergic reaction to dye (contrast) that may be used during the procedure. What happens before the procedure? No specific preparation is needed. You may eat and drink normally. What happens during the procedure?   An IV tube may be inserted into one of your veins.  You may receive contrast through this tube. A contrast is an injection that improves the quality of the pictures from your heart.  A gel will be applied to your chest.  A wand-like tool (transducer) will be moved over your chest. The gel will help to transmit the sound waves from the transducer.  The sound waves will harmlessly bounce off of your heart to allow the heart images to be captured in real-time motion. The images will be recorded on a computer. The procedure may vary among health care providers and hospitals. What happens after the procedure?  You may return to your normal, everyday life, including diet, activities, and medicines, unless your health care provider tells you not to do that. Summary  An echocardiogram is a procedure that uses painless sound waves (ultrasound) to produce an image of the heart.  Images from an echocardiogram can provide important information about the size and shape of your heart, heart muscle function, heart valve function, and fluid buildup around your heart.  You do not need to do anything to prepare before this procedure. You may eat and drink normally.  After  the echocardiogram is completed, you may return to your normal, everyday life, unless your health care provider tells you not to do that. This information is not intended to replace advice given to you by your health care provider. Make sure you discuss any questions you have with your health care provider. Document Revised: 10/10/2018 Document Reviewed: 07/22/2016 Elsevier Patient Education  2020 [employer].

## 2020-03-18 LAB — CULTURE, BLOOD (ROUTINE X 2)
Culture: NO GROWTH
Culture: NO GROWTH
Special Requests: ADEQUATE
Special Requests: ADEQUATE

## 2020-03-19 LAB — TEST AUTHORIZATION

## 2020-03-19 LAB — COMPLETE METABOLIC PANEL WITH GFR

## 2020-03-20 LAB — TEST AUTHORIZATION

## 2020-03-20 LAB — COMPLETE METABOLIC PANEL WITH GFR
AG Ratio: 1.7 (calc) (ref 1.0–2.5)
ALT: 13 U/L (ref 6–29)
AST: 17 U/L (ref 10–35)
Albumin: 4.5 g/dL (ref 3.6–5.1)
Alkaline phosphatase (APISO): 79 U/L (ref 37–153)
BUN/Creatinine Ratio: 17 (calc) (ref 6–22)
BUN: 20 mg/dL (ref 7–25)
CO2: 27 mmol/L (ref 20–32)
Calcium: 9.7 mg/dL (ref 8.6–10.4)
Chloride: 102 mmol/L (ref 98–110)
Creat: 1.16 mg/dL — ABNORMAL HIGH (ref 0.50–0.99)
GFR, Est African American: 57 mL/min/{1.73_m2} — ABNORMAL LOW (ref >=60)
GFR, Est Non African American: 49 mL/min/{1.73_m2} — ABNORMAL LOW (ref >=60)
Globulin: 2.6 g/dL (calc) (ref 1.9–3.7)
Glucose, Bld: 99 mg/dL (ref 65–99)
Potassium: 3.6 mmol/L (ref 3.5–5.3)
Sodium: 138 mmol/L (ref 135–146)
Total Bilirubin: 0.5 mg/dL (ref 0.2–1.2)
Total Protein: 7.1 g/dL (ref 6.1–8.1)

## 2020-03-20 LAB — MAGNESIUM: Magnesium: 1.9 mg/dL (ref 1.5–2.5)

## 2020-04-09 ENCOUNTER — Other Ambulatory Visit: Payer: Self-pay

## 2020-04-09 ENCOUNTER — Ambulatory Visit (HOSPITAL_BASED_OUTPATIENT_CLINIC_OR_DEPARTMENT_OTHER)
Admission: RE | Admit: 2020-04-09 | Discharge: 2020-04-09 | Disposition: A | Discharge status: Home / Self Care | Payer: Medicare PPO | Source: Ambulatory Visit | Attending: Cardiology | Admitting: Cardiology

## 2020-04-09 DIAGNOSIS — R002 Palpitations: Secondary | ICD-10-CM | POA: Insufficient documentation

## 2020-04-09 DIAGNOSIS — J449 Chronic obstructive pulmonary disease, unspecified: Secondary | ICD-10-CM | POA: Diagnosis not present

## 2020-04-09 DIAGNOSIS — R011 Cardiac murmur, unspecified: Secondary | ICD-10-CM

## 2020-04-09 DIAGNOSIS — Q249 Congenital malformation of heart, unspecified: Secondary | ICD-10-CM | POA: Insufficient documentation

## 2020-04-09 DIAGNOSIS — I10 Essential (primary) hypertension: Secondary | ICD-10-CM | POA: Insufficient documentation

## 2020-04-09 DIAGNOSIS — E785 Hyperlipidemia, unspecified: Secondary | ICD-10-CM | POA: Diagnosis not present

## 2020-04-09 LAB — ECHOCARDIOGRAM COMPLETE
Area-P 1/2: 3.91 cm2
S' Lateral: 2.33 cm
Single Plane A4C EF: 61.2 %

## 2020-04-13 ENCOUNTER — Ambulatory Visit (INDEPENDENT_AMBULATORY_CARE_PROVIDER_SITE_OTHER): Payer: Medicare PPO | Admitting: Medical-Surgical

## 2020-04-13 ENCOUNTER — Telehealth: Payer: Self-pay

## 2020-04-13 ENCOUNTER — Encounter: Payer: Self-pay | Admitting: Medical-Surgical

## 2020-04-13 VITALS — BP 160/84 | HR 85 | Temp 97.8°F | Ht 64.0 in | Wt 150.1 lb

## 2020-04-13 DIAGNOSIS — R002 Palpitations: Secondary | ICD-10-CM

## 2020-04-13 DIAGNOSIS — T452X5A Adverse effect of vitamins, initial encounter: Secondary | ICD-10-CM | POA: Diagnosis not present

## 2020-04-13 DIAGNOSIS — U099 Post covid-19 condition, unspecified: Secondary | ICD-10-CM

## 2020-04-13 DIAGNOSIS — I10 Essential (primary) hypertension: Secondary | ICD-10-CM | POA: Diagnosis not present

## 2020-04-13 DIAGNOSIS — F4323 Adjustment disorder with mixed anxiety and depressed mood: Secondary | ICD-10-CM | POA: Diagnosis not present

## 2020-04-13 NOTE — Telephone Encounter (Signed)
Pt aware of approval. PCP changed in pt's chart.  

## 2020-04-13 NOTE — Patient Instructions (Signed)
Zinc and Healthful Diets The federal governments [phone] Dietary Guidelines for Americans notes that Because foods provide an array of nutrients and other components that have benefits for health, nutritional needs should be met primarily through foods.  In some cases, fortified foods and dietary supplements are useful when it is not possible otherwise to meet needs for one or more nutrients (e.g., during specific life stages such as pregnancy).  For more information about building a healthy dietary pattern, refer to the Dietary Guidelines for Americansexternal link disclaimer and the U.S. Department of Agricultures MyPlate.external link disclaimer  The Dietary Guidelines for Americans describes a healthy dietary pattern as one that:  Includes a variety of vegetables; fruits; grains (at least half whole grains); fat-free and low-fat milk, yogurt, and cheese; and oils. Whole grains and milk products are good sources of zinc. Many ready-to-eat breakfast cereals are fortified with zinc. Includes a variety of protein foods such as lean meats; poultry; eggs; seafood; beans, peas, and lentils; nuts and seeds; and soy products. Oysters, red meat, and poultry are excellent sources of zinc. Baked beans, chickpeas, and nuts (such as cashews and almonds) also contain zinc. Limits foods and beverages higher in added sugars, saturated fat, and sodium.  Limits alcoholic beverages.  Stays within your daily calorie needs.

## 2020-04-13 NOTE — Telephone Encounter (Signed)
Pt would like to change PCP's within the practice: °  °Current PCP: Dr. Alexander °Requested PCP: Joy Jessup, FNP °  °Please advise regarding approval/denial °

## 2020-04-13 NOTE — Telephone Encounter (Signed)
Fine with me if fine w/ Ander Slade

## 2020-04-13 NOTE — Telephone Encounter (Signed)
That's fine with me as well.  

## 2020-04-13 NOTE — Progress Notes (Signed)
Subjective:    CC: anxiety, palpitations, post-COVID follow up  HPI: Pleasant 64 year old female presenting today for the following:  Anxiety- Feels so much better now that she has seen cardiology and has been reassured that there is nothing serious or life-threatening going on.   Palpitations- No further issues with palpitations. Saw cardiology and will follow up with them again next month.  Post-COVID- some increased hair loss but no other lingering effects. Appetite has returned, eating well.   HTN- blood pressure has been elevated at the last few appointments. Patient monitors at home with readings from 120-130/70-89. She has noticed an increase at times to SBP of 159. Taking Toprol XL 50mg  daily but cuts the pill in half and takes 1/2 in the morning and the other 1/2 in the evening.   MVI- taking a daily multivitamin but notes that she gets nauseated after it. She gets her MVI OTC and has tried several different brands. Has not found one that works for her. Has texture issues so she can't tolerate gummies. Also wonders if she should take a zinc supplement for overall health and prevention of illness.   I reviewed the past medical history, family history, social history, surgical history, and allergies today and no changes were needed.  Please see the problem list section below in epic for further details.  Past Medical History: Past Medical History:  Diagnosis Date  . AC (acromioclavicular) joint bone spurs   . Biceps tendinitis on right 06/22/2015  . Breast mass 07/11/2013   Ultrasound needed January 2018   . Chronic obstructive pulmonary disease with acute exacerbation (HCC) 03/04/2020  . Closed displaced fracture of proximal phalanx of third toe of right foot 12/30/2019  . Essential hypertension, benign 06/10/2013  . Flexural eczema 11/23/2016  . Health education 03/04/2020  . 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 History of abnormal cervical Pap smear 06/19/2014   Cryotherapy was  required in the mid 1990s   . History of colonic polyps 06/19/2013   Dr. 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   2022 Hyperlipidemia 06/10/2013  . Lumbar strain 01/06/2014  . 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 Postmenopausal bleeding 08/17/2014  . Primary osteoarthritis of both knees 08/31/2016  . Right knee pain 06/10/2013   Workers Comp: [facility]. 14/03/2013   . Septal defect 06/10/2013  . Serous otitis media 01/06/2015  . Situational anxiety 06/10/2013  . TIA (transient ischemic attack) 06/10/2013   Past Surgical History: Past Surgical History:  Procedure Laterality Date  . bicep surgery Right   . broken hip  2000  . FOOT TENDON SURGERY  2000  . GALLBLADDER SURGERY    . orthoscopic knee surgery  10/2012  . TONSILLECTOMY  1959  . TUBAL LIGATION  1988   Social History: Social History   Socioeconomic History  . Marital status: Married    Spouse name: Not on file  . Number of children: Not on file  . Years of education: Not on file  . Highest education level: Not on file  Occupational History  . Occupation: 12/2012  Tobacco Use  . Smoking status: Former Smoker    Packs/day: 1.00    Years: 25.00    Pack years: 25.00    Types: Cigarettes    Quit date: 07/03/1997    Years since quitting: 22.7  . Smokeless tobacco: Never Used  Vaping Use  . Vaping Use: Never used  Substance and Sexual  Activity  . Alcohol use: Not Currently  . Drug use: No  . Sexual activity: Not on file  Other Topics Concern  . Not on file  Social History Narrative  . Not on file   Social Determinants of Health   Financial Resource Strain:   . Difficulty of Paying Living Expenses: Not on file  Food Insecurity:   . Worried About Programme researcher, broadcasting/film/video in the Last Year: Not on file  . Ran Out of Food in the Last Year: Not on file  Transportation Needs:   . Lack of Transportation (Medical): Not on file  . Lack of Transportation (Non-Medical): Not  on file  Physical Activity:   . Days of Exercise per Week: Not on file  . Minutes of Exercise per Session: Not on file  Stress:   . Feeling of Stress : Not on file  Social Connections:   . Frequency of Communication with Friends and Family: Not on file  . Frequency of Social Gatherings with Friends and Family: Not on file  . Attends Religious Services: Not on file  . Active Member of Clubs or Organizations: Not on file  . Attends Banker Meetings: Not on file  . Marital Status: Not on file   Family History: Family History  Problem Relation Age of Onset  . Alcoholism Father   . Lung cancer Father   . Diabetes Other        grandmother  . Hyperlipidemia Mother   . Hypertension Mother   . Stroke Other        grandmother   Allergies: Allergies  Allergen Reactions  . Amlodipine     edema  . Bee Venom   . Ibuprofen     REACTION: breathing difficulty  . Lisinopril     tongue swelling   Medications: See med rec.  Review of Systems: .   Objective:    General: Well Developed, well nourished, and in no acute distress.  Neuro: Alert and oriented x3.  HEENT: Normocephalic, atraumatic.  Skin: Warm and dry. Cardiac: Regular rate and rhythm, no murmurs rubs or gallops, no lower extremity edema.  Respiratory: Clear to auscultation bilaterally. Not using accessory muscles, speaking in full sentences.   Impression and Recommendations:    1. Situational mixed anxiety and depressive disorder Doing well without medication. Continue to use hydroxyzine for severe anxiety if needed. No further intervention at this time.   2. Palpitations Resolved. Followed by cardiology.  3. Post-COVID-19 condition Hair loss likely related to poor nutritional intake for several weeks while ill with acute COVID. Some hair loss also seen with COVID infection. This may take 3-6 months to fully resolve so monitor for development of other signs/symptoms or worsening of the hair loss. If not  resolved in 3-6 months, may benefit from further workup and possibly a dermatology referral.   4. Essential hypertension, benign Not at goal in the office today. Patient does have anxiety related to medical appointments. Her home blood pressures seem to be stable but the fluctuations may be related to cutting the Toprol. Recommend taking Toprol-XL 50mg  daily and avoid cutting the pill. Discussed the nature of an extended release medication.  Recommend monitoring at home with a goal of 130/80 or less. If consistently seeing readings higher than that, return for further evaluation and medication adjustment.   5. Adverse effect of vitamin Recommend taking MVI at night, away from other medications. Also try different MVI brands. Consider Flintstones vitamins if she can tolerate  those.   Return for follow up as scheduled in November. ___________________________________________ Thayer Ohm, DNP, APRN, FNP-BC Primary Care and Sports Medicine [facility]

## 2020-05-07 DIAGNOSIS — I10 Essential (primary) hypertension: Secondary | ICD-10-CM

## 2020-05-07 DIAGNOSIS — Z86010 Personal history of colonic polyps: Secondary | ICD-10-CM | POA: Diagnosis not present

## 2020-05-07 DIAGNOSIS — I340 Nonrheumatic mitral (valve) insufficiency: Secondary | ICD-10-CM

## 2020-05-07 DIAGNOSIS — D123 Benign neoplasm of transverse colon: Secondary | ICD-10-CM | POA: Diagnosis not present

## 2020-05-07 DIAGNOSIS — M7580 Other shoulder lesions, unspecified shoulder: Secondary | ICD-10-CM | POA: Insufficient documentation

## 2020-05-07 DIAGNOSIS — K635 Polyp of colon: Secondary | ICD-10-CM | POA: Diagnosis not present

## 2020-05-07 HISTORY — DX: Nonrheumatic mitral (valve) insufficiency: I34.0

## 2020-05-07 HISTORY — DX: Essential (primary) hypertension: I10

## 2020-05-07 LAB — HM COLONOSCOPY

## 2020-05-10 ENCOUNTER — Other Ambulatory Visit: Payer: Self-pay

## 2020-05-10 DIAGNOSIS — Q249 Congenital malformation of heart, unspecified: Secondary | ICD-10-CM | POA: Insufficient documentation

## 2020-05-11 ENCOUNTER — Ambulatory Visit: Payer: Medicare PPO | Admitting: Cardiology

## 2020-05-11 ENCOUNTER — Other Ambulatory Visit: Payer: Self-pay

## 2020-05-11 ENCOUNTER — Encounter: Payer: Self-pay | Admitting: Cardiology

## 2020-05-11 VITALS — BP 164/88 | HR 89 | Ht 64.0 in | Wt 150.1 lb

## 2020-05-11 DIAGNOSIS — R002 Palpitations: Secondary | ICD-10-CM

## 2020-05-11 DIAGNOSIS — I10 Essential (primary) hypertension: Secondary | ICD-10-CM

## 2020-05-11 DIAGNOSIS — E782 Mixed hyperlipidemia: Secondary | ICD-10-CM | POA: Diagnosis not present

## 2020-05-11 DIAGNOSIS — I340 Nonrheumatic mitral (valve) insufficiency: Secondary | ICD-10-CM

## 2020-05-11 NOTE — Patient Instructions (Signed)

## 2020-05-11 NOTE — Progress Notes (Signed)
Cardiology Office Note:    Date:  05/11/2020   ID:  Lindsay Moore, DOB May 13, 1956, MRN [id number]  PCP:  Christen Butter, NP  Cardiologist:  Garwin Brothers, MD   Referring MD: Sunnie Nielsen, DO    ASSESSMENT:    1. Essential hypertension, benign   2. Mixed hyperlipidemia   3. Mild mitral valve regurgitation    PLAN:    In order of problems listed above:  1. Primary prevention stressed with the patient.  Importance of compliance with diet medication stressed and she vocalized understanding.  She was advised to walk at least half an hour a day on a regular basis and she promises to do so. 2. Essential hypertension: Blood pressure stable and diet was emphasized.  She has an element of whitecoat hypertension. 3. Mixed dyslipidemia.  Diet was emphasized lipids were reviewed she plans to do better with diet and exercise and we will recheck this before her next visit in 6 months. 4. Mild mitral regurgitation: Echocardiogram report was discussed with her at length and medical and risk management was urged. 5. She has had CT scanning of the chest to rule out PE recently and there was no evidence of coronary calcifications and I discussed this with her at length.  Questions were answered to satisfaction.   Medication Adjustments/Labs and Tests Ordered: Current medicines are reviewed at length with the patient today.  Concerns regarding medicines are outlined above.  No orders of the defined types were placed in this encounter.  No orders of the defined types were placed in this encounter.    No chief complaint on file.    History of Present Illness:    Lindsay Moore is a 64 y.o. female.  Patient has past medical history of essential hypertension dyslipidemia and mitral regurgitation.  She denies any problems at this time and takes care of activities of daily living.  No chest pain orthopnea or PND.  At the time of my evaluation, the patient is alert awake oriented  and in no distress.  She walks him on a regular basis.  She mentions to me that the beta-blocker has helped her heart rate and blood pressure significantly.  She tells me that she is happy to know that her blood pressure is better at home in the range of 1 30-1 40 systolic and 70 diastolic in the resting heart rate can be in the 60s.  At the time of my evaluation, the patient is alert awake oriented and in no distress.  Past Medical History:  Diagnosis Date  . AC (acromioclavicular) joint bone spurs   . Biceps tendinitis on right 06/22/2015  . Breast mass 07/11/2013   Ultrasound needed January 2018   . Chronic obstructive pulmonary disease with acute exacerbation (HCC) 03/04/2020  . Closed displaced fracture of proximal phalanx of third toe of right foot 12/30/2019  . Essential hypertension, benign 06/10/2013  . Flexural eczema 11/23/2016  . Health education 03/04/2020  . 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 High blood pressure 05/07/2020  . History of abnormal cervical Pap smear 06/19/2014   Cryotherapy was required in the mid 1990s   . History of colonic polyps 06/19/2013   Dr. 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   Marland Kitchen Hyperlipidemia 06/10/2013  . Lumbar strain 01/06/2014  . Mild mitral valve regurgitation 05/07/2020  . 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 Palpitations 03/17/2020  . Postmenopausal bleeding 08/17/2014  .  Primary osteoarthritis of both knees 08/31/2016  . Right knee pain 06/10/2013   Workers Comp: [facility] Dr. Luiz Blare   . Septal defect 06/10/2013  . Serous otitis media 01/06/2015  . Situational anxiety 06/10/2013  . TIA (transient ischemic attack) 06/10/2013    Past Surgical History:  Procedure Laterality Date  . bicep surgery Right   . broken hip  2000  . FOOT TENDON SURGERY  2000  . GALLBLADDER SURGERY    . orthoscopic knee surgery  10/2012  . TONSILLECTOMY  1959  . TUBAL LIGATION  1988    Current  Medications: Current Meds  Medication Sig  . acetaminophen (TYLENOL) 650 MG CR tablet Take 1 tablet (650 mg total) by mouth every 8 (eight) hours as needed for pain.  Marland Kitchen albuterol (VENTOLIN HFA) 108 (90 Base) MCG/ACT inhaler Inhale 1-2 puffs into the lungs every 6 (six) hours as needed for wheezing or shortness of breath.  Marland Kitchen aspirin EC 81 MG tablet Take 1 tablet (81 mg total) by mouth daily.  . Calcium Carb-Cholecalciferol (CALCIUM + D3 PO) Take by mouth.  Marland Kitchen MEGARED OMEGA-3 KRILL OIL PO Take by mouth.  . metoprolol succinate (TOPROL-XL) 50 MG 24 hr tablet Take 1 tablet (50 mg total) by mouth daily. Take with or immediately following a meal.  . Multiple Vitamin (MULTIVITAMIN) tablet Take 1 tablet by mouth daily.  . potassium chloride (KLOR-CON) 10 MEQ tablet Take 10 mEq by mouth daily.   Marland Kitchen triamcinolone ointment (KENALOG) 0.1 % Apply 1 application topically 2 (two) times daily. To affected areas     Allergies:   Amlodipine, Bee venom, Lisinopril, and Ibuprofen   Social History   Socioeconomic History  . Marital status: Married    Spouse name: Not on file  . Number of children: Not on file  . Years of education: Not on file  . Highest education level: Not on file  Occupational History  . Occupation: Runner, broadcasting/film/video  Tobacco Use  . Smoking status: Former Smoker    Packs/day: 1.00    Years: 25.00    Pack years: 25.00    Types: Cigarettes    Quit date: 07/03/1997    Years since quitting: 22.8  . Smokeless tobacco: Never Used  Vaping Use  . Vaping Use: Never used  Substance and Sexual Activity  . Alcohol use: Not Currently  . Drug use: No  . Sexual activity: Not on file  Other Topics Concern  . Not on file  Social History Narrative  . Not on file   Social Determinants of Health   Financial Resource Strain:   . Difficulty of Paying Living Expenses: Not on file  Food Insecurity:   . Worried About Programme researcher, broadcasting/film/video in the Last Year: Not on file  . Ran Out of Food in the Last Year:  Not on file  Transportation Needs:   . Lack of Transportation (Medical): Not on file  . Lack of Transportation (Non-Medical): Not on file  Physical Activity:   . Days of Exercise per Week: Not on file  . Minutes of Exercise per Session: Not on file  Stress:   . Feeling of Stress : Not on file  Social Connections:   . Frequency of Communication with Friends and Family: Not on file  . Frequency of Social Gatherings with Friends and Family: Not on file  . Attends Religious Services: Not on file  . Active Member of Clubs or Organizations: Not on file  . Attends Banker Meetings:  Not on file  . Marital Status: Not on file     Family History: The patient's family history includes Alcoholism in her father; Diabetes in an other family member; Hyperlipidemia in her mother; Hypertension in her mother; Lung cancer in her father; Stroke in an other family member.  ROS:   Please see the history of present illness.    All other systems reviewed and are negative.  EKGs/Labs/Other Studies Reviewed:    The following studies were reviewed today: IMPRESSIONS    1. Left ventricular ejection fraction, by estimation, is 60 to 65%. The  left ventricle has normal function. The left ventricle has no regional  wall motion abnormalities. Left ventricular diastolic parameters were  normal.  2. Right ventricular systolic function is normal. The right ventricular  size is normal.  3. The mitral valve is normal in structure. Mild mitral valve  regurgitation. No evidence of mitral stenosis.  4. The aortic valve is normal in structure. Aortic valve regurgitation is  not visualized. No aortic stenosis is present.  5. The inferior vena cava is normal in size with greater than 50%  respiratory variability, suggesting right atrial pressure of 3 mmHg.    Recent Labs: 03/13/2020: Hemoglobin 15.5; Platelets 379; TSH 0.886 03/17/2020: ALT 13; BUN 20; Creat 1.16; Magnesium 1.9; Potassium 3.6;  Sodium 138  Recent Lipid Panel    Component Value Date/Time   CHOL 242 (H) 11/24/2019 0934   TRIG 190 (H) 03/13/2020 1539   HDL 71 11/24/2019 0934   CHOLHDL 3.4 11/24/2019 0934   VLDL 33 (H) 10/03/2016 0826   LDLCALC 148 (H) 11/24/2019 0934    Physical Exam:    VS:  BP (!) 164/88   Pulse 89   Ht 5\' 4"  (1.626 m)   Wt 150 lb 1.3 oz (68.1 kg)   SpO2 98%   BMI 25.76 kg/m     Wt Readings from Last 3 Encounters:  05/11/20 150 lb 1.3 oz (68.1 kg)  04/13/20 150 lb 1.6 oz (68.1 kg)  03/17/20 145 lb 1.3 oz (65.8 kg)     GEN: Patient is in no acute distress HEENT: Normal NECK: No JVD; No carotid bruits LYMPHATICS: No lymphadenopathy CARDIAC: Hear sounds regular, 2/6 systolic murmur at the apex. RESPIRATORY:  Clear to auscultation without rales, wheezing or rhonchi  ABDOMEN: Soft, non-tender, non-distended MUSCULOSKELETAL:  No edema; No deformity  SKIN: Warm and dry NEUROLOGIC:  Alert and oriented x 3 PSYCHIATRIC:  Normal affect   Signed, 03/19/20, MD  05/11/2020 8:56 AM    [facility]

## 2020-05-26 ENCOUNTER — Ambulatory Visit: Payer: Medicare PPO | Admitting: Osteopathic Medicine

## 2020-05-26 ENCOUNTER — Encounter: Payer: Self-pay | Admitting: Medical-Surgical

## 2020-05-26 ENCOUNTER — Ambulatory Visit (INDEPENDENT_AMBULATORY_CARE_PROVIDER_SITE_OTHER): Payer: Medicare PPO | Admitting: Medical-Surgical

## 2020-05-26 VITALS — BP 157/86 | HR 92 | Temp 97.9°F | Ht 64.0 in | Wt 152.8 lb

## 2020-05-26 DIAGNOSIS — E782 Mixed hyperlipidemia: Secondary | ICD-10-CM | POA: Diagnosis not present

## 2020-05-26 DIAGNOSIS — J441 Chronic obstructive pulmonary disease with (acute) exacerbation: Secondary | ICD-10-CM

## 2020-05-26 DIAGNOSIS — Z23 Encounter for immunization: Secondary | ICD-10-CM

## 2020-05-26 DIAGNOSIS — F418 Other specified anxiety disorders: Secondary | ICD-10-CM | POA: Diagnosis not present

## 2020-05-26 DIAGNOSIS — R002 Palpitations: Secondary | ICD-10-CM

## 2020-05-26 DIAGNOSIS — M170 Bilateral primary osteoarthritis of knee: Secondary | ICD-10-CM | POA: Diagnosis not present

## 2020-05-26 DIAGNOSIS — L659 Nonscarring hair loss, unspecified: Secondary | ICD-10-CM | POA: Diagnosis not present

## 2020-05-26 DIAGNOSIS — I10 Essential (primary) hypertension: Secondary | ICD-10-CM

## 2020-05-26 NOTE — Patient Instructions (Signed)

## 2020-05-26 NOTE — Progress Notes (Signed)
Subjective:    CC: chronic disease follow up  HPI: Pleasant 64 year old female presenting today for follow-up of the following:  Hypertension-blood pressure has been very labile lately with readings of 120s/80s but other times her systolic blood pressure goes in the 150s-160s.  She does have significant anxiety around taking her blood pressure so this does not help [place] get an accurate reading.  She has been monitoring her blood pressure at home with similar results.  Currently taking Toprol-XL 50 mg daily, tolerating well without side effects. Denies CP, SOB, palpitations, lower extremity edema, dizziness, headaches, or vision changes.  Palpitations-resolved since starting Toprol-XL.  COPD-She has not had to use her albuterol inhaler at all.  Does note mild shortness of breath with exercise but otherwise doing well.    Osteoarthritis-she does continue to have some joint pains but notes that since she started walking for exercise at least 30 minutes a day, she is doing better in this area.  Situational anxiety-notes that she is overall doing much better with her anxiety.  She does still have some that pops up here and there regarding certain situations but does not feel she needs a medication to manage at this point.  Hyperlipidemia-she is taking omega-3 Krill oil daily and has recently started exercising.  I reviewed the past medical history, family history, social history, surgical history, and allergies today and no changes were needed.  Please see the problem list section below in epic for further details.  Past Medical History: Past Medical History:  Diagnosis Date  . AC (acromioclavicular) joint bone spurs   . Biceps tendinitis on right 06/22/2015  . Breast mass 07/11/2013   Ultrasound needed January 2018   . Chronic obstructive pulmonary disease with acute exacerbation (HCC) 03/04/2020  . Closed displaced fracture of proximal phalanx of third toe of right foot 12/30/2019  . Essential  hypertension, benign 06/10/2013  . Flexural eczema 11/23/2016  . Health education 03/04/2020  . 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 High blood pressure 05/07/2020  . History of abnormal cervical Pap smear 06/19/2014   Cryotherapy was required in the mid 1990s   . History of colonic polyps 06/19/2013   Dr. 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   Marland Kitchen Hyperlipidemia 06/10/2013  . Lumbar strain 01/06/2014  . Mild mitral valve regurgitation 05/07/2020  . 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 Palpitations 03/17/2020  . Postmenopausal bleeding 08/17/2014  . Primary osteoarthritis of both knees 08/31/2016  . Right knee pain 06/10/2013   Workers Comp: [facility] Dr. Luiz Blare   . Septal defect 06/10/2013  . Serous otitis media 01/06/2015  . Situational anxiety 06/10/2013  . TIA (transient ischemic attack) 06/10/2013   Past Surgical History: Past Surgical History:  Procedure Laterality Date  . bicep surgery Right   . broken hip  2000  . FOOT TENDON SURGERY  2000  . GALLBLADDER SURGERY    . orthoscopic knee surgery  10/2012  . TONSILLECTOMY  1959  . TUBAL LIGATION  1988   Social History: Social History   Socioeconomic History  . Marital status: Married    Spouse name: Not on file  . Number of children: Not on file  . Years of education: Not on file  . Highest education level: Not on file  Occupational History  . Occupation: Runner, broadcasting/film/video  Tobacco Use  . Smoking status: Former Smoker    Packs/day: 1.00    Years: 25.00  Pack years: 25.00    Types: Cigarettes    Quit date: 07/03/1997    Years since quitting: 22.9  . Smokeless tobacco: Never Used  Vaping Use  . Vaping Use: Never used  Substance and Sexual Activity  . Alcohol use: Not Currently  . Drug use: No  . Sexual activity: Not on file  Other Topics Concern  . Not on file  Social History Narrative  . Not on file   Social Determinants of Health   Financial Resource Strain:    . Difficulty of Paying Living Expenses: Not on file  Food Insecurity:   . Worried About Programme researcher, broadcasting/film/video in the Last Year: Not on file  . Ran Out of Food in the Last Year: Not on file  Transportation Needs:   . Lack of Transportation (Medical): Not on file  . Lack of Transportation (Non-Medical): Not on file  Physical Activity:   . Days of Exercise per Week: Not on file  . Minutes of Exercise per Session: Not on file  Stress:   . Feeling of Stress : Not on file  Social Connections:   . Frequency of Communication with Friends and Family: Not on file  . Frequency of Social Gatherings with Friends and Family: Not on file  . Attends Religious Services: Not on file  . Active Member of Clubs or Organizations: Not on file  . Attends Banker Meetings: Not on file  . Marital Status: Not on file   Family History: Family History  Problem Relation Age of Onset  . Alcoholism Father   . Lung cancer Father   . Diabetes Other        grandmother  . Hyperlipidemia Mother   . Hypertension Mother   . Stroke Other        grandmother   Allergies: Allergies  Allergen Reactions  . Amlodipine Other (See Comments)    edema edema  . Bee Venom Other (See Comments)  . Lisinopril Other (See Comments)    tongue swelling tongue swelling  . Ibuprofen     REACTION: breathing difficulty   Medications: See med rec.  Review of Systems: See HPI for pertinent positives and negatives.   Objective:    General: Well Developed, well nourished, and in no acute distress.  Neuro: Alert and oriented x3.  HEENT: Normocephalic, atraumatic. Skin: Warm and dry. Cardiac: Regular rate and rhythm, no murmurs rubs or gallops, no lower extremity edema.  Respiratory: Clear to auscultation bilaterally. Not using accessory muscles, speaking in full sentences.   Impression and Recommendations:    1. Primary hypertension Checking CBC and CMP today.  Continue Toprol-XL 50 mg daily.  With her blood  pressure being so labile, recommend monitoring at home with a goal of 140/90 or less.  If her readings are consistently higher than that, advised patient to contact me and we may need to adjust her medications. - CBC - COMPLETE METABOLIC PANEL WITH GFR  2. Palpitations Continue Toprol XL 50 mg daily.  3. Chronic obstructive pulmonary disease with acute exacerbation (HCC) Stable.  Albuterol inhaler as needed.  4. Primary osteoarthritis of both knees Continue exercise daily and Tylenol as needed.    5. Situational anxiety Stable without medication at this time.  Patient will advise me if this changes.  7. Mixed hyperlipidemia Checking lipid panel today.  Continue omega-3 Krill oil. - Lipid panel  8. Need for influenza vaccination Flu vaccine given in office today.  9. Hair loss Discussed etiology  of hair loss.  Recent Covid infection so that is a consideration.  Also consider possible nutrient deficiency.  We will go ahead and check a TSH with her lab work today to rule out thyroid etiology.  Advised patient that her loss can last 3 to 6 months before it starts to slow depending on the etiology. - TSH  Return in about 6 months (around 11/23/2020) for chronic disease follow up. ___________________________________________ Thayer Ohm, DNP, APRN, FNP-BC Primary Care and Sports Medicine [facility] [place]

## 2020-05-27 LAB — CBC
HCT: 45.4 % — ABNORMAL HIGH (ref 35.0–45.0)
Hemoglobin: 14.9 g/dL (ref 11.7–15.5)
MCH: 29.6 pg (ref 27.0–33.0)
MCHC: 32.8 g/dL (ref 32.0–36.0)
MCV: 90.1 fL (ref 80.0–100.0)
MPV: 11.2 fL (ref 7.5–12.5)
Platelets: 319 10*3/uL (ref 140–400)
RBC: 5.04 10*6/uL (ref 3.80–5.10)
RDW: 12.5 % (ref 11.0–15.0)
WBC: 9.1 10*3/uL (ref 3.8–10.8)

## 2020-05-27 LAB — LIPID PANEL
Cholesterol: 237 mg/dL — ABNORMAL HIGH (ref <200)
HDL: 70 mg/dL (ref >=50)
LDL Cholesterol (Calc): 136 mg/dL (calc) — ABNORMAL HIGH
Non-HDL Cholesterol (Calc): 167 mg/dL (calc) — ABNORMAL HIGH (ref <130)
Total CHOL/HDL Ratio: 3.4 (calc) (ref <5.0)
Triglycerides: 172 mg/dL — ABNORMAL HIGH (ref <150)

## 2020-05-27 LAB — COMPLETE METABOLIC PANEL WITH GFR
AG Ratio: 1.6 (calc) (ref 1.0–2.5)
ALT: 13 U/L (ref 6–29)
AST: 22 U/L (ref 10–35)
Albumin: 4.6 g/dL (ref 3.6–5.1)
Alkaline phosphatase (APISO): 82 U/L (ref 37–153)
BUN/Creatinine Ratio: 16 (calc) (ref 6–22)
BUN: 17 mg/dL (ref 7–25)
CO2: 26 mmol/L (ref 20–32)
Calcium: 10.1 mg/dL (ref 8.6–10.4)
Chloride: 103 mmol/L (ref 98–110)
Creat: 1.08 mg/dL — ABNORMAL HIGH (ref 0.50–0.99)
GFR, Est African American: 62 mL/min/{1.73_m2} (ref >=60)
GFR, Est Non African American: 54 mL/min/{1.73_m2} — ABNORMAL LOW (ref >=60)
Globulin: 2.8 g/dL (calc) (ref 1.9–3.7)
Glucose, Bld: 94 mg/dL (ref 65–99)
Potassium: 4.6 mmol/L (ref 3.5–5.3)
Sodium: 139 mmol/L (ref 135–146)
Total Bilirubin: 0.3 mg/dL (ref 0.2–1.2)
Total Protein: 7.4 g/dL (ref 6.1–8.1)

## 2020-05-27 LAB — TSH: TSH: 1.24 mIU/L (ref 0.40–4.50)

## 2020-06-22 ENCOUNTER — Emergency Department (INDEPENDENT_AMBULATORY_CARE_PROVIDER_SITE_OTHER)
Admission: EM | Admit: 2020-06-22 | Discharge: 2020-06-22 | Disposition: A | Discharge status: Home / Self Care | Payer: Medicare PPO | Source: Home / Self Care | Attending: Family Medicine | Admitting: Family Medicine

## 2020-06-22 ENCOUNTER — Encounter: Payer: Self-pay | Admitting: Emergency Medicine

## 2020-06-22 ENCOUNTER — Telehealth: Payer: Self-pay

## 2020-06-22 DIAGNOSIS — G459 Transient cerebral ischemic attack, unspecified: Secondary | ICD-10-CM | POA: Diagnosis not present

## 2020-06-22 DIAGNOSIS — G43709 Chronic migraine without aura, not intractable, without status migrainosus: Secondary | ICD-10-CM

## 2020-06-22 NOTE — Telephone Encounter (Signed)
Pt was seen at [facility] this morning and dx with chronic migraine and TIA. She said she was told to take a regular dose of metoprolol 50 mg this morning and then to take a 2nd dose this evening, and then tomorrow return to the regular once daily dosing but to change and take it at night instead of this morning. She said that she feels the TIA is due to elevated BP, which is due to stress. She said that her stress will also go up when she has to go to the doctor's office. She has an old Rx for hydroxyzine 25 mg that she was given 02/27/2020 and wants to know if she can take that for the stress along with taking the metoprolol. She states she also found an old Rx of clonazepam 0.5 mg that was last written for her 04/12/2020 and would like to know if that would be better to take than something else to help with her anxiety.   I also told her that she is supposed to have an UC f/up appt with her PCP, and she was agreeable to this, even stating that she knows she needs to see Joy. Call transferred to the front desk for scheduling, in which looking at her appt desk she is schedule to see Joy on 06/28/2020.

## 2020-06-22 NOTE — Discharge Instructions (Signed)
Consider taking blood pressure pill at night for better control

## 2020-06-22 NOTE — ED Provider Notes (Signed)
[facility]    CSN: [id number] Arrival date & time: 06/22/20  4098      History   Chief Complaint Chief Complaint  Patient presents with  . Migraine    HPI Lindsay Moore is a 64 y.o. female.   Patient presents with headache and hypertension.  Had some visual difficulties and right eye were transient in nature.  Also had a little bit of confusion.  She was trying to play a word game on her phone and forgot the meaning of the word rice.  She has had some issues with blood pressure control in recent months.  Currently takes metoprolol.  No other neurologic symptoms and headache and visual impairment has resolved  HPI  Past Medical History:  Diagnosis Date  . AC (acromioclavicular) joint bone spurs   . Biceps tendinitis on right 06/22/2015  . Breast mass 07/11/2013   Ultrasound needed January 2018   . Chronic obstructive pulmonary disease with acute exacerbation (HCC) 03/04/2020  . Closed displaced fracture of proximal phalanx of third toe of right foot 12/30/2019  . Essential hypertension, benign 06/10/2013  . Flexural eczema 11/23/2016  . Health education 03/04/2020  . 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 High blood pressure 05/07/2020  . History of abnormal cervical Pap smear 06/19/2014   Cryotherapy was required in the mid 1990s   . History of colonic polyps 06/19/2013   Dr. 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   Marland Kitchen Hyperlipidemia 06/10/2013  . Lumbar strain 01/06/2014  . Mild mitral valve regurgitation 05/07/2020  . 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 Palpitations 03/17/2020  . Postmenopausal bleeding 08/17/2014  . Primary osteoarthritis of both knees 08/31/2016  . Right knee pain 06/10/2013   Workers Comp: [facility] Dr. Luiz Blare   . Septal defect 06/10/2013  . Serous otitis media 01/06/2015  . Situational anxiety 06/10/2013  . TIA (transient ischemic attack) 06/10/2013    Patient Active Problem List    Diagnosis Date Noted  . Heart defect   . AC (acromioclavicular) joint bone spurs 05/07/2020  . High blood pressure 05/07/2020  . Mild mitral valve regurgitation 05/07/2020  . Palpitations 03/17/2020  . Chronic obstructive pulmonary disease with acute exacerbation (HCC) 03/04/2020  . Health education 03/04/2020  . Closed displaced fracture of proximal phalanx of third toe of right foot 12/30/2019  . Flexural eczema 11/23/2016  . Primary osteoarthritis of both knees 08/31/2016  . Biceps tendinitis on right 06/22/2015  . Serous otitis media 01/06/2015  . Postmenopausal bleeding 08/17/2014  . History of abnormal cervical Pap smear 06/19/2014  . Lumbar strain 01/06/2014  . Breast mass 07/11/2013  . History of colonic polyps 06/19/2013  . TIA (transient ischemic attack) 06/10/2013  . Essential hypertension, benign 06/10/2013  . Septal defect 06/10/2013  . 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 Situational anxiety 06/10/2013  . Hyperlipidemia 06/10/2013  . Right knee pain 06/10/2013    Past Surgical History:  Procedure Laterality Date  . bicep surgery Right   . broken hip  2000  . FOOT TENDON SURGERY  2000  . GALLBLADDER SURGERY    . orthoscopic knee surgery  10/2012  . TONSILLECTOMY  1959  . TUBAL LIGATION  1988    OB History    Gravida  6   Para  5   Term      Preterm  2   AB      Living  5  SAB      IAB      Ectopic      Multiple      Live Births               Home Medications    Prior to Admission medications   Medication Sig Start Date End Date Taking? Authorizing Provider  aspirin EC 81 MG tablet Take 1 tablet (81 mg total) by mouth daily. 06/10/13  Yes Hommel, Sean, DO  Calcium Carb-Cholecalciferol (CALCIUM + D3 PO) Take by mouth.   Yes [provider]  MEGARED OMEGA-3 KRILL OIL PO Take by mouth.   Yes [provider]  Multiple Vitamin (MULTIVITAMIN) tablet Take 1 tablet by mouth daily.   Yes [provider]   triamcinolone ointment (KENALOG) 0.1 % Apply 1 application topically 2 (two) times daily. To affected areas 05/26/19  Yes Sunnie Nielsen, DO  acetaminophen (TYLENOL) 650 MG CR tablet Take 1 tablet (650 mg total) by mouth every 8 (eight) hours as needed for pain. 12/07/16   Monica Becton, MD  albuterol (VENTOLIN HFA) 108 (90 Base) MCG/ACT inhaler Inhale 1-2 puffs into the lungs every 6 (six) hours as needed for wheezing or shortness of breath. 02/27/20   Lurene Shadow, PA-C  metoprolol succinate (TOPROL-XL) 50 MG 24 hr tablet Take 1 tablet (50 mg total) by mouth daily. Take with or immediately following a meal. 03/17/20 06/15/20  Revankar, Aundra Dubin, MD  potassium chloride (KLOR-CON) 10 MEQ tablet Take 10 mEq by mouth daily.     [provider]    Family History Family History  Problem Relation Age of Onset  . Alcoholism Father   . Lung cancer Father   . Diabetes Other        grandmother  . Hyperlipidemia Mother   . Hypertension Mother   . Stroke Other        grandmother    Social History Social History   Tobacco Use  . Smoking status: Former Smoker    Packs/day: 1.00    Years: 25.00    Pack years: 25.00    Types: Cigarettes    Quit date: 07/03/1997    Years since quitting: 22.9  . Smokeless tobacco: Never Used  Vaping Use  . Vaping Use: Never used  Substance Use Topics  . Alcohol use: Not Currently  . Drug use: No     Allergies   Amlodipine, Bee venom, Lisinopril, and Ibuprofen   Review of Systems Review of Systems  Eyes: Positive for visual disturbance.  Neurological: Positive for headaches.  All other systems reviewed and are negative.    Physical Exam Triage Vital Signs ED Triage Vitals  Enc Vitals Group     BP 06/22/20 0928 (!) 187/110     Pulse Rate 06/22/20 0928 91     Resp 06/22/20 0928 17     Temp 06/22/20 0928 97.7 F (36.5 C)     Temp Source 06/22/20 0928 Oral     SpO2 06/22/20 0928 98 %     Weight 06/22/20 0932 152 lb (68.9  kg)     Height 06/22/20 0932 5\' 4"  (1.626 m)     Head Circumference --      Peak Flow --      Pain Score 06/22/20 0931 0     Pain Loc --      Pain Edu? --      Excl. in GC? --    No data found.  Updated Vital Signs  BP (!) 171/88 (BP Location: Left Arm)   Pulse 90   Temp 97.7 F (36.5 C) (Oral)   Resp 17   Ht 5\' 4"  (1.626 m)   Wt 68.9 kg   SpO2 98%   BMI 26.09 kg/m   Visual Acuity Right Eye Distance:   Left Eye Distance:   Bilateral Distance:    Right Eye Near:   Left Eye Near:    Bilateral Near:     Physical Exam Vitals and nursing note reviewed.  Constitutional:      Appearance: Normal appearance.  HENT:     Head: Normocephalic.  Eyes:     Pupils: Pupils are equal, round, and reactive to light.  Cardiovascular:     Rate and Rhythm: Normal rate and regular rhythm.  Pulmonary:     Effort: Pulmonary effort is normal.     Breath sounds: Normal breath sounds.  Musculoskeletal:     Cervical back: Normal range of motion.  Neurological:     General: No focal deficit present.     Mental Status: She is alert and oriented to person, place, and time.     Cranial Nerves: No cranial nerve deficit.     Motor: No weakness.     Gait: Gait normal.     Deep Tendon Reflexes: Reflexes normal.  Psychiatric:        Mood and Affect: Mood normal.     Comments: Seems somewhat anxious      UC Treatments / Results  Labs (all labs ordered are listed, but only abnormal results are displayed) Labs Reviewed - No data to display  EKG   Radiology No results found.  Procedures Procedures (including critical care time)  Medications Ordered in UC Medications - No data to display  Initial Impression / Assessment and Plan / UC Course  I have reviewed the triage vital signs and the nursing notes.  Pertinent labs & imaging results that were available during my care of the patient were reviewed by me and considered in my medical decision making (see chart for details).      Migraine versus TIA. Final Clinical Impressions(s) / UC Diagnoses   Final diagnoses:  Chronic migraine without aura without status migrainosus, not intractable  TIA (transient ischemic attack)     Discharge Instructions     Consider taking blood pressure pill at night for better control   ED Prescriptions    None     PDMP not reviewed this encounter.   , MD 06/22/20 1025

## 2020-06-22 NOTE — ED Triage Notes (Signed)
Ocular migraine this am from [phone] No OTC meds  Unable to "get words out for 25 mins at home" No speech issues here  Has not taken BP meds today  Pt very anxious- remote TIA in past per pt  NO COVID vaccine  COVID in 8/21

## 2020-06-22 NOTE — Telephone Encounter (Signed)
Okay to take the Hydroxyzine for anxiety/stress. Would prefer to avoid use of Klonopin due to risk of dependence. We can talk further about treatment options and UC findings at her follow up appointment.

## 2020-06-23 NOTE — Telephone Encounter (Signed)
LVMTRC (1st attempt)   

## 2020-06-23 NOTE — Telephone Encounter (Signed)
Pt aware of Joy's response. Upcoming appt on 06/28/2020 at 9:10 verified with pt. No further questions or concerns at this time.

## 2020-06-28 ENCOUNTER — Ambulatory Visit (INDEPENDENT_AMBULATORY_CARE_PROVIDER_SITE_OTHER): Payer: Medicare PPO | Admitting: Medical-Surgical

## 2020-06-28 ENCOUNTER — Encounter: Payer: Self-pay | Admitting: Medical-Surgical

## 2020-06-28 VITALS — BP 169/84 | HR 78 | Temp 98.1°F | Ht 64.0 in | Wt 155.0 lb

## 2020-06-28 DIAGNOSIS — R29818 Other symptoms and signs involving the nervous system: Secondary | ICD-10-CM | POA: Diagnosis not present

## 2020-06-28 DIAGNOSIS — I10 Essential (primary) hypertension: Secondary | ICD-10-CM | POA: Diagnosis not present

## 2020-06-28 DIAGNOSIS — N952 Postmenopausal atrophic vaginitis: Secondary | ICD-10-CM

## 2020-06-28 MED ORDER — HYDROXYZINE HCL 10 MG PO TABS: 10.0000 mg | ORAL_TABLET | Freq: Three times a day (TID) | ORAL | 0 refills | Status: DC | PRN

## 2020-06-28 MED ORDER — METOPROLOL TARTRATE 50 MG PO TABS: 50.0000 mg | ORAL_TABLET | Freq: Two times a day (BID) | ORAL | 3 refills | Status: DC

## 2020-06-28 MED ORDER — PREMARIN 0.625 MG/GM VA CREA: TOPICAL_CREAM | VAGINAL | 12 refills | Status: DC

## 2020-06-28 NOTE — Progress Notes (Signed)
Subjective:    CC: UC follow up  HPI: Pleasant 64 year old female presenting for UC follow up. Was seen last week on 12/21 with neurological symptoms suspected to be migraine vs TIA. Patient notes that she had visual deficit in one eye similar to migraine but was doing a word puzzle and didn't recognize the word RICE. These symptoms took approximately 1.5 hours to resolve. At the UC visit her BP was very elevated and she was instructed to take her metoprolol 50mg  twice that day then resume her daily dosing at night. She followed those instructions and has been monitoring her BP at home. Readings at the lowest 135/70s but these are rare. Most readings have been in 160s/80s. No further neuro changes. Denies CP, SOB, palpitations, lower extremity edema, dizziness, headaches, or vision changes.  Anxiety- has tried Hydroxyzine 25mg  and notes that it does reduce her anxiety but makes her a little woozy. Tends to be sensitive to medications.   Atrophic vaginitis- has noted vaginal discomfort and tearing with sexual activity. Used Premarin cream several years ago and it worked well. Would like to try this again.   I reviewed the past medical history, family history, social history, surgical history, and allergies today and no changes were needed.  Please see the problem list section below in epic for further details.  Past Medical History: Past Medical History:  Diagnosis Date  . AC (acromioclavicular) joint bone spurs   . Biceps tendinitis on right 06/22/2015  . Breast mass 07/11/2013   Ultrasound needed January 2018   . Chronic obstructive pulmonary disease with acute exacerbation (HCC) 03/04/2020  . Closed displaced fracture of proximal phalanx of third toe of right foot 12/30/2019  . Essential hypertension, benign 06/10/2013  . Flexural eczema 11/23/2016  . Health education 03/04/2020  . 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 High blood pressure 05/07/2020  . History of abnormal cervical Pap smear  06/19/2014   Cryotherapy was required in the mid 1990s   . History of colonic polyps 06/19/2013   Dr. 06/21/2014: 12/12//2011 Single Tubulovillous Adenoma Repeat in 2016 showed the same, repeat recommended 2021.   2017 Hyperlipidemia 06/10/2013  . Lumbar strain 01/06/2014  . Mild mitral valve regurgitation 05/07/2020  . 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 Palpitations 03/17/2020  . Postmenopausal bleeding 08/17/2014  . Primary osteoarthritis of both knees 08/31/2016  . Right knee pain 06/10/2013   Workers Comp: [facility] Dr. 10/31/2016   . Septal defect 06/10/2013  . Serous otitis media 01/06/2015  . Situational anxiety 06/10/2013  . TIA (transient ischemic attack) 06/10/2013   Past Surgical History: Past Surgical History:  Procedure Laterality Date  . bicep surgery Right   . broken hip  2000  . FOOT TENDON SURGERY  2000  . GALLBLADDER SURGERY    . orthoscopic knee surgery  10/2012  . TONSILLECTOMY  1959  . TUBAL LIGATION  1988   Social History: Social History   Socioeconomic History  . Marital status: Married    Spouse name: Not on file  . Number of children: Not on file  . Years of education: Not on file  . Highest education level: Not on file  Occupational History  . Occupation: 2001  Tobacco Use  . Smoking status: Former Smoker    Packs/day: 1.00    Years: 25.00    Pack years: 25.00    Types: Cigarettes    Quit date: 07/03/1997    Years since quitting: 23.0  . Smokeless tobacco:  Never Used  Vaping Use  . Vaping Use: Never used  Substance and Sexual Activity  . Alcohol use: Not Currently  . Drug use: No  . Sexual activity: Not on file  Other Topics Concern  . Not on file  Social History Narrative  . Not on file   Social Determinants of Health   Financial Resource Strain: Not on file  Food Insecurity: Not on file  Transportation Needs: Not on file  Physical Activity: Not on file  Stress: Not on file  Social Connections: Not on file   Family History: Family History   Problem Relation Age of Onset  . Alcoholism Father   . Lung cancer Father   . Diabetes Other        grandmother  . Hyperlipidemia Mother   . Hypertension Mother   . Stroke Other        grandmother   Allergies: Allergies  Allergen Reactions  . Amlodipine Other (See Comments)    edema edema  . Bee Venom Other (See Comments)  . Lisinopril Other (See Comments)    tongue swelling tongue swelling  . Ibuprofen     REACTION: breathing difficulty   Medications: See med rec.  Review of Systems: See HPI for pertinent positives and negatives.   Objective:    General: Well Developed, well nourished, and in no acute distress.  Neuro: Alert and oriented x3.  HEENT: Normocephalic, atraumatic.  Skin: Warm and dry. Cardiac: Regular rate and rhythm, no murmurs rubs or gallops, no lower extremity edema.  Respiratory: Clear to auscultation bilaterally. Not using accessory muscles, speaking in full sentences.  Impression and Recommendations:    1. Neurological finding History of TIA 10-12 years ago. Symptoms suspicious for another TIA. No imaging done at [facility]. Getting MRA Head w/o contrast. Continue ASA 81mg  daily. Consider starting statin.   2. Essential hypertension, benign Changing from Toprol XL daily to Metoprolol 50mg  BID. Monitor BP at home with goal of 130/80. If consistently above, will need to adjust dosing.   3. Vaginal atrophy Premarin cream daily x 14 days then reduce to twice weekly.   Return in about 2 weeks (around 07/12/2020) for HTN/pap smear. ___________________________________________ , DNP, APRN, FNP-BC Primary Care and Sports Medicine [facility] [place]

## 2020-07-01 ENCOUNTER — Encounter: Payer: Self-pay | Admitting: Medical-Surgical

## 2020-07-01 MED ORDER — DIAZEPAM 5 MG PO TABS: ORAL_TABLET | ORAL | 0 refills | Status: DC

## 2020-07-04 ENCOUNTER — Ambulatory Visit (INDEPENDENT_AMBULATORY_CARE_PROVIDER_SITE_OTHER): Payer: Medicare PPO

## 2020-07-04 ENCOUNTER — Other Ambulatory Visit: Payer: Self-pay | Admitting: Medical-Surgical

## 2020-07-04 ENCOUNTER — Other Ambulatory Visit: Payer: Self-pay

## 2020-07-04 DIAGNOSIS — H547 Unspecified visual loss: Secondary | ICD-10-CM

## 2020-07-04 DIAGNOSIS — R29818 Other symptoms and signs involving the nervous system: Secondary | ICD-10-CM

## 2020-07-04 DIAGNOSIS — Z1389 Encounter for screening for other disorder: Secondary | ICD-10-CM

## 2020-07-04 DIAGNOSIS — G459 Transient cerebral ischemic attack, unspecified: Secondary | ICD-10-CM | POA: Diagnosis not present

## 2020-07-08 DIAGNOSIS — H43313 Vitreous membranes and strands, bilateral: Secondary | ICD-10-CM | POA: Diagnosis not present

## 2020-07-08 DIAGNOSIS — H524 Presbyopia: Secondary | ICD-10-CM | POA: Diagnosis not present

## 2020-07-12 ENCOUNTER — Other Ambulatory Visit (HOSPITAL_COMMUNITY)
Admission: RE | Admit: 2020-07-12 | Discharge: 2020-07-12 | Disposition: A | Discharge status: Home / Self Care | Payer: Medicare PPO | Source: Ambulatory Visit | Attending: Medical-Surgical | Admitting: Medical-Surgical

## 2020-07-12 ENCOUNTER — Ambulatory Visit (INDEPENDENT_AMBULATORY_CARE_PROVIDER_SITE_OTHER): Payer: Medicare PPO | Admitting: Medical-Surgical

## 2020-07-12 ENCOUNTER — Other Ambulatory Visit: Payer: Self-pay

## 2020-07-12 ENCOUNTER — Encounter: Payer: Self-pay | Admitting: Medical-Surgical

## 2020-07-12 VITALS — BP 151/83 | HR 74 | Temp 98.5°F | Ht 64.0 in | Wt 154.6 lb

## 2020-07-12 DIAGNOSIS — I10 Essential (primary) hypertension: Secondary | ICD-10-CM | POA: Diagnosis not present

## 2020-07-12 DIAGNOSIS — Z124 Encounter for screening for malignant neoplasm of cervix: Secondary | ICD-10-CM

## 2020-07-12 DIAGNOSIS — Z1151 Encounter for screening for human papillomavirus (HPV): Secondary | ICD-10-CM | POA: Insufficient documentation

## 2020-07-12 NOTE — Progress Notes (Signed)
Subjective:    CC: Pap smear, hypertension follow-up  HPI: Pleasant 65 year old female presenting for hypertension follow-up and to have her Pap smear completed.  Has been monitoring her blood pressure at home and brought her readings with her to the office today.  Approximately 2 weeks ago, we switched her Toprol-XL to metoprolol 50 mg twice daily.  She has been taking this, tolerating well without side effects.  Home blood pressure readings from 101- 155/70s-80s with more than half of the readings at goal.  Notes that some of her readings were taken after activity such as walking the dog or housework. Denies CP, SOB, palpitations, lower extremity edema, dizziness, headaches, or vision changes.  I reviewed the past medical history, family history, social history, surgical history, and allergies today and no changes were needed.  Please see the problem list section below in epic for further details.  Past Medical History: Past Medical History:  Diagnosis Date  . AC (acromioclavicular) joint bone spurs   . Biceps tendinitis on right 06/22/2015  . Breast mass 07/11/2013   Ultrasound needed January 2018   . Chronic obstructive pulmonary disease with acute exacerbation (HCC) 03/04/2020  . Closed displaced fracture of proximal phalanx of third toe of right foot 12/30/2019  . Essential hypertension, benign 06/10/2013  . Flexural eczema 11/23/2016  . Health education 03/04/2020  . 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 High blood pressure 05/07/2020  . History of abnormal cervical Pap smear 06/19/2014   Cryotherapy was required in the mid 1990s   . History of colonic polyps 06/19/2013   Dr. 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   Marland Kitchen Hyperlipidemia 06/10/2013  . Lumbar strain 01/06/2014  . Mild mitral valve regurgitation 05/07/2020  . 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 Palpitations 03/17/2020  . Postmenopausal bleeding 08/17/2014  . Primary  osteoarthritis of both knees 08/31/2016  . Right knee pain 06/10/2013   Workers Comp: [facility] Dr. Luiz Blare   . Septal defect 06/10/2013  . Serous otitis media 01/06/2015  . Situational anxiety 06/10/2013  . TIA (transient ischemic attack) 06/10/2013   Past Surgical History: Past Surgical History:  Procedure Laterality Date  . bicep surgery Right   . broken hip  2000  . FOOT TENDON SURGERY  2000  . GALLBLADDER SURGERY    . orthoscopic knee surgery  10/2012  . TONSILLECTOMY  1959  . TUBAL LIGATION  1988   Social History: Social History   Socioeconomic History  . Marital status: Married    Spouse name: Not on file  . Number of children: Not on file  . Years of education: Not on file  . Highest education level: Not on file  Occupational History  . Occupation: Runner, broadcasting/film/video  Tobacco Use  . Smoking status: Former Smoker    Packs/day: 1.00    Years: 25.00    Pack years: 25.00    Types: Cigarettes    Quit date: 07/03/1997    Years since quitting: 23.0  . Smokeless tobacco: Never Used  Vaping Use  . Vaping Use: Never used  Substance and Sexual Activity  . Alcohol use: Not Currently  . Drug use: No  . Sexual activity: Not on file  Other Topics Concern  . Not on file  Social History Narrative  . Not on file   Social Determinants of Health   Financial Resource Strain: Not on file  Food Insecurity: Not on file  Transportation Needs: Not  on file  Physical Activity: Not on file  Stress: Not on file  Social Connections: Not on file   Family History: Family History  Problem Relation Age of Onset  . Alcoholism Father   . Lung cancer Father   . Diabetes Other        grandmother  . Hyperlipidemia Mother   . Hypertension Mother   . Stroke Other        grandmother   Allergies: Allergies  Allergen Reactions  . Amlodipine Other (See Comments)    edema edema  . Bee Venom Other (See Comments)  . Lisinopril Other (See Comments)    tongue swelling tongue swelling  .  Ibuprofen     REACTION: breathing difficulty   Medications: See med rec.  Review of Systems: See HPI for pertinent positives and negatives.   Objective:    General: Well Developed, well nourished, and in no acute distress.  Neuro: Alert and oriented x3.  HEENT: Normocephalic, atraumatic.  Skin: Warm and dry. Cardiac: Regular rate and rhythm, no murmurs rubs or gallops, no lower extremity edema.  Respiratory: Clear to auscultation bilaterally. Not using accessory muscles, speaking in full sentences. Pelvic exam: normal external genitalia, vulva, vagina, cervix, uterus and adnexa, VULVA: normal appearing vulva with no masses, tenderness or lesions, vulvar erythema to bilateral labia majora, VAGINA: atrophic, CERVIX: normal appearing cervix without discharge or lesions, cervical motion tenderness absent, multiparous os, UTERUS: uterus is normal size, shape, consistency and nontender, ADNEXA: normal adnexa in size, nontender and no masses, PAP: Pap smear done today, HPV test, exam chaperoned by Ihor Gully, MA.  Impression and Recommendations:    1. Essential hypertension, benign Majority of her home readings are at goal so continue metoprolol 50 mg twice daily as prescribed.  2. Cervical cancer screening Pap smear with HPV cotesting completed today. - Cytology - PAP  Return in about 3 months (around 10/10/2020) for HTN follow up. ___________________________________________ Thayer Ohm, DNP, APRN, FNP-BC Primary Care and Sports Medicine [facility] [place]

## 2020-07-14 ENCOUNTER — Other Ambulatory Visit: Payer: Self-pay | Admitting: Medical-Surgical

## 2020-07-14 DIAGNOSIS — Z1231 Encounter for screening mammogram for malignant neoplasm of breast: Secondary | ICD-10-CM

## 2020-07-14 LAB — CYTOLOGY - PAP
Comment: NEGATIVE
Diagnosis: NEGATIVE
High risk HPV: NEGATIVE

## 2020-07-19 ENCOUNTER — Encounter: Payer: Self-pay | Admitting: Medical-Surgical

## 2020-07-26 ENCOUNTER — Other Ambulatory Visit: Payer: Self-pay

## 2020-07-26 ENCOUNTER — Ambulatory Visit
Admission: RE | Admit: 2020-07-26 | Discharge: 2020-07-26 | Disposition: A | Discharge status: Home / Self Care | Payer: Medicare PPO | Source: Ambulatory Visit

## 2020-07-26 DIAGNOSIS — Z1231 Encounter for screening mammogram for malignant neoplasm of breast: Secondary | ICD-10-CM | POA: Diagnosis not present

## 2020-11-24 ENCOUNTER — Ambulatory Visit (INDEPENDENT_AMBULATORY_CARE_PROVIDER_SITE_OTHER): Payer: Medicare PPO | Admitting: Medical-Surgical

## 2020-11-24 ENCOUNTER — Other Ambulatory Visit: Payer: Self-pay

## 2020-11-24 ENCOUNTER — Ambulatory Visit (INDEPENDENT_AMBULATORY_CARE_PROVIDER_SITE_OTHER): Payer: Medicare PPO

## 2020-11-24 ENCOUNTER — Encounter: Payer: Self-pay | Admitting: Medical-Surgical

## 2020-11-24 VITALS — BP 147/89 | HR 82 | Temp 98.0°F | Ht 64.5 in | Wt 160.8 lb

## 2020-11-24 DIAGNOSIS — Z0000 Encounter for general adult medical examination without abnormal findings: Secondary | ICD-10-CM

## 2020-11-24 DIAGNOSIS — E785 Hyperlipidemia, unspecified: Secondary | ICD-10-CM | POA: Diagnosis not present

## 2020-11-24 DIAGNOSIS — Z1329 Encounter for screening for other suspected endocrine disorder: Secondary | ICD-10-CM

## 2020-11-24 DIAGNOSIS — M79671 Pain in right foot: Secondary | ICD-10-CM

## 2020-11-24 DIAGNOSIS — Z1159 Encounter for screening for other viral diseases: Secondary | ICD-10-CM | POA: Diagnosis not present

## 2020-11-24 DIAGNOSIS — Z114 Encounter for screening for human immunodeficiency virus [HIV]: Secondary | ICD-10-CM

## 2020-11-24 MED ORDER — PREMARIN 0.625 MG/GM VA CREA: TOPICAL_CREAM | VAGINAL | 12 refills | Status: DC

## 2020-11-24 NOTE — Patient Instructions (Signed)
Preventive Care 66 Years and Older, Female Preventive care refers to lifestyle choices and visits with your health care provider that can promote health and wellness. This includes:  A yearly physical exam. This is also called an annual wellness visit.  Regular dental and eye exams.  Immunizations.  Screening for certain conditions.  Healthy lifestyle choices, such as: ? Eating a healthy diet. ? Getting regular exercise. ? Not using drugs or products that contain nicotine and tobacco. ? Limiting alcohol use. What can I expect for my preventive care visit? Physical exam Your health care provider will check your:  Height and weight. These may be used to calculate your BMI (body mass index). BMI is a measurement that tells if you are at a healthy weight.  Heart rate and blood pressure.  Body temperature.  Skin for abnormal spots. Counseling Your health care provider may ask you questions about your:  Past medical problems.  Family's medical history.  Alcohol, tobacco, and drug use.  Emotional well-being.  Home life and relationship well-being.  Sexual activity.  Diet, exercise, and sleep habits.  History of falls.  Memory and ability to understand (cognition).  Work and work Statistician.  Pregnancy and menstrual history.  Access to firearms. What immunizations do I need? Vaccines are usually given at various ages, according to a schedule. Your health care provider will recommend vaccines for you based on your age, medical history, and lifestyle or other factors, such as travel or where you work.   What tests do I need? Blood tests  Lipid and cholesterol levels. These may be checked every 5 years, or more often depending on your overall health.  Hepatitis C test.  Hepatitis B test. Screening  Lung cancer screening. You may have this screening every year starting at age 66 if you have a 30-pack-year history of smoking and currently smoke or have quit within  the past 15 years.  Colorectal cancer screening. ? All adults should have this screening starting at age 66 and continuing until age 66. ? Your health care provider may recommend screening at age 66 if you are at increased risk. ? You will have tests every 1-10 years, depending on your results and the type of screening test.  Diabetes screening. ? This is done by checking your blood sugar (glucose) after you have not eaten for a while (fasting). ? You may have this done every 1-3 years.  Mammogram. ? This may be done every 1-2 years. ? Talk with your health care provider about how often you should have regular mammograms.  Abdominal aortic aneurysm (AAA) screening. You may need this if you are a current or former smoker.  BRCA-related cancer screening. This may be done if you have a family history of breast, ovarian, tubal, or peritoneal cancers. Other tests  STD (sexually transmitted disease) testing, if you are at risk.  Bone density scan. This is done to screen for osteoporosis. You may have this done starting at age 66. Talk with your health care provider about your test results, treatment options, and if necessary, the need for more tests. Follow these instructions at home: Eating and drinking  Eat a diet that includes fresh fruits and vegetables, whole grains, lean protein, and low-fat dairy products. Limit your intake of foods with high amounts of sugar, saturated fats, and salt.  Take vitamin and mineral supplements as recommended by your health care provider.  Do not drink alcohol if your health care provider tells you not to drink.  If you drink alcohol: ? Limit how much you have to 0-1 drink a day. ? Be aware of how much alcohol is in your drink. In the [place] one drink equals one 12 oz bottle of beer (355 mL), one 5 oz glass of wine (148 mL), or one 1 oz glass of hard liquor (44 mL).   Lifestyle  Take daily care of your teeth and gums. Brush your teeth every morning  and night with fluoride toothpaste. Floss one time each day.  Stay active. Exercise for at least 30 minutes 5 or more days each week.  Do not use any products that contain nicotine or tobacco, such as cigarettes, e-cigarettes, and chewing tobacco. If you need help quitting, ask your health care provider.  Do not use drugs.  If you are sexually active, practice safe sex. Use a condom or other form of protection in order to prevent STIs (sexually transmitted infections).  Talk with your health care provider about taking a low-dose aspirin or statin.  Find healthy ways to cope with stress, such as: ? Meditation, yoga, or listening to music. ? Journaling. ? Talking to a trusted person. ? Spending time with friends and family. Safety  Always wear your seat belt while driving or riding in a vehicle.  Do not drive: ? If you have been drinking alcohol. Do not ride with someone who has been drinking. ? When you are tired or distracted. ? While texting.  Wear a helmet and other protective equipment during sports activities.  If you have firearms in your house, make sure you follow all gun safety procedures. What's next?  Visit your health care provider once a year for an annual wellness visit.  Ask your health care provider how often you should have your eyes and teeth checked.  Stay up to date on all vaccines. This information is not intended to replace advice given to you by your health care provider. Make sure you discuss any questions you have with your health care provider. Document Revised: 06/09/2020 Document Reviewed: 06/13/2018 Elsevier Patient Education  2021 [employer] Inc.  

## 2020-11-24 NOTE — Progress Notes (Signed)
HPI: Lindsay Moore is a 65 y.o. female who  has a past medical history of AC (acromioclavicular) joint bone spurs, Biceps tendinitis on right (06/22/2015), Breast mass (07/11/2013), Chronic obstructive pulmonary disease with acute exacerbation ([place]) (03/04/2020), Closed displaced fracture of proximal phalanx of third toe of right foot (12/30/2019), Essential hypertension, benign (06/10/2013), Flexural eczema (11/23/2016), Health education (03/04/2020), Heart defect, High blood pressure (05/07/2020), History of abnormal cervical Pap smear (06/19/2014), History of colonic polyps (06/19/2013), Hyperlipidemia (06/10/2013), Lumbar strain (01/06/2014), Mild mitral valve regurgitation (05/07/2020), 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  she [facility] to [facility], 11/24/20,  for chief complaint of: Annual physical exam  Dentist: dentures on top, fear of dental care Eye exam: UTD, 08/2020 Exercise: walking at times Diet: olive oil every morning, working to increase veggies; room for improvement Pap smear: UTD Mammogram: UTD Colon cancer screening: UTD COVID vaccine: None  Concerns: Right foot pain  Past medical, surgical, social and family history reviewed:  Patient Active Problem List   Diagnosis Date Noted  . Heart defect   . AC (acromioclavicular) joint bone spurs 05/07/2020  . High blood pressure 05/07/2020  . Mild mitral valve regurgitation 05/07/2020  . Palpitations 03/17/2020  . Chronic obstructive pulmonary disease with acute exacerbation ([place]) 03/04/2020  . Health education 03/04/2020  . Closed displaced fracture of proximal phalanx of third toe of right foot 12/30/2019  .  Flexural eczema 11/23/2016  . Primary osteoarthritis of both knees 08/31/2016  . Biceps tendinitis on right 06/22/2015  . Serous otitis media 01/06/2015  . Postmenopausal bleeding 08/17/2014  . History of abnormal cervical Pap smear 06/19/2014  . Lumbar strain 01/06/2014  . Breast mass 07/11/2013  . Encounter for colonoscopy due to history of colonic polyp 06/19/2013  . TIA (transient ischemic attack) 06/10/2013  . Essential hypertension, benign 06/10/2013  . Septal defect 06/10/2013  . 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 Situational anxiety 06/10/2013  . Hyperlipidemia 06/10/2013  . Right knee pain 06/10/2013    Past Surgical History:  Procedure Laterality Date  . bicep surgery Right   . broken hip  2000  . FOOT TENDON SURGERY  2000  . GALLBLADDER SURGERY    . orthoscopic knee surgery  10/2012  . TONSILLECTOMY  1959  . TUBAL LIGATION  1988    Social History   Tobacco Use  . Smoking status: Former Smoker    Packs/day: 1.00    Years: 25.00    Pack years: 25.00    Types: Cigarettes    Quit date: 07/03/1997    Years since quitting: 23.4  . Smokeless tobacco: Never Used  Substance Use Topics  . Alcohol use: Not Currently    Family History  Problem Relation Age of Onset  . Alcoholism Father   . Lung cancer Father   . Diabetes Other        grandmother  . Hyperlipidemia Mother   . Hypertension Mother   . Stroke Other        grandmother     Current medication list and allergy/intolerance information reviewed:    Current Outpatient Medications  Medication Sig Dispense Refill  . acetaminophen (TYLENOL) 650 MG CR tablet Take 1 tablet (650 mg total) by mouth every 8 (eight) hours as needed for pain. 90 tablet 3  . albuterol (VENTOLIN HFA) 108 (  90 Base) MCG/ACT inhaler Inhale 1-2 puffs into the lungs every 6 (six) hours as needed for wheezing or shortness of breath. 1 each 0  . aspirin EC 81 MG tablet Take 1 tablet (81 mg total) by mouth daily. 150 tablet 2  . Biotin w/  Vitamins C & E (HAIR/SKIN/NAILS PO) Take by mouth daily.    . Calcium Carb-Cholecalciferol (CALCIUM + D3 PO) Take by mouth.    . conjugated estrogens (PREMARIN) vaginal cream Use daily for 14 days then reduce to twice weekly. 30 g 12  . diazepam (VALIUM) 5 MG tablet 1 tab 2 hours before procedure or imaging, take another tab 30 minutes to 1 hour before if not feeling relaxed (do not drive on this med) 2 tablet 0  . hydrOXYzine (ATARAX/VISTARIL) 10 MG tablet Take 1 tablet (10 mg total) by mouth 3 (three) times daily as needed. 30 tablet 0  . MEGARED OMEGA-3 KRILL OIL PO Take by mouth.    . metoprolol tartrate (LOPRESSOR) 50 MG tablet Take 1 tablet (50 mg total) by mouth 2 (two) times daily. 180 tablet 3  . Multiple Vitamin (MULTIVITAMIN) tablet Take 1 tablet by mouth daily.    . potassium chloride (KLOR-CON) 10 MEQ tablet Take 10 mEq by mouth daily.     . RESTASIS 0.05 % ophthalmic emulsion 1 drop 2 (two) times daily.    Marland Kitchen triamcinolone ointment (KENALOG) 0.1 % Apply 1 application topically 2 (two) times daily. To affected areas 60 g 6   No current facility-administered medications for this visit.    Allergies  Allergen Reactions  . Ibuprofen Shortness Of Breath  . Amlodipine Swelling  . Bee Venom Other (See Comments)  . Lisinopril Swelling    Review of Systems:  Constitutional:  No  fever, no chills, No recent illness, No unintentional weight changes. No significant fatigue.   HEENT: No  headache, no vision change, no hearing change, No sore throat, No  sinus pressure  Cardiac: No  chest pain, No  pressure, No palpitations, No  Orthopnea  Respiratory:  No  shortness of breath. No  Cough  Gastrointestinal: No  abdominal pain, No  nausea, No  vomiting,  No  blood in stool, No  diarrhea, No  constipation   Musculoskeletal: No new myalgia/arthralgia  Skin: No  Rash, No other wounds/concerning lesions  Genitourinary: No  incontinence, No  abnormal genital bleeding, No abnormal  genital discharge  Hem/Onc: No  easy bruising/bleeding, No  abnormal lymph node  Endocrine: No cold intolerance,  No heat intolerance. No polyuria/polydipsia/polyphagia   Neurologic: No  weakness, No  dizziness, No  slurred speech/focal weakness/facial droop  Psychiatric: No  concerns with depression, No  concerns with anxiety, No sleep problems, No mood problems  Exam:  BP (!) 147/89   Pulse 82   Temp 98 F (36.7 C)   Ht 5' 4.5" (1.638 m)   Wt 160 lb 12.8 oz (72.9 kg)   SpO2 98%   BMI 27.17 kg/m   Constitutional: VS see above. General Appearance: alert, well-developed, well-nourished, NAD  Eyes: Normal lids and conjunctive, non-icteric sclera  Ears, Nose, Mouth, Throat: MMM, Normal external inspection ears/nares/mouth/lips/gums. TM normal bilaterally.   Neck: No masses, trachea midline. No thyroid enlargement. No tenderness/mass appreciated. No lymphadenopathy  Respiratory: Normal respiratory effort. no wheeze, no rhonchi, no rales  Cardiovascular: S1/S2 normal, no murmur, no rub/gallop auscultated. RRR. No lower extremity edema. No carotid bruit or JVD. No abdominal aortic bruit.  Gastrointestinal: Nontender, no masses.  No hepatomegaly, no splenomegaly. No hernia appreciated. Bowel sounds normal. Rectal exam deferred.   Musculoskeletal: Gait normal. No clubbing/cyanosis of digits.   Neurological: Normal balance/coordination. No tremor. No cranial nerve deficit on limited exam. Motor and sensation intact and symmetric. Cerebellar reflexes intact.   Skin: warm, dry, intact. No rash/ulcer. No concerning nevi or subq nodules on limited exam.    Psychiatric: Normal judgment/insight. Normal mood and affect. Oriented x3.    No results found for this or any previous visit (from the past 72 hour(s)).  No results found.   ASSESSMENT/PLAN:   1. Annual physical exam Checking CBC with differential, CMP, and lipid panel today. - CBC with Differential/Platelet - COMPLETE  METABOLIC PANEL WITH GFR - Lipid panel  2. Encounter for screening for HIV 3. Need for hepatitis C screening test Discussed screening recommendations.  Patient is agreeable so we will add to blood work today. - HIV Antibody (routine testing w rflx) - Hepatitis C antibody  4. Screening for endocrine disorder Checking TSH today. - TSH  5. Right foot pain Getting right foot x-rays today.  Suspect this is related to more of an arthritis issue. - DG Foot Complete Right; Future  Orders Placed This Encounter  Procedures  . DG Foot Complete Right  . CBC with Differential/Platelet  . COMPLETE METABOLIC PANEL WITH GFR  . Lipid panel  . TSH  . HIV Antibody (routine testing w rflx)  . Hepatitis C antibody    Meds ordered this encounter  Medications  . conjugated estrogens (PREMARIN) vaginal cream    Sig: Use daily for 14 days then reduce to twice weekly.    Dispense:  30 g    Refill:  12    Order Specific Question:   Supervising Provider    Answer:   Emeterio Reeve [phone]    Patient Instructions   Preventive Care 74 Years and Older, Female Preventive care refers to lifestyle choices and visits with your health care provider that can promote health and wellness. This includes:  A yearly physical exam. This is also called an annual wellness visit.  Regular dental and eye exams.  Immunizations.  Screening for certain conditions.  Healthy lifestyle choices, such as: ? Eating a healthy diet. ? Getting regular exercise. ? Not using drugs or products that contain nicotine and tobacco. ? Limiting alcohol use. What can I expect for my preventive care visit? Physical exam Your health care provider will check your:  Height and weight. These may be used to calculate your BMI (body mass index). BMI is a measurement that tells if you are at a healthy weight.  Heart rate and blood pressure.  Body temperature.  Skin for abnormal spots. Counseling Your health care  provider may ask you questions about your:  Past medical problems.  Family's medical history.  Alcohol, tobacco, and drug use.  Emotional well-being.  Home life and relationship well-being.  Sexual activity.  Diet, exercise, and sleep habits.  History of falls.  Memory and ability to understand (cognition).  Work and work Statistician.  Pregnancy and menstrual history.  Access to firearms. What immunizations do I need? Vaccines are usually given at various ages, according to a schedule. Your health care provider will recommend vaccines for you based on your age, medical history, and lifestyle or other factors, such as travel or where you work.   What tests do I need? Blood tests  Lipid and cholesterol levels. These may be checked every 5 years, or more often depending  on your overall health.  Hepatitis C test.  Hepatitis B test. Screening  Lung cancer screening. You may have this screening every year starting at age 63 if you have a 30-pack-year history of smoking and currently smoke or have quit within the past 15 years.  Colorectal cancer screening. ? All adults should have this screening starting at age 37 and continuing until age 70. ? Your health care provider may recommend screening at age 27 if you are at increased risk. ? You will have tests every 1-10 years, depending on your results and the type of screening test.  Diabetes screening. ? This is done by checking your blood sugar (glucose) after you have not eaten for a while (fasting). ? You may have this done every 1-3 years.  Mammogram. ? This may be done every 1-2 years. ? Talk with your health care provider about how often you should have regular mammograms.  Abdominal aortic aneurysm (AAA) screening. You may need this if you are a current or former smoker.  BRCA-related cancer screening. This may be done if you have a family history of breast, ovarian, tubal, or peritoneal cancers. Other  tests  STD (sexually transmitted disease) testing, if you are at risk.  Bone density scan. This is done to screen for osteoporosis. You may have this done starting at age 69. Talk with your health care provider about your test results, treatment options, and if necessary, the need for more tests. Follow these instructions at home: Eating and drinking  Eat a diet that includes fresh fruits and vegetables, whole grains, lean protein, and low-fat dairy products. Limit your intake of foods with high amounts of sugar, saturated fats, and salt.  Take vitamin and mineral supplements as recommended by your health care provider.  Do not drink alcohol if your health care provider tells you not to drink.  If you drink alcohol: ? Limit how much you have to 0-1 drink a day. ? Be aware of how much alcohol is in your drink. In the [place] one drink equals one 12 oz bottle of beer (355 mL), one 5 oz glass of wine (148 mL), or one 1 oz glass of hard liquor (44 mL).   Lifestyle  Take daily care of your teeth and gums. Brush your teeth every morning and night with fluoride toothpaste. Floss one time each day.  Stay active. Exercise for at least 30 minutes 5 or more days each week.  Do not use any products that contain nicotine or tobacco, such as cigarettes, e-cigarettes, and chewing tobacco. If you need help quitting, ask your health care provider.  Do not use drugs.  If you are sexually active, practice safe sex. Use a condom or other form of protection in order to prevent STIs (sexually transmitted infections).  Talk with your health care provider about taking a low-dose aspirin or statin.  Find healthy ways to cope with stress, such as: ? Meditation, yoga, or listening to music. ? Journaling. ? Talking to a trusted person. ? Spending time with friends and family. Safety  Always wear your seat belt while driving or riding in a vehicle.  Do not drive: ? If you have been drinking alcohol. Do  not ride with someone who has been drinking. ? When you are tired or distracted. ? While texting.  Wear a helmet and other protective equipment during sports activities.  If you have firearms in your house, make sure you follow all gun safety procedures. What's next?  Visit  your health care provider once a year for an annual wellness visit.  Ask your health care provider how often you should have your eyes and teeth checked.  Stay up to date on all vaccines. This information is not intended to replace advice given to you by your health care provider. Make sure you discuss any questions you have with your health care provider. Document Revised: 06/09/2020 Document Reviewed: 06/13/2018 Elsevier Patient Education  2021 [place].  Follow-up plan: Return in about 6 months (around 05/27/2021) for HTN follow up.  Clearnce Sorrel, DNP, APRN, FNP-BC [facility]

## 2020-11-25 ENCOUNTER — Encounter: Payer: Self-pay | Admitting: Medical-Surgical

## 2020-11-25 DIAGNOSIS — I2510 Atherosclerotic heart disease of native coronary artery without angina pectoris: Secondary | ICD-10-CM

## 2020-11-25 DIAGNOSIS — E782 Mixed hyperlipidemia: Secondary | ICD-10-CM

## 2020-11-25 LAB — HEPATITIS C ANTIBODY
Hepatitis C Ab: NONREACTIVE
SIGNAL TO CUT-OFF: 0.02 (ref <1.00)

## 2020-11-25 LAB — LIPID PANEL
Cholesterol: 245 mg/dL — ABNORMAL HIGH (ref <200)
HDL: 69 mg/dL (ref >=50)
LDL Cholesterol (Calc): 145 mg/dL (calc) — ABNORMAL HIGH
Non-HDL Cholesterol (Calc): 176 mg/dL (calc) — ABNORMAL HIGH (ref <130)
Total CHOL/HDL Ratio: 3.6 (calc) (ref <5.0)
Triglycerides: 174 mg/dL — ABNORMAL HIGH (ref <150)

## 2020-11-25 LAB — COMPLETE METABOLIC PANEL WITH GFR
AG Ratio: 1.7 (calc) (ref 1.0–2.5)
ALT: 17 U/L (ref 6–29)
AST: 21 U/L (ref 10–35)
Albumin: 4.6 g/dL (ref 3.6–5.1)
Alkaline phosphatase (APISO): 95 U/L (ref 37–153)
BUN/Creatinine Ratio: 20 (calc) (ref 6–22)
BUN: 20 mg/dL (ref 7–25)
CO2: 27 mmol/L (ref 20–32)
Calcium: 9.8 mg/dL (ref 8.6–10.4)
Chloride: 103 mmol/L (ref 98–110)
Creat: 1.01 mg/dL — ABNORMAL HIGH (ref 0.50–0.99)
GFR, Est African American: 67 mL/min/{1.73_m2} (ref >=60)
GFR, Est Non African American: 58 mL/min/{1.73_m2} — ABNORMAL LOW (ref >=60)
Globulin: 2.7 g/dL (calc) (ref 1.9–3.7)
Glucose, Bld: 91 mg/dL (ref 65–99)
Potassium: 4.2 mmol/L (ref 3.5–5.3)
Sodium: 139 mmol/L (ref 135–146)
Total Bilirubin: 0.4 mg/dL (ref 0.2–1.2)
Total Protein: 7.3 g/dL (ref 6.1–8.1)

## 2020-11-25 LAB — CBC WITH DIFFERENTIAL/PLATELET
Absolute Monocytes: 520 cells/uL (ref 200–950)
Basophils Absolute: 64 cells/uL (ref 0–200)
Basophils Relative: 0.8 %
Eosinophils Absolute: 232 cells/uL (ref 15–500)
Eosinophils Relative: 2.9 %
HCT: 45.3 % — ABNORMAL HIGH (ref 35.0–45.0)
Hemoglobin: 15 g/dL (ref 11.7–15.5)
Lymphs Abs: 1816 cells/uL (ref 850–3900)
MCH: 30.2 pg (ref 27.0–33.0)
MCHC: 33.1 g/dL (ref 32.0–36.0)
MCV: 91.3 fL (ref 80.0–100.0)
MPV: 10.7 fL (ref 7.5–12.5)
Monocytes Relative: 6.5 %
Neutro Abs: 5368 cells/uL (ref 1500–7800)
Neutrophils Relative %: 67.1 %
Platelets: 263 10*3/uL (ref 140–400)
RBC: 4.96 10*6/uL (ref 3.80–5.10)
RDW: 12.2 % (ref 11.0–15.0)
Total Lymphocyte: 22.7 %
WBC: 8 10*3/uL (ref 3.8–10.8)

## 2020-11-25 LAB — TSH: TSH: 1.49 mIU/L (ref 0.40–4.50)

## 2020-11-25 LAB — HIV ANTIBODY (ROUTINE TESTING W REFLEX): HIV 1&2 Ab, 4th Generation: NONREACTIVE

## 2020-11-26 ENCOUNTER — Other Ambulatory Visit: Payer: Self-pay | Admitting: *Deleted

## 2020-11-26 ENCOUNTER — Other Ambulatory Visit: Payer: Self-pay | Admitting: Osteopathic Medicine

## 2020-11-26 DIAGNOSIS — E876 Hypokalemia: Secondary | ICD-10-CM

## 2020-11-26 MED ORDER — POTASSIUM CHLORIDE CRYS ER 10 MEQ PO TBCR: 10.0000 meq | EXTENDED_RELEASE_TABLET | Freq: Every day | ORAL | 1 refills | Status: DC

## 2020-11-26 MED ORDER — ATORVASTATIN CALCIUM 10 MG PO TABS: 10.0000 mg | ORAL_TABLET | Freq: Every day | ORAL | 3 refills | Status: DC

## 2020-11-26 NOTE — Telephone Encounter (Signed)
[facility] pharmacy requesting med refill for potassium chloride. Written by historical provider.

## 2020-12-14 ENCOUNTER — Ambulatory Visit (INDEPENDENT_AMBULATORY_CARE_PROVIDER_SITE_OTHER): Payer: Medicare PPO | Admitting: Medical-Surgical

## 2020-12-14 DIAGNOSIS — Z0000 Encounter for general adult medical examination without abnormal findings: Secondary | ICD-10-CM | POA: Diagnosis not present

## 2020-12-14 NOTE — Progress Notes (Signed)
MEDICARE ANNUAL WELLNESS VISIT  12/14/2020  Telephone Visit Disclaimer This Medicare AWV was conducted by telephone due to national recommendations for restrictions regarding the COVID-19 Pandemic (e.g. social distancing).  I verified, using two identifiers, that I am speaking with Lindsay Moore or their authorized healthcare agent. I discussed the limitations, risks, security, and privacy concerns of performing an evaluation and management service by telephone and the potential availability of an in-person appointment in the future. The patient expressed understanding and agreed to proceed.  Location of Patient: Home Location of Provider (nurse):  In the office.  Subjective:    Lindsay Moore is a 65 y.o. female patient of Christen Butter, NP who had a Medicare Annual Wellness Visit today via telephone. Awanda is Retired and lives with their spouse. she has 5 children. she reports that she is socially active and does interact with friends/family regularly. she is moderately physically active and enjoys sewing, bowling, making soap and crafts.  Patient Care Team: Christen Butter, NP as PCP - General (Nurse Practitioner) Laren Boom, DO (Family Medicine)  Advanced Directives 12/14/2020 03/13/2020 11/10/2015 06/25/2015  Does Patient Have a Medical Advance Directive? No No No No  Would patient like information on creating a medical advance directive? No - Patient declined - No - patient declined information No - patient declined information    Hospital Utilization Over the Past 12 Months: # of hospitalizations or ER visits: 2 # of surgeries: 1  Review of Systems    Patient reports that her overall health is unchanged compared to last year.  History obtained from chart review and the patient  Patient Reported Readings (BP, Pulse, CBG, Weight, etc) none  Pain Assessment Pain : No/denies pain     Current Medications & Allergies (verified) Allergies as of 12/14/2020        Reactions   Ibuprofen Shortness Of Breath   Amlodipine Swelling   Bee Venom Other (See Comments)   Lisinopril Swelling        Medication List        Accurate as of December 14, 2020  9:56 AM. If you have any questions, ask your nurse or doctor.          acetaminophen 650 MG CR tablet Commonly known as: TYLENOL Take 1 tablet (650 mg total) by mouth every 8 (eight) hours as needed for pain.   albuterol 108 (90 Base) MCG/ACT inhaler Commonly known as: VENTOLIN HFA Inhale 1-2 puffs into the lungs every 6 (six) hours as needed for wheezing or shortness of breath.   aspirin EC 81 MG tablet Take 1 tablet (81 mg total) by mouth daily.   atorvastatin 10 MG tablet Commonly known as: LIPITOR Take 1 tablet (10 mg total) by mouth daily.   CALCIUM + D3 PO Take by mouth.   diazepam 5 MG tablet Commonly known as: VALIUM 1 tab 2 hours before procedure or imaging, take another tab 30 minutes to 1 hour before if not feeling relaxed (do not drive on this med)   HAIR/SKIN/NAILS PO Take by mouth daily.   hydrOXYzine 10 MG tablet Commonly known as: ATARAX/VISTARIL Take 1 tablet (10 mg total) by mouth 3 (three) times daily as needed.   MEGARED OMEGA-3 KRILL OIL PO Take by mouth.   metoprolol tartrate 50 MG tablet Commonly known as: LOPRESSOR Take 1 tablet (50 mg total) by mouth 2 (two) times daily.   multivitamin tablet Take 1 tablet by mouth daily.   potassium chloride 10 MEQ tablet  Commonly known as: KLOR-CON Take 1 tablet by mouth once daily   Premarin vaginal cream Generic drug: conjugated estrogens Use daily for 14 days then reduce to twice weekly.   Restasis 0.05 % ophthalmic emulsion Generic drug: cycloSPORINE 1 drop 2 (two) times daily.   triamcinolone ointment 0.1 % Commonly known as: KENALOG Apply 1 application topically 2 (two) times daily. 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    Hyperlipidemia 06/10/2013   Lumbar strain 01/06/2014   Mild mitral valve regurgitation 05/07/2020   Ocular migraine 06/10/2013   Palpitations 03/17/2020   Postmenopausal bleeding 08/17/2014   Primary osteoarthritis of both knees 08/31/2016   Right knee pain 06/10/2013   Workers Comp: [facility] Dr. Luiz Blare    Septal defect 06/10/2013   Serous otitis media 01/06/2015   Situational anxiety 06/10/2013   TIA (transient ischemic attack) 06/10/2013   Past Surgical History:  Procedure Laterality Date   bicep surgery Right    broken hip  2000   FOOT TENDON SURGERY  2000   GALLBLADDER SURGERY     orthoscopic knee surgery  10/2012   TONSILLECTOMY  1959   TUBAL LIGATION  1988   Family History  Problem Relation Age of Onset   Alcoholism Father    Lung cancer Father    Diabetes Other        grandmother   Hyperlipidemia Mother    Hypertension Mother    Stroke Other        grandmother   Social History   Socioeconomic History   Marital status: Married    Spouse name: Tinnie Gens   Number of children: 5   Years of education: 14   Highest education level: Some college, no degree  Occupational History   Occupation: Runner, broadcasting/film/video  Tobacco Use   Smoking status: Former    Packs/day: 1.00    Years: 25.00    Pack  years: 25.00    Types: Cigarettes    Quit date: 07/03/1997    Years since quitting: 23.4   Smokeless tobacco: Never  Vaping Use   Vaping Use: Never used  Substance and Sexual Activity   Alcohol use: Not Currently   Drug use: No   Sexual activity: Not on file  Other Topics Concern   Not on file  Social History Narrative   Live with her husband. She enjoys making soap, sewing and doing crafts.   Social Determinants of Health   Financial Resource Strain: Low Risk    Difficulty of Paying Living Expenses: Not hard at all  Food Insecurity: No Food Insecurity   Worried About Programme researcher, broadcasting/film/video in the Last Year: Never true   Ran Out of  Food in the Last Year: Never true  Transportation Needs: No Transportation Needs   Lack of Transportation (Medical): No   Lack of Transportation (Non-Medical): No  Physical Activity: Sufficiently Active   Days of Exercise per Week: 7 days   Minutes of Exercise per Session: 60 min  Stress: No Stress Concern Present   Feeling of Stress : Not at all  Social Connections: Socially Integrated   Frequency of Communication with Friends and Family: More than three times a week   Frequency of Social Gatherings with Friends and Family: Twice a week   Attends Religious Services: More than 4 times per year   Active Member of [employer] or Organizations: Yes   Attends [employer]er[employer] or Organization Meetings: More than 4 times per year   Marital Status: Married    Activities of Daily Living In your present state of health, do you have any difficulty performing the following activities: 12/14/2020 03/16/2020  Hearing? N N  Vision? Y N  Comment eye disease -  Difficulty concentrating or making decisions? N N  Walking or climbing stairs? Y N  Comment climbing stairs due to multiple knee surgeries -  Dressing or bathing? N N  Doing errands, shopping? N N  Preparing Food and eating ? N -  Using the Toilet? N -  In the past six months, have you accidently leaked urine? N -  Do  you have problems with loss of bowel control? N -  Managing your Medications? N -  Managing your Finances? N -  Housekeeping or managing your Housekeeping? N -  Some recent data might be hidden    Patient Education/ Literacy How often do you need to have someone help you when you read instructions, pamphlets, or other written materials from your doctor or pharmacy?: 1 - Never What is the last grade level you completed in school?: 2 years of college.  Exercise Current Exercise Habits: Home exercise routine, Type of exercise: walking;Other - see comments (swimming), Time (Minutes): > 60, Frequency (Times/Week): >7, Weekly Exercise (Minutes/Week): 0, Intensity: Moderate, Exercise limited by: orthopedic condition(s)  Diet Patient reports consuming 3 meals a day and 2 snack(s) a day Patient reports that her primary diet is: Regular Patient reports that she does have regular access to food.   Depression Screen PHQ 2/9 Scores 12/14/2020 11/24/2020 11/24/2020 05/26/2020 04/13/2020 03/16/2020 11/24/2019  PHQ - 2 Score 0 0 0 1 0 1 0  PHQ- 9 Score - 0 - 2 3 6 2      Fall Risk Fall Risk  12/14/2020 11/24/2020 03/16/2020  Falls in the past year? 1 0 1  Number falls in past yr: 1 0 0  Injury with Fall? 1 0 1  Risk for fall due to : Impaired mobility - -  Follow up Falls evaluation completed;Education provided Falls evaluation completed Falls evaluation completed     Objective:  Lindsay AweChristina Goodwin Karl seemed alert and oriented and she participated appropriately during our telephone visit.  Blood Pressure Weight BMI  BP Readings from Last 3 Encounters:  11/24/20 (!) 147/89  07/12/20 (!) 151/83  06/28/20 (!) 169/84   Wt Readings from Last 3 Encounters:  11/24/20 160 lb 12.8 oz (72.9 kg)  07/12/20 154 lb 9.6 oz (70.1 kg)  06/28/20 155 lb (70.3 kg)   BMI Readings from Last 1 Encounters:  11/24/20 27.17 kg/m    *Unable to obtain current vital signs, weight, and BMI due to telephone visit  type  Hearing/Vision  Kanita did not seem  to have difficulty with hearing/understanding during the telephone conversation Reports that she has had a formal eye exam by an eye care professional within the past year Reports that she has not had a formal hearing evaluation within the past year *Unable to fully assess hearing and vision during telephone visit type  Cognitive Function: 6CIT Screen 12/14/2020  What Year? 0 points  What month? 0 points  What time? 0 points  Count back from 20 0 points  Months in reverse 0 points  Repeat phrase 0 points  Total Score 0   (Normal:0-7, Significant for Dysfunction: >8)  Normal Cognitive Function Screening: Yes   Immunization & Health Maintenance Record Immunization History  Administered Date(s) Administered   DTaP 01/31/2009   Influenza Split 05/17/2011, 05/29/2012   Influenza Whole 04/02/2013   Influenza,inj,Quad PF,6+ Mos 03/18/2014, 05/13/2015, 05/10/2016, 04/12/2017, 04/12/2018, 05/26/2019, 05/26/2020   Tdap 10/10/2016   Zoster Recombinat (Shingrix) 10/17/2016, 02/12/2017    Health Maintenance  Topic Date Due   COVID-19 Vaccine (1) 12/30/2020 (Originally 09/30/1960)   PNA vac Low Risk Adult (1 of 2 - PCV13) 03/04/2021 (Originally 09/30/2019)   INFLUENZA VACCINE  01/31/2021   MAMMOGRAM  07/26/2022   TETANUS/TDAP  10/11/2026   COLONOSCOPY (Pts 45-43yrs Insurance coverage will need to be confirmed)  05/08/2027   DEXA SCAN  Completed   Hepatitis C Screening  Completed   Zoster Vaccines- Shingrix  Completed   HPV VACCINES  Aged Out       Assessment  This is a routine wellness examination for [employer].  Health Maintenance: Due or Overdue There are no preventive care reminders to display for this patient.   Abigaile Lorren Rossetti does not need a referral for Community Assistance: Care Management:   no Social Work:    no Prescription Assistance:  no Nutrition/Diabetes Education:  no   Plan:  Personalized  Goals  Goals Addressed               This Visit's Progress     Patient Stated (pt-stated)        12/14/2020 AWV Goal: Exercise for General Health  Patient will verbalize understanding of the benefits of increased physical activity: Exercising regularly is important. It will improve your overall fitness, flexibility, and endurance. Regular exercise also will improve your overall health. It can help you control your weight, reduce stress, and improve your bone density. Over the next year, patient will increase physical activity as tolerated with a goal of at least 150 minutes of moderate physical activity per week.  You can tell that you are exercising at a moderate intensity if your heart starts beating faster and you start breathing faster but can still hold a conversation. Moderate-intensity exercise ideas include: Walking 1 mile (1.6 km) in about 15 minutes Biking Hiking Golfing Dancing Water aerobics Patient will verbalize understanding of everyday activities that increase physical activity by providing examples like the following: Yard work, such as: Insurance underwriter Gardening Washing windows or floors Patient will be able to explain general safety guidelines for exercising:  Before you start a new exercise program, talk with your health care provider. Do not exercise so much that you hurt yourself, feel dizzy, or get very short of breath. Wear comfortable clothes and wear shoes with good support. Drink plenty of water while you exercise to prevent dehydration or heat stroke. Work out until your breathing and your heartbeat get faster.  Personalized Health Maintenance & Screening Recommendations  Pneumococcal vaccine   Lung Cancer Screening Recommended: no (Low Dose CT Chest recommended if Age 39-80 years, 30 pack-year currently smoking OR have quit w/in past 15  years) Hepatitis C Screening recommended: no HIV Screening recommended: no  Advanced Directives: Written information was not prepared per patient's request.  Referrals & Orders No orders of the defined types were placed in this encounter.   Follow-up Plan FollowChristen Butter Jessup, Joy, NP as planned Schedule a nurse visit for your pneumonia vaccine or it can be done in the fall at your next in-office appointment.  Medicare wellness visit in one year.  Patient will access AVS on mychart.   I have personally reviewed and noted the following in the patient's chart:   Medical and social history Use of alcohol, tobacco or illicit drugs  Current medications and supplements Functional ability and status Nutritional status Physical activity Advanced directives List of other physicians Hospitalizations, surgeries, and ER visits in previous 12 months Vitals Screenings to include cognitive, depression, and falls Referrals and appointments  In addition, I have reviewed and discussed with Lindsay Moore certain preventive protocols, quality metrics, and best practice recommendations. A written personalized care plan for preventive services as well as general preventive health recommendations is available and can be mailed to the patient at her request.      Modesto Charon, RN  12/14/2020

## 2020-12-14 NOTE — Patient Instructions (Signed)
MEDICARE ANNUAL WELLNESS VISIT Health Maintenance Summary and Written Plan of Care  Ms. Lindsay Moore ,  Thank you for allowing me to perform your Medicare Annual Wellness Visit and for your ongoing commitment to your health.   Health Maintenance & Immunization History Health Maintenance  Topic Date Due   COVID-19 Vaccine (1) 12/30/2020 (Originally 09/30/1960)   PNA vac Low Risk Adult (1 of 2 - PCV13) 03/04/2021 (Originally 09/30/2019)   INFLUENZA VACCINE  01/31/2021   MAMMOGRAM  07/26/2022   TETANUS/TDAP  10/11/2026   COLONOSCOPY (Pts 45-55yrs Insurance coverage will need to be confirmed)  05/08/2027   DEXA SCAN  Completed   Hepatitis C Screening  Completed   Zoster Vaccines- Shingrix  Completed   HPV VACCINES  Aged Out   Immunization History  Administered Date(s) Administered   DTaP 01/31/2009   Influenza Split 05/17/2011, 05/29/2012   Influenza Whole 04/02/2013   Influenza,inj,Quad PF,6+ Mos 03/18/2014, 05/13/2015, 05/10/2016, 04/12/2017, 04/12/2018, 05/26/2019, 05/26/2020   Tdap 10/10/2016   Zoster Recombinat (Shingrix) 10/17/2016, 02/12/2017    These are the patient goals that we discussed:  Goals Addressed               This Visit's Progress     Patient Stated (pt-stated)        12/14/2020 AWV Goal: Exercise for General Health  Patient will verbalize understanding of the benefits of increased physical activity: Exercising regularly is important. It will improve your overall fitness, flexibility, and endurance. Regular exercise also will improve your overall health. It can help you control your weight, reduce stress, and improve your bone density. Over the next year, patient will increase physical activity as tolerated with a goal of at least 150 minutes of moderate physical activity per week.  You can tell that you are exercising at a moderate intensity if your heart starts beating faster and you start breathing faster but can still hold a conversation. Moderate-intensity  exercise ideas include: Walking 1 mile (1.6 km) in about 15 minutes Biking Hiking Golfing Dancing Water aerobics Patient will verbalize understanding of everyday activities that increase physical activity by providing examples like the following: Yard work, such as: Insurance underwriter Gardening Washing windows or floors Patient will be able to explain general safety guidelines for exercising:  Before you start a new exercise program, talk with your health care provider. Do not exercise so much that you hurt yourself, feel dizzy, or get very short of breath. Wear comfortable clothes and wear shoes with good support. Drink plenty of water while you exercise to prevent dehydration or heat stroke. Work out until your breathing and your heartbeat get faster.           This is a list of Health Maintenance Items that are overdue or due now: Pneumonia vaccine  Orders/Referrals Placed Today: No orders of the defined types were placed in this encounter.  (Contact our referral department at [phone] if you have not spoken with someone about your referral appointment within the next 5 days)    Follow-up Plan Follow-up with Christen Butter, NP as planned Schedule a nurse visit for your pneumonia vaccine or it can be done in the fall at your next in-office appointment. Medicare wellness visit in one year. Patient will access AVS on mychart.   Health Maintenance, Female Adopting a healthy lifestyle and getting preventive care are important in promoting health and wellness. Ask your health care provider about:  The right schedule for you to have regular tests and exams. Things you can do on your own to prevent diseases and keep yourself healthy. What should I know about diet, weight, and exercise? Eat a healthy diet  Eat a diet that includes plenty of vegetables, fruits, low-fat dairy products, and lean  protein. Do not eat a lot of foods that are high in solid fats, added sugars, or sodium.  Maintain a healthy weight Body mass index (BMI) is used to identify weight problems. It estimates body fat based on height and weight. Your health care provider can help determineyour BMI and help you achieve or maintain a healthy weight. Get regular exercise Get regular exercise. This is one of the most important things you can do for your health. Most adults should: Exercise for at least 150 minutes each week. The exercise should increase your heart rate and make you sweat (moderate-intensity exercise). Do strengthening exercises at least twice a week. This is in addition to the moderate-intensity exercise. Spend less time sitting. Even light physical activity can be beneficial. Watch cholesterol and blood lipids Have your blood tested for lipids and cholesterol at 66 years of age, then havethis test every 5 years. Have your cholesterol levels checked more often if: Your lipid or cholesterol levels are high. You are older than 66 years of age. You are at high risk for heart disease. What should I know about cancer screening? Depending on your health history and family history, you may need to have cancer screening at various ages. This may include screening for: Breast cancer. Cervical cancer. Colorectal cancer. Skin cancer. Lung cancer. What should I know about heart disease, diabetes, and high blood pressure? Blood pressure and heart disease High blood pressure causes heart disease and increases the risk of stroke. This is more likely to develop in people who have high blood pressure readings, are of African descent, or are overweight. Have your blood pressure checked: Every 3-5 years if you are 36-35 years of age. Every year if you are 47 years old or older. Diabetes Have regular diabetes screenings. This checks your fasting blood sugar level. Have the screening done: Once every three years  after age 56 if you are at a normal weight and have a low risk for diabetes. More often and at a younger age if you are overweight or have a high risk for diabetes. What should I know about preventing infection? Hepatitis B If you have a higher risk for hepatitis B, you should be screened for this virus. Talk with your health care provider to find out if you are at risk forhepatitis B infection. Hepatitis C Testing is recommended for: Everyone born from [birth year] through 1966. Anyone with known risk factors for hepatitis C. Sexually transmitted infections (STIs) Get screened for STIs, including gonorrhea and chlamydia, if: You are sexually active and are younger than 67 years of age. You are older than 66 years of age and your health care provider tells you that you are at risk for this type of infection. Your sexual activity has changed since you were last screened, and you are at increased risk for chlamydia or gonorrhea. Ask your health care provider if you are at risk. Ask your health care provider about whether you are at high risk for HIV. Your health care provider may recommend a prescription medicine to help prevent HIV infection. If you choose to take medicine to prevent HIV, you should first get tested for HIV. You should then be  tested every 3 months for as long as you are taking the medicine. Pregnancy If you are about to stop having your period (premenopausal) and you may become pregnant, seek counseling before you get pregnant. Take 400 to 800 micrograms (mcg) of folic acid every day if you become pregnant. Ask for birth control (contraception) if you want to prevent pregnancy. Osteoporosis and menopause Osteoporosis is a disease in which the bones lose minerals and strength with aging. This can result in bone fractures. If you are 32 years old or older, or if you are at risk for osteoporosis and fractures, ask your health care provider if you should: Be screened for bone loss. Take a  calcium or vitamin D supplement to lower your risk of fractures. Be given hormone replacement therapy (HRT) to treat symptoms of menopause. Follow these instructions at home: Lifestyle Do not use any products that contain nicotine or tobacco, such as cigarettes, e-cigarettes, and chewing tobacco. If you need help quitting, ask your health care provider. Do not use street drugs. Do not share needles. Ask your health care provider for help if you need support or information about quitting drugs. Alcohol use Do not drink alcohol if: Your health care provider tells you not to drink. You are pregnant, may be pregnant, or are planning to become pregnant. If you drink alcohol: Limit how much you use to 0-1 drink a day. Limit intake if you are breastfeeding. Be aware of how much alcohol is in your drink. In the [place] one drink equals one 12 oz bottle of beer (355 mL), one 5 oz glass of wine (148 mL), or one 1 oz glass of hard liquor (44 mL). General instructions Schedule regular health, dental, and eye exams. Stay current with your vaccines. Tell your health care provider if: You often feel depressed. You have ever been abused or do not feel safe at home. Summary Adopting a healthy lifestyle and getting preventive care are important in promoting health and wellness. Follow your health care provider's instructions about healthy diet, exercising, and getting tested or screened for diseases. Follow your health care provider's instructions on monitoring your cholesterol and blood pressure. This information is not intended to replace advice given to you by your health care provider. Make sure you discuss any questions you have with your healthcare provider. Document Revised: 06/12/2018 Document Reviewed: 06/12/2018 Elsevier Patient Education  2022 [employer].

## 2020-12-22 ENCOUNTER — Ambulatory Visit: Payer: Medicare PPO | Admitting: Medical-Surgical

## 2020-12-22 NOTE — Telephone Encounter (Signed)
Patient has been scheduled with PCP. AM

## 2021-01-06 DIAGNOSIS — I2510 Atherosclerotic heart disease of native coronary artery without angina pectoris: Secondary | ICD-10-CM | POA: Diagnosis not present

## 2021-01-06 DIAGNOSIS — E782 Mixed hyperlipidemia: Secondary | ICD-10-CM | POA: Diagnosis not present

## 2021-01-06 LAB — COMPLETE METABOLIC PANEL WITH GFR
AG Ratio: 1.7 (calc) (ref 1.0–2.5)
ALT: 17 U/L (ref 6–29)
AST: 21 U/L (ref 10–35)
Albumin: 4.3 g/dL (ref 3.6–5.1)
Alkaline phosphatase (APISO): 84 U/L (ref 37–153)
BUN/Creatinine Ratio: 15 (calc) (ref 6–22)
BUN: 19 mg/dL (ref 7–25)
CO2: 28 mmol/L (ref 20–32)
Calcium: 9.6 mg/dL (ref 8.6–10.4)
Chloride: 106 mmol/L (ref 98–110)
Creat: 1.24 mg/dL — ABNORMAL HIGH (ref 0.50–0.99)
GFR, Est African American: 52 mL/min/{1.73_m2} — ABNORMAL LOW (ref >=60)
GFR, Est Non African American: 45 mL/min/{1.73_m2} — ABNORMAL LOW (ref >=60)
Globulin: 2.5 g/dL (calc) (ref 1.9–3.7)
Glucose, Bld: 91 mg/dL (ref 65–99)
Potassium: 4.5 mmol/L (ref 3.5–5.3)
Sodium: 140 mmol/L (ref 135–146)
Total Bilirubin: 0.4 mg/dL (ref 0.2–1.2)
Total Protein: 6.8 g/dL (ref 6.1–8.1)

## 2021-01-06 LAB — LIPID PANEL
Cholesterol: 156 mg/dL (ref <200)
HDL: 66 mg/dL (ref >=50)
LDL Cholesterol (Calc): 72 mg/dL (calc)
Non-HDL Cholesterol (Calc): 90 mg/dL (calc) (ref <130)
Total CHOL/HDL Ratio: 2.4 (calc) (ref <5.0)
Triglycerides: 101 mg/dL (ref <150)

## 2021-01-07 ENCOUNTER — Other Ambulatory Visit: Payer: Self-pay | Admitting: Medical-Surgical

## 2021-01-07 MED ORDER — ATORVASTATIN CALCIUM 10 MG PO TABS: 10.0000 mg | ORAL_TABLET | Freq: Every day | ORAL | 3 refills | Status: DC

## 2021-01-07 MED ORDER — ATORVASTATIN CALCIUM 20 MG PO TABS: 20.0000 mg | ORAL_TABLET | Freq: Every day | ORAL | 1 refills | Status: DC

## 2021-02-23 ENCOUNTER — Telehealth: Payer: Self-pay | Admitting: Lab

## 2021-02-23 NOTE — Chronic Care Management (AMB) (Signed)
  Chronic Care Management   Note  02/23/2021 Name: Lindsay Moore MRN: [id number] DOB: Oct 09, 1955  [name] is a 65 y.o. year old female who is a primary care patient of Christen Butter, NP. I reached out to [employer] by phone today in response to a referral sent by [name] PCP, Christen Butter, NP.   [name] was given information about Chronic Care Management services today including:  CCM service includes personalized support from designated clinical staff supervised by her physician, including individualized plan of care and coordination with other care providers 24/7 contact phone numbers for assistance for urgent and routine care needs. Service will only be billed when office clinical staff spend 20 minutes or more in a month to coordinate care. Only one practitioner may furnish and bill the service in a calendar month. The patient may stop CCM services at any time (effective at the end of the month) by phone call to the office staff.   Patient agreed to services and verbal consent obtained.   Follow up plan:   [name]  [employer] Scheduler

## 2021-04-04 ENCOUNTER — Telehealth: Payer: Self-pay | Admitting: Pharmacist

## 2021-04-04 NOTE — Chronic Care Management (AMB) (Signed)
    Chronic Care Management Pharmacy Assistant   Name: Lindsay Moore  MRN: [id number] DOB: [birth year]  Lindsay Moore is an 65 y.o. year old female who presents for her initial CCM visit with the clinical pharmacist.  Recent office visits:  12/14/20 Lindsay Butter NP- pt was seen for a routine wellness examination. Pt advised to follow up as planned.  11/24/20 Lindsay Butter NP- pt was seen for a annual physical examination. Labs were ordered and an xray of right foot was completed. Pt started on conjugated estrogens (PREMARIN) vaginal cream and advised to follow up in 6 months.  Recent consult visits:  None Noted  Hospital visits:  None in previous 6 months  Medications: Outpatient Encounter Medications as of 04/04/2021  Medication Sig Note   acetaminophen (TYLENOL) 650 MG CR tablet Take 1 tablet (650 mg total) by mouth every 8 (eight) hours as needed for pain.    albuterol (VENTOLIN HFA) 108 (90 Base) MCG/ACT inhaler Inhale 1-2 puffs into the lungs every 6 (six) hours as needed for wheezing or shortness of breath. (Patient not taking: Reported on 12/14/2020) 06/22/2020: Never used per pt   aspirin EC 81 MG tablet Take 1 tablet (81 mg total) by mouth daily.    atorvastatin (LIPITOR) 10 MG tablet Take 1 tablet (10 mg total) by mouth daily.    Biotin w/ Vitamins C & E (HAIR/SKIN/NAILS PO) Take by mouth daily.    Calcium Carb-Cholecalciferol (CALCIUM + D3 PO) Take by mouth.    conjugated estrogens (PREMARIN) vaginal cream Use daily for 14 days then reduce to twice weekly.    diazepam (VALIUM) 5 MG tablet 1 tab 2 hours before procedure or imaging, take another tab 30 minutes to 1 hour before if not feeling relaxed (do not drive on this med)    hydrOXYzine (ATARAX/VISTARIL) 10 MG tablet Take 1 tablet (10 mg total) by mouth 3 (three) times daily as needed.    MEGARED OMEGA-3 KRILL OIL PO Take by mouth.    metoprolol tartrate (LOPRESSOR) 50 MG tablet Take 1 tablet (50 mg total) by mouth  2 (two) times daily.    Multiple Vitamin (MULTIVITAMIN) tablet Take 1 tablet by mouth daily.    potassium chloride (KLOR-CON) 10 MEQ tablet Take 1 tablet by mouth once daily    RESTASIS 0.05 % ophthalmic emulsion 1 drop 2 (two) times daily.    triamcinolone ointment (KENALOG) 0.1 % Apply 1 application topically 2 (two) times daily. To affected areas    No facility-administered encounter medications on file as of 04/04/2021.    Current Medication List  Acetaminophen 650 MG CR tablet  Albuterol 108 (90 Base) MCG/ACT inhaler last filled 02/27/20  aspirin EC 81 MG tablet atorvastatin (LIPITOR) 10 MG tablet last filled 01/07/21 90 DS Biotin w/ Vitamins C & E (HAIR/SKIN/NAILS PO) Calcium Carb-Cholecalciferol (CALCIUM + D3 PO) conjugated estrogens (PREMARIN) vaginal cream last filled 11/24/20  diazepam (VALIUM) 5 MG tablet last filled 07/01/20 2 DS hydrOXYzine (ATARAX/VISTARIL) 10 MG tablet last filled 06/28/20 30 DS MEGARED OMEGA-3 KRILL OIL PO metoprolol tartrate (LOPRESSOR) 50 MG tablet last filled 06/28/20 180 DS Multiple Vitamin (MULTIVITAMIN) tablet potassium chloride (KLOR-CON) 10 MEQ tablet last filled 11/26/20 90 DS RESTASIS 0.05 % ophthalmic emulsion last filled 07/08/20 triamcinolone ointment (KENALOG) 0.1 % last filled 05/26/19   Gasper Sells CMA Clinical Pharmacist Assistant [phone]

## 2021-04-05 NOTE — Progress Notes (Signed)
Chronic Care Management Pharmacy Note  04/06/2021 Name:  Lindsay Moore MRN:  [id number] DOB:  15-May-1956  Summary: addressed HTN, HLD. Brief discussion of OTC omeprazole, advised patient to try famotidine OTC instead of long term PPI.  Recommendations/Changes made from today's visit: check BP at home 2x per week, document, and will discuss at next visit to ensure optimal BP control.  Plan: f/u with pharmacist in 1 month  Subjective: Lindsay Moore is an 65 y.o. year old female who is a primary patient of Samuel Bouche, NP.  The CCM team was consulted for assistance with disease management and care coordination needs.    Engaged with patient face to face for initial visit in response to provider referral for pharmacy case management and/or care coordination services.   Consent to Services:  The patient was given information about Chronic Care Management services, agreed to services, and gave verbal consent prior to initiation of services.  Please see initial visit note for detailed documentation.   Patient Care Team: Samuel Bouche, NP as PCP - General (Nurse Practitioner) Marcial Pacas, DO (Family Medicine) Darius Bump, [facility] as Pharmacist (Pharmacist)  Recent office visits:  12/14/20 Samuel Bouche NP- pt was seen for a routine wellness examination. Pt advised to follow up as planned.   11/24/20 Samuel Bouche NP- pt was seen for a annual physical examination. Labs were ordered and an xray of right foot was completed. Pt started on conjugated estrogens (PREMARIN) vaginal cream and advised to follow up in 6 months.   Recent consult visits:  None Noted   Hospital visits:  None in previous 6 months  Objective:  Lab Results  Component Value Date   CREATININE 1.24 (H) 01/06/2021   CREATININE 1.01 (H) 11/24/2020   CREATININE 1.08 (H) 05/26/2020    No results found for: HGBA1C Last diabetic Eye exam: No results found for: HMDIABEYEEXA  Last diabetic Foot exam: No results  found for: HMDIABFOOTEX      Component Value Date/Time   CHOL 156 01/06/2021 0000   TRIG 101 01/06/2021 0000   HDL 66 01/06/2021 0000   CHOLHDL 2.4 01/06/2021 0000   VLDL 33 (H) 10/03/2016 0826   LDLCALC 72 01/06/2021 0000    Hepatic Function Latest Ref Rng & Units 01/06/2021 11/24/2020 05/26/2020  Total Protein 6.1 - 8.1 g/dL 6.8 7.3 7.4  Albumin 3.5 - 5.0 g/dL - - -  AST 10 - 35 U/L '21 21 22  ' ALT 6 - 29 U/L '17 17 13  ' Alk Phosphatase 38 - 126 U/L - - -  Total Bilirubin 0.2 - 1.2 mg/dL 0.4 0.4 0.3  Bilirubin, Direct 0.0 - 0.2 mg/dL - - -    Lab Results  Component Value Date/Time   TSH 1.49 11/24/2020 12:00 AM   TSH 1.24 05/26/2020 10:11 AM   FREET4 1.30 07/26/2015 03:53 PM    CBC Latest Ref Rng & Units 11/24/2020 05/26/2020 03/13/2020  WBC 3.8 - 10.8 Thousand/uL 8.0 9.1 9.3  Hemoglobin 11.7 - 15.5 g/dL 15.0 14.9 15.5(H)  Hematocrit 35.0 - 45.0 % 45.3(H) 45.4(H) 47.5(H)  Platelets 140 - 400 Thousand/uL 263 319 379    Lab Results  Component Value Date/Time   VD25OH 34 10/21/2018 08:47 AM    Clinical ASCVD:  The 10-year ASCVD risk score (Arnett DK, et al., 2019) is: 10.1%   Values used to calculate the score:     Age: 35 years     Sex: Female     Is Non-Hispanic African  American: No     Diabetic: No     Tobacco smoker: No     Systolic Blood Pressure: 630 mmHg     Is BP treated: Yes     HDL Cholesterol: 66 mg/dL     Total Cholesterol: 156 mg/dL     Social History   Tobacco Use  Smoking Status Former   Packs/day: 1.00   Years: 25.00   Pack years: 25.00   Types: Cigarettes   Quit date: 07/03/1997   Years since quitting: 23.7  Smokeless Tobacco Never   BP Readings from Last 3 Encounters:  04/06/21 (!) 156/93  11/24/20 (!) 147/89  07/12/20 (!) 151/83   Pulse Readings from Last 3 Encounters:  04/06/21 61  11/24/20 82  07/12/20 74   Wt Readings from Last 3 Encounters:  11/24/20 160 lb 12.8 oz (72.9 kg)  07/12/20 154 lb 9.6 oz (70.1 kg)  06/28/20 155 lb  (70.3 kg)    Assessment: Review of patient past medical history, allergies, medications, health status, including review of consultants reports, laboratory and other test data, was performed as part of comprehensive evaluation and provision of chronic care management services.   SDOH:  (Social Determinants of Health) assessments and interventions performed:    CCM Care Plan  Allergies  Allergen Reactions   Ibuprofen Shortness Of Breath   Amlodipine Swelling   Bee Venom Other (See Comments)   Lisinopril Swelling    Medications Reviewed Today     Reviewed by Darius Bump, [facility] (Pharmacist) on 04/06/21 at 0917  Med List Status: <None>   Medication Order Taking? Sig Documenting Provider Last Dose Status Informant  acetaminophen (TYLENOL) 650 MG CR tablet [id number] Yes Take 1 tablet (650 mg total) by mouth every 8 (eight) hours as needed for pain. Silverio Decamp, MD Taking Active            Med Note (GARNER, TIFFANY L   Wed Mar 17, 2020  1:32 PM)    albuterol (VENTOLIN HFA) 108 (90 Base) MCG/ACT inhaler [id number] No Inhale 1-2 puffs into the lungs every 6 (six) hours as needed for wheezing or shortness of breath.  Patient not taking: Reported on 04/06/2021   Tyrell Antonio Not Taking Active            Med Note Page Spiro   Tue Jun 22, 2020  9:36 AM) Never used per pt  aspirin EC 81 MG tablet [id number] Yes Take 1 tablet (81 mg total) by mouth daily. Marcial Pacas, DO Taking Active   atorvastatin (LIPITOR) 10 MG tablet [id number] Yes Take 1 tablet (10 mg total) by mouth daily. Samuel Bouche, NP Taking Active   Biotin w/ Vitamins C & E (HAIR/SKIN/NAILS PO) [id number] Yes Take by mouth daily. [provider] Taking Active   Calcium Carb-Cholecalciferol (CALCIUM + D3 PO) [id number] Yes Take by mouth. [provider] Taking Active   conjugated estrogens (PREMARIN) vaginal cream [id number] Yes Use daily for 14 days then reduce to twice weekly. Samuel Bouche, NP  Taking Active   diazepam (VALIUM) 5 MG tablet [id number] Yes 1 tab 2 hours before procedure or imaging, take another tab 30 minutes to 1 hour before if not feeling relaxed (do not drive on this med) Samuel Bouche, NP Taking Active   hydrOXYzine (ATARAX/VISTARIL) 10 MG tablet [id number] No Take 1 tablet (10 mg total) by mouth 3 (three) times daily as needed.  Patient not taking: Reported on 04/06/2021   Samuel Bouche, NP Not  Taking Active   MEGARED OMEGA-3 KRILL OIL PO [id number] Yes Take by mouth. [provider] Taking Active   metoprolol tartrate (LOPRESSOR) 50 MG tablet [id number] Yes Take 1 tablet (50 mg total) by mouth 2 (two) times daily. Samuel Bouche, NP Taking Active   Multiple Vitamin (MULTIVITAMIN) tablet [id number] Yes Take 1 tablet by mouth daily. [provider] Taking Active   omeprazole (PRILOSEC) 20 MG capsule [id number] Yes Take 20 mg by mouth daily. [provider] Taking Active   potassium chloride (KLOR-CON) 10 MEQ tablet [id number] Yes Take 1 tablet by mouth once daily Samuel Bouche, NP Taking Active   RESTASIS 0.05 % ophthalmic emulsion [id number] Yes 1 drop 2 (two) times daily. [provider] Taking Active   triamcinolone ointment (KENALOG) 0.1 % [id number] Yes Apply 1 application topically 2 (two) times daily. To affected areas Emeterio Reeve, DO Taking Active             Patient Active Problem List   Diagnosis Date Noted   Heart defect    AC (acromioclavicular) joint bone spurs 05/07/2020   High blood pressure 05/07/2020   Mild mitral valve regurgitation 05/07/2020   Palpitations 03/17/2020   Chronic obstructive pulmonary disease with acute exacerbation ([place]) 03/04/2020   Health education 03/04/2020   Closed displaced fracture of proximal phalanx of third toe of right foot 12/30/2019   Flexural eczema 11/23/2016   Primary osteoarthritis of both knees 08/31/2016   Biceps tendinitis on right 06/22/2015   Serous otitis media 01/06/2015    Postmenopausal bleeding 08/17/2014   History of abnormal cervical Pap smear 06/19/2014   Lumbar strain 01/06/2014   Breast mass 07/11/2013   Encounter for colonoscopy due to history of colonic polyp 06/19/2013   TIA (transient ischemic attack) 06/10/2013   Essential hypertension, benign 06/10/2013   Septal defect 06/10/2013   Ocular migraine 06/10/2013   Situational anxiety 06/10/2013   Hyperlipidemia 06/10/2013   Right knee pain 06/10/2013    Immunization History  Administered Date(s) Administered   DTaP 01/31/2009   Influenza Split 05/17/2011, 05/29/2012   Influenza Whole 04/02/2013   Influenza,inj,Quad PF,6+ Mos 03/18/2014, 05/13/2015, 05/10/2016, 04/12/2017, 04/12/2018, 05/26/2019, 05/26/2020   Tdap 10/10/2016   Zoster Recombinat (Shingrix) 10/17/2016, 02/12/2017    Conditions to be addressed/monitored: HTN and HLD  Care Plan : Medication Management  Updates made by Darius Bump, [place] since 04/06/2021 12:00 AM     Problem: HTN, HLD      Long-Range Goal: Disease Progression Prevention   Start Date: 04/06/2021  This Visit's Progress: On track  Priority: High  Note:    Current Barriers:  None at present  Pharmacist Clinical Goal(s):  Over the next 30 days, patient will adhere to plan to optimize therapeutic regimen for hypertension as evidenced by report of adherence to recommended medication management changes through collaboration with PharmD and provider.   Interventions: 1:1 collaboration with Samuel Bouche, NP regarding development and update of comprehensive plan of care as evidenced by provider attestation and co-signature Inter-disciplinary care team collaboration (see longitudinal plan of care) Comprehensive medication review performed; medication list updated in electronic medical record  Hypertension:  Uncontrolled current treatment:metoprolol 23m BID;   Current home readings: cannot recall, but usually ~ 130/80s per patient  Denies  hypotensive/hypertensive symptoms  Counseled on goal SBP <140 Recommended continue current regimen, check BP at home 2x per week to write down and discuss at next appt  Hyperlipidemia:  Controlled; current treatment:atorvastatin 158mdaily, megared omega 3 krill oil; LDL  72  Recommended continue current regimen  Patient Goals/Self-Care Activities Over the next 30 days, patient will:  take medications as prescribed and check blood pressure 2x per week, document, and provide at future appointments  Follow Up Plan: Telephone follow up appointment with care management team member scheduled for:  1 month      Medication Assistance: None required.  Patient affirms current coverage meets needs.  Patient's preferred pharmacy is:  [facility] [address] [place] [address] Phone: [phone] Fax: [phone]  Uses pill box? Yes Pt endorses 100% compliance  Follow Up:  Patient agrees to Care Plan and Follow-up.  Plan: Telephone follow up appointment with care management team member scheduled for:  1 month  Darius Bump

## 2021-04-06 ENCOUNTER — Ambulatory Visit (INDEPENDENT_AMBULATORY_CARE_PROVIDER_SITE_OTHER): Payer: Medicare PPO | Admitting: Pharmacist

## 2021-04-06 VITALS — BP 156/93 | HR 61

## 2021-04-06 DIAGNOSIS — I10 Essential (primary) hypertension: Secondary | ICD-10-CM

## 2021-04-06 DIAGNOSIS — E782 Mixed hyperlipidemia: Secondary | ICD-10-CM

## 2021-04-06 NOTE — Patient Instructions (Signed)
Visit Information   PATIENT GOALS:   Goals Addressed             This Visit's Progress    Medication Management       Patient Goals/Self-Care Activities Over the next 30 days, patient will:  take medications as prescribed and check blood pressure 2x per week, document, and provide at future appointments  Follow Up Plan: Telephone follow up appointment with care management team member scheduled for:  1 month         Consent to CCM Services: Ms. Willbanks was given information about Chronic Care Management services including:  CCM service includes personalized support from designated clinical staff supervised by her physician, including individualized plan of care and coordination with other care providers 24/7 contact phone numbers for assistance for urgent and routine care needs. Service will only be billed when office clinical staff spend 20 minutes or more in a month to coordinate care. Only one practitioner may furnish and bill the service in a calendar month. The patient may stop CCM services at any time (effective at the end of the month) by phone call to the office staff. The patient will be responsible for cost sharing (co-pay) of up to 20% of the service fee (after annual deductible is met).  Patient agreed to services and verbal consent obtained.   Patient verbalizes understanding of instructions provided today and agrees to view in [place].   Telephone follow up appointment with care management team member scheduled for: 1 month  Larinda Buttery, PharmD Clinical Pharmacist [facility] [phone]   CLINICAL CARE PLAN: Patient Care Plan: Medication Management     Problem Identified: HTN, HLD      Long-Range Goal: Disease Progression Prevention   Start Date: 04/06/2021  This Visit's Progress: On track  Priority: High  Note:    Current Barriers:  None at present  Pharmacist Clinical Goal(s):  Over the next 30 days, patient  will adhere to plan to optimize therapeutic regimen for hypertension as evidenced by report of adherence to recommended medication management changes through collaboration with PharmD and provider.   Interventions: 1:1 collaboration with Samuel Bouche, NP regarding development and update of comprehensive plan of care as evidenced by provider attestation and co-signature Inter-disciplinary care team collaboration (see longitudinal plan of care) Comprehensive medication review performed; medication list updated in electronic medical record  Hypertension:  Uncontrolled current treatment:metoprolol 49m BID;   Current home readings: cannot recall, but usually ~ 130/80s per patient  Denies hypotensive/hypertensive symptoms  Counseled on goal SBP <140 Recommended continue current regimen, check BP at home 2x per week to write down and discuss at next appt  Hyperlipidemia:  Controlled; current treatment:atorvastatin 146mdaily, megared omega 3 krill oil; LDL 72  Recommended continue current regimen  Patient Goals/Self-Care Activities Over the next 30 days, patient will:  take medications as prescribed and check blood pressure 2x per week, document, and provide at future appointments  Follow Up Plan: Telephone follow up appointment with care management team member scheduled for:  1 month

## 2021-04-20 ENCOUNTER — Other Ambulatory Visit: Payer: Self-pay

## 2021-04-20 DIAGNOSIS — I10 Essential (primary) hypertension: Secondary | ICD-10-CM

## 2021-04-20 DIAGNOSIS — E782 Mixed hyperlipidemia: Secondary | ICD-10-CM

## 2021-04-29 ENCOUNTER — Other Ambulatory Visit: Payer: Self-pay

## 2021-04-29 ENCOUNTER — Other Ambulatory Visit: Payer: Self-pay | Admitting: Medical-Surgical

## 2021-04-29 ENCOUNTER — Ambulatory Visit: Payer: Medicare PPO | Admitting: Pharmacist

## 2021-04-29 ENCOUNTER — Encounter: Payer: Self-pay | Admitting: Medical-Surgical

## 2021-04-29 DIAGNOSIS — E782 Mixed hyperlipidemia: Secondary | ICD-10-CM

## 2021-04-29 DIAGNOSIS — I10 Essential (primary) hypertension: Secondary | ICD-10-CM

## 2021-04-29 MED ORDER — CARVEDILOL 12.5 MG PO TABS: 12.5000 mg | ORAL_TABLET | Freq: Two times a day (BID) | ORAL | 3 refills | Status: DC

## 2021-04-29 NOTE — Patient Instructions (Signed)
Visit Information  PATIENT GOALS:  Goals Addressed             This Visit's Progress    Medication Management       Patient Goals/Self-Care Activities Over the next 30 days, patient will:  take medications as prescribed and check blood pressure 2x per week, document, and provide at future appointments  Follow Up Plan: Telephone follow up appointment with care management team member scheduled for:  1 month           Patient verbalizes understanding of instructions provided today and agrees to view in MyChart.   Telephone follow up appointment with care management team member scheduled for: 1 month  Gabriel Carina

## 2021-04-29 NOTE — Progress Notes (Signed)
Chronic Care Management Pharmacy Note  04/29/2021 Name:  Lindsay Moore MRN:  [id number] DOB:  1955/09/20  Summary: addressed HTN, HLD. Patient requested earlier appointment due to BP elevations after we discussed she check it at home.  BP readings as follows:  10/6: 171/88 at 11am 10/14: 148/96 at 11:30 10/17: 139/100 at 4:30pm 10/18: 165/91 9:30am 10/19: 134/92 9am 10/22: 157/93 10am 10/24: 176/110 5pm 10/25: 154/87 8am 10/26: 150/92 10:30am 10/28: 146/90 9:30am   Recommendations/Changes made from today's visit: Recommended consider either change to metoprolol to carvedilol 12.60m BID, OR add hctz 12.571mdaily pending discussion w/PCP , check BP at home 2x per week to write down and discuss at next appt   Plan: f/u with pharmacist in 1 month  Subjective: ChGabi Moore an [age].o. year old female who is a primary patient of JeSamuel BoucheNP.  The CCM team was consulted for assistance with disease management and care coordination needs.    Engaged with patient face to face for follow up visit in response to provider referral for pharmacy case management and/or care coordination services.   Consent to Services:  The patient was given information about Chronic Care Management services, agreed to services, and gave verbal consent prior to initiation of services.  Please see initial visit note for detailed documentation.   Patient Care Team: JeSamuel BoucheNP as PCP - General (Nurse Practitioner) HoMarcial PacasDO (Family Medicine) KlDarius BumpRPEmp[facility] Pharmacist (Pharmacist)  Recent office visits:  12/14/20 JoSamuel BoucheP- pt was seen for a routine wellness examination. Pt advised to follow up as planned.   11/24/20 JoSamuel BoucheP- pt was seen for a annual physical examination. Labs were ordered and an xray of right foot was completed. Pt started on conjugated estrogens (PREMARIN) vaginal cream and advised to follow up in 6 months.   Recent consult visits:   None Noted   Hospital visits:  None in previous 6 months  Objective:  Lab Results  Component Value Date   CREATININE 1.24 (H) 01/06/2021   CREATININE 1.01 (H) 11/24/2020   CREATININE 1.08 (H) 05/26/2020       Component Value Date/Time   CHOL 156 01/06/2021 0000   TRIG 101 01/06/2021 0000   HDL 66 01/06/2021 0000   CHOLHDL 2.4 01/06/2021 0000   VLDL 33 (H) 10/03/2016 0826   LDLCALC 72 01/06/2021 0000    Hepatic Function Latest Ref Rng & Units 01/06/2021 11/24/2020 05/26/2020  Total Protein 6.1 - 8.1 g/dL 6.8 7.3 7.4  Albumin 3.5 - 5.0 g/dL - - -  AST 10 - 35 U/L '21 21 22  ' ALT 6 - 29 U/L '17 17 13  ' Alk Phosphatase 38 - 126 U/L - - -  Total Bilirubin 0.2 - 1.2 mg/dL 0.4 0.4 0.3  Bilirubin, Direct 0.0 - 0.2 mg/dL - - -    Lab Results  Component Value Date/Time   TSH 1.49 11/24/2020 12:00 AM   TSH 1.24 05/26/2020 10:11 AM   FREET4 1.30 07/26/2015 03:53 PM    CBC Latest Ref Rng & Units 11/24/2020 05/26/2020 03/13/2020  WBC 3.8 - 10.8 Thousand/uL 8.0 9.1 9.3  Hemoglobin 11.7 - 15.5 g/dL 15.0 14.9 15.5(H)  Hematocrit 35.0 - 45.0 % 45.3(H) 45.4(H) 47.5(H)  Platelets 140 - 400 Thousand/uL 263 319 379    Lab Results  Component Value Date/Time   VD25OH 34 10/21/2018 08:47 AM    Clinical ASCVD:  The 10-year ASCVD risk score (Arnett DK, et al.,  2019) is: 10.1%   Values used to calculate the score:     Age: 53 years     Sex: Female     Is Non-Hispanic African American: No     Diabetic: No     Tobacco smoker: No     Systolic Blood Pressure: 401 mmHg     Is BP treated: Yes     HDL Cholesterol: 66 mg/dL     Total Cholesterol: 156 mg/dL     Social History   Tobacco Use  Smoking Status Former   Packs/day: 1.00   Years: 25.00   Pack years: 25.00   Types: Cigarettes   Quit date: 07/03/1997   Years since quitting: 23.8  Smokeless Tobacco Never   BP Readings from Last 3 Encounters:  04/06/21 (!) 156/93  11/24/20 (!) 147/89  07/12/20 (!) 151/83   Pulse Readings  from Last 3 Encounters:  04/06/21 61  11/24/20 82  07/12/20 74   Wt Readings from Last 3 Encounters:  11/24/20 160 lb 12.8 oz (72.9 kg)  07/12/20 154 lb 9.6 oz (70.1 kg)  06/28/20 155 lb (70.3 kg)    Assessment: Review of patient past medical history, allergies, medications, health status, including review of consultants reports, laboratory and other test data, was performed as part of comprehensive evaluation and provision of chronic care management services.   SDOH:  (Social Determinants of Health) assessments and interventions performed:    CCM Care Plan  Allergies  Allergen Reactions   Ibuprofen Shortness Of Breath   Amlodipine Swelling   Bee Venom Other (See Comments)   Lisinopril Swelling    Medications Reviewed Today     Reviewed by Darius Bump, [facility] (Pharmacist) on 04/29/21 at 1016  Med List Status: <None>   Medication Order Taking? Sig Documenting Provider Last Dose Status Informant  acetaminophen (TYLENOL) 650 MG CR tablet [id number] Yes Take 1 tablet (650 mg total) by mouth every 8 (eight) hours as needed for pain. Silverio Decamp, MD Taking Active            Med Note (GARNER, TIFFANY L   Wed Mar 17, 2020  1:32 PM)    albuterol (VENTOLIN HFA) 108 (90 Base) MCG/ACT inhaler [id number] No Inhale 1-2 puffs into the lungs every 6 (six) hours as needed for wheezing or shortness of breath.  Patient not taking: No sig reported   Tyrell Antonio Not Taking Active            Med Note Page Spiro   Tue Jun 22, 2020  9:36 AM) Never used per pt  aspirin EC 81 MG tablet [id number] Yes Take 1 tablet (81 mg total) by mouth daily. Marcial Pacas, DO Taking Active   atorvastatin (LIPITOR) 10 MG tablet [id number] Yes Take 1 tablet (10 mg total) by mouth daily. Samuel Bouche, NP Taking Active   Biotin w/ Vitamins C & E (HAIR/SKIN/NAILS PO) [id number] Yes Take by mouth daily. [provider] Taking Active   Calcium Carb-Cholecalciferol (CALCIUM + D3 PO) [id number]  Yes Take by mouth. [provider] Taking Active   conjugated estrogens (PREMARIN) vaginal cream [id number] Yes Use daily for 14 days then reduce to twice weekly. Samuel Bouche, NP Taking Active   hydrOXYzine (ATARAX/VISTARIL) 10 MG tablet [id number] No Take 1 tablet (10 mg total) by mouth 3 (three) times daily as needed.  Patient not taking: No sig reported   Samuel Bouche, NP Not Taking Active   MEGARED OMEGA-3 KRILL OIL PO [id number]  Yes Take by mouth. [provider] Taking Active   metoprolol tartrate (LOPRESSOR) 50 MG tablet [id number] Yes Take 1 tablet (50 mg total) by mouth 2 (two) times daily. Samuel Bouche, NP Taking Active   Multiple Vitamin (MULTIVITAMIN) tablet [id number] Yes Take 1 tablet by mouth daily. [provider] Taking Active   omeprazole (PRILOSEC) 20 MG capsule [id number] Yes Take 20 mg by mouth daily. [provider] Taking Active   potassium chloride (KLOR-CON) 10 MEQ tablet [id number] Yes Take 1 tablet by mouth once daily Samuel Bouche, NP Taking Active   RESTASIS 0.05 % ophthalmic emulsion [id number] Yes 1 drop 2 (two) times daily. [provider] Taking Active   triamcinolone ointment (KENALOG) 0.1 % [id number] Yes Apply 1 application topically 2 (two) times daily. To affected areas Emeterio Reeve, DO Taking Active             Patient Active Problem List   Diagnosis Date Noted   Heart defect    AC (acromioclavicular) joint bone spurs 05/07/2020   High blood pressure 05/07/2020   Mild mitral valve regurgitation 05/07/2020   Palpitations 03/17/2020   Chronic obstructive pulmonary disease with acute exacerbation ([place]) 03/04/2020   Health education 03/04/2020   Closed displaced fracture of proximal phalanx of third toe of right foot 12/30/2019   Flexural eczema 11/23/2016   Primary osteoarthritis of both knees 08/31/2016   Biceps tendinitis on right 06/22/2015   Serous otitis media 01/06/2015   Postmenopausal bleeding 08/17/2014    History of abnormal cervical Pap smear 06/19/2014   Lumbar strain 01/06/2014   Breast mass 07/11/2013   Encounter for colonoscopy due to history of colonic polyp 06/19/2013   TIA (transient ischemic attack) 06/10/2013   Essential hypertension, benign 06/10/2013   Septal defect 06/10/2013   Ocular migraine 06/10/2013   Situational anxiety 06/10/2013   Hyperlipidemia 06/10/2013   Right knee pain 06/10/2013    Immunization History  Administered Date(s) Administered   DTaP 01/31/2009   Influenza Split 05/17/2011, 05/29/2012   Influenza Whole 04/02/2013   Influenza,inj,Quad PF,6+ Mos 03/18/2014, 05/13/2015, 05/10/2016, 04/12/2017, 04/12/2018, 05/26/2019, 05/26/2020   Tdap 10/10/2016   Zoster Recombinat (Shingrix) 10/17/2016, 02/12/2017    Conditions to be addressed/monitored: HTN and HLD  Care Plan : Medication Management  Updates made by Darius Bump, [place] since 04/29/2021 12:00 AM     Problem: HTN, HLD      Long-Range Goal: Disease Progression Prevention   Start Date: 04/06/2021  Recent Progress: On track  Priority: High  Note:    Current Barriers:  None at present  Pharmacist Clinical Goal(s):  Over the next 30 days, patient will adhere to plan to optimize therapeutic regimen for hypertension as evidenced by report of adherence to recommended medication management changes through collaboration with PharmD and provider.   Interventions: 1:1 collaboration with Samuel Bouche, NP regarding development and update of comprehensive plan of care as evidenced by provider attestation and co-signature Inter-disciplinary care team collaboration (see longitudinal plan of care) Comprehensive medication review performed; medication list updated in electronic medical record  Hypertension:  Uncontrolled current treatment:metoprolol 82m BID;   Current home readings:  10/6: 171/88 at 11am 10/14: 148/96 at 11:30 10/17: 139/100 at 4:30pm 10/18: 165/91 9:30am 10/19: 134/92  9am 10/22: 157/93 10am 10/24: 176/110 5pm 10/25: 154/87 8am 10/26: 150/92 10:30am 10/28: 146/90 9:30am   HR 60s at rest, 80s with walking    Denies hypotensive/hypertensive symptoms  Counseled on goal SBP <140 Recommended consider either change to metoprolol to  carvedilol 12.29m BID, OR add hctz 12.5107mdaily pending discussion w/PCP , check BP at home 2x per week to write down and discuss at next appt  Hyperlipidemia:  Controlled; current treatment:atorvastatin 1068maily, megared omega 3 krill oil; LDL 72  Recommended continue current regimen  Patient Goals/Self-Care Activities Over the next 30 days, patient will:  take medications as prescribed and check blood pressure 2x per week, document, and provide at future appointments  Follow Up Plan: Telephone follow up appointment with care management team member scheduled for:  1 month       Medication Assistance: None required.  Patient affirms current coverage meets needs.  [facility] [place]one: [phone]: [phone] pill box? Yes Pt endorses 100% compliance  Follow Up:  Patient agrees to Care Plan and Follow-up.  Plan: Telephone follow up appointment with care management team member scheduled for:  1 month  KeeDarius Bump

## 2021-05-02 DIAGNOSIS — E782 Mixed hyperlipidemia: Secondary | ICD-10-CM | POA: Diagnosis not present

## 2021-05-02 DIAGNOSIS — I10 Essential (primary) hypertension: Secondary | ICD-10-CM

## 2021-05-03 ENCOUNTER — Encounter: Payer: Self-pay | Admitting: Medical-Surgical

## 2021-05-03 MED ORDER — AMOXICILLIN 500 MG PO CAPS: 2000.0000 mg | ORAL_CAPSULE | Freq: Once | ORAL | 0 refills | Status: DC

## 2021-05-12 ENCOUNTER — Telehealth: Payer: Medicare PPO

## 2021-05-17 DIAGNOSIS — E782 Mixed hyperlipidemia: Secondary | ICD-10-CM | POA: Diagnosis not present

## 2021-05-17 DIAGNOSIS — I10 Essential (primary) hypertension: Secondary | ICD-10-CM | POA: Diagnosis not present

## 2021-05-17 LAB — CBC WITH DIFFERENTIAL/PLATELET
Absolute Monocytes: 510 cells/uL (ref 200–950)
Basophils Absolute: 53 cells/uL (ref 0–200)
Basophils Relative: 0.7 %
Eosinophils Absolute: 413 cells/uL (ref 15–500)
Eosinophils Relative: 5.5 %
HCT: 43.9 % (ref 35.0–45.0)
Hemoglobin: 14.4 g/dL (ref 11.7–15.5)
Lymphs Abs: 2070 cells/uL (ref 850–3900)
MCH: 29.8 pg (ref 27.0–33.0)
MCHC: 32.8 g/dL (ref 32.0–36.0)
MCV: 90.9 fL (ref 80.0–100.0)
MPV: 11 fL (ref 7.5–12.5)
Monocytes Relative: 6.8 %
Neutro Abs: 4455 cells/uL (ref 1500–7800)
Neutrophils Relative %: 59.4 %
Platelets: 307 10*3/uL (ref 140–400)
RBC: 4.83 10*6/uL (ref 3.80–5.10)
RDW: 11.8 % (ref 11.0–15.0)
Total Lymphocyte: 27.6 %
WBC: 7.5 10*3/uL (ref 3.8–10.8)

## 2021-05-17 LAB — COMPLETE METABOLIC PANEL WITH GFR
AG Ratio: 1.7 (calc) (ref 1.0–2.5)
ALT: 24 U/L (ref 6–29)
AST: 28 U/L (ref 10–35)
Albumin: 4.3 g/dL (ref 3.6–5.1)
Alkaline phosphatase (APISO): 90 U/L (ref 37–153)
BUN/Creatinine Ratio: 18 (calc) (ref 6–22)
BUN: 20 mg/dL (ref 7–25)
CO2: 28 mmol/L (ref 20–32)
Calcium: 9.8 mg/dL (ref 8.6–10.4)
Chloride: 103 mmol/L (ref 98–110)
Creat: 1.09 mg/dL — ABNORMAL HIGH (ref 0.50–1.05)
Globulin: 2.5 g/dL (calc) (ref 1.9–3.7)
Glucose, Bld: 99 mg/dL (ref 65–99)
Potassium: 4.6 mmol/L (ref 3.5–5.3)
Sodium: 139 mmol/L (ref 135–146)
Total Bilirubin: 0.4 mg/dL (ref 0.2–1.2)
Total Protein: 6.8 g/dL (ref 6.1–8.1)
eGFR: 56 mL/min/{1.73_m2} — ABNORMAL LOW (ref >=60)

## 2021-05-17 LAB — LIPID PANEL
Cholesterol: 165 mg/dL (ref <200)
HDL: 61 mg/dL (ref >=50)
LDL Cholesterol (Calc): 81 mg/dL (calc)
Non-HDL Cholesterol (Calc): 104 mg/dL (calc) (ref <130)
Total CHOL/HDL Ratio: 2.7 (calc) (ref <5.0)
Triglycerides: 126 mg/dL (ref <150)

## 2021-05-20 ENCOUNTER — Ambulatory Visit (INDEPENDENT_AMBULATORY_CARE_PROVIDER_SITE_OTHER): Payer: Medicare PPO | Admitting: Pharmacist

## 2021-05-20 ENCOUNTER — Other Ambulatory Visit: Payer: Self-pay

## 2021-05-20 DIAGNOSIS — E782 Mixed hyperlipidemia: Secondary | ICD-10-CM

## 2021-05-20 DIAGNOSIS — I10 Essential (primary) hypertension: Secondary | ICD-10-CM

## 2021-05-20 NOTE — Progress Notes (Signed)
Toothache, caught cold from grandson  10/26 150/90 10:30am 10/28 146/90 11/5 141/85 11/6 151/93  11/7 127/89 11/8 151/98 11/9 128/90 11/10 156/81 11/14 150/91, 130/95 11/15 158/83, 174/85 (9:30am)  11/16 157/88 HR 60-70s  Chronic Care Management Pharmacy Note  05/20/2021 Name:  Lindsay Moore MRN:  [id number] DOB:  Sep 13, 1955  Summary: addressed HTN, HLD. Patient states it has been a hectic past few weeks with her health: got a toothache & multiple teeth pulled, then caught a cold. She has been changed from metoprolol to carvedilol with the hope of further BP lowering.  BP readings as follows:  10/26 150/90 10:30am 10/28 146/90 11/5 141/85 11/6 151/93  11/7 127/89 11/8 151/98 11/9 128/90 11/10 156/81 11/14 150/91, 130/95 11/15 158/83, 174/85 (9:30am)  11/16 157/88 HR 60-70s  Recommendations/Changes made from today's visit: Recommended patient continue to monitor BP values - may be elevated from stress related to her past several hectic weeks.   If medication changes: consider either increase to carvedilol 62m BID, OR add hctz 12.541mdaily pending discussion w/PCP , check BP at home 2x per week to write down and discuss at next appt   Plan: f/u with pharmacist in 1 month  Subjective: ChLatitia Moore an [age].o. year old female who is a primary patient of JeSamuel BoucheNP.  The CCM team was consulted for assistance with disease management and care coordination needs.    Engaged with patient by telephone for follow up visit in response to provider referral for pharmacy case management and/or care coordination services.   Consent to Services:  The patient was given information about Chronic Care Management services, agreed to services, and gave verbal consent prior to initiation of services.  Please see initial visit note for detailed documentation.   Patient Care Team: JeSamuel BoucheNP as PCP - General (Nurse Practitioner) HoMarcial PacasDO (Family  Medicine) [facility] BumpRPL[facility] Pharmacist (Pharmacist)  Recent office visits:  12/14/20 JoSamuel BoucheP- pt was seen for a routine wellness examination. Pt advised to follow up as planned.   11/24/20 JoSamuel BoucheP- pt was seen for a annual physical examination. Labs were ordered and an xray of right foot was completed. Pt started on conjugated estrogens (PREMARIN) vaginal cream and advised to follow up in 6 months.   Recent consult visits:  None Noted   Hospital visits:  None in previous 6 months  Objective:  Lab Results  Component Value Date   CREATININE 1.09 (H) 05/17/2021   CREATININE 1.24 (H) 01/06/2021   CREATININE 1.01 (H) 11/24/2020       Component Value Date/Time   CHOL 165 05/17/2021 0000   TRIG 126 05/17/2021 0000   HDL 61 05/17/2021 0000   CHOLHDL 2.7 05/17/2021 0000   VLDL 33 (H) 10/03/2016 0826   LDLCALC 81 05/17/2021 0000    Hepatic Function Latest Ref Rng & Units 05/17/2021 01/06/2021 11/24/2020  Total Protein 6.1 - 8.1 g/dL 6.8 6.8 7.3  Albumin 3.5 - 5.0 g/dL - - -  AST 10 - 35 U/L '28 21 21  ' ALT 6 - 29 U/L '24 17 17  ' Alk Phosphatase 38 - 126 U/L - - -  Total Bilirubin 0.2 - 1.2 mg/dL 0.4 0.4 0.4  Bilirubin, Direct 0.0 - 0.2 mg/dL - - -    Lab Results  Component Value Date/Time   TSH 1.49 11/24/2020 12:00 AM   TSH 1.24 05/26/2020 10:11 AM   FREET4 1.30 07/26/2015 03:53 PM  CBC Latest Ref Rng & Units 05/17/2021 11/24/2020 05/26/2020  WBC 3.8 - 10.8 Thousand/uL 7.5 8.0 9.1  Hemoglobin 11.7 - 15.5 g/dL 14.4 15.0 14.9  Hematocrit 35.0 - 45.0 % 43.9 45.3(H) 45.4(H)  Platelets 140 - 400 Thousand/uL 307 263 319    Lab Results  Component Value Date/Time   VD25OH 34 10/21/2018 08:47 AM    Clinical ASCVD:  The 10-year ASCVD risk score (Arnett DK, et al., 2019) is: 10.7%   Values used to calculate the score:     Age: 20 years     Sex: Female     Is Non-Hispanic African American: No     Diabetic: No     Tobacco smoker: No     Systolic Blood  Pressure: 156 mmHg     Is BP treated: Yes     HDL Cholesterol: 61 mg/dL     Total Cholesterol: 165 mg/dL     Social History   Tobacco Use  Smoking Status Former   Packs/day: 1.00   Years: 25.00   Pack years: 25.00   Types: Cigarettes   Quit date: 07/03/1997   Years since quitting: 23.8  Smokeless Tobacco Never   BP Readings from Last 3 Encounters:  04/06/21 (!) 156/93  11/24/20 (!) 147/89  07/12/20 (!) 151/83   Pulse Readings from Last 3 Encounters:  04/06/21 61  11/24/20 82  07/12/20 74   Wt Readings from Last 3 Encounters:  11/24/20 160 lb 12.8 oz (72.9 kg)  07/12/20 154 lb 9.6 oz (70.1 kg)  06/28/20 155 lb (70.3 kg)    Assessment: Review of patient past medical history, allergies, medications, health status, including review of consultants reports, laboratory and other test data, was performed as part of comprehensive evaluation and provision of chronic care management services.   SDOH:  (Social Determinants of Health) assessments and interventions performed:    CCM Care Plan  Allergies  Allergen Reactions   Ibuprofen Shortness Of Breath   Amlodipine Swelling   Bee Venom Other (See Comments)   Lisinopril Swelling    Medications Reviewed Today     Reviewed by Darius Bump, [facility] (Pharmacist) on 04/29/21 at 1016  Med List Status: <None>   Medication Order Taking? Sig Documenting Provider Last Dose Status Informant  acetaminophen (TYLENOL) 650 MG CR tablet [id number] Yes Take 1 tablet (650 mg total) by mouth every 8 (eight) hours as needed for pain. Silverio Decamp, MD Taking Active            Med Note (GARNER, TIFFANY L   Wed Mar 17, 2020  1:32 PM)    albuterol (VENTOLIN HFA) 108 (90 Base) MCG/ACT inhaler [id number] No Inhale 1-2 puffs into the lungs every 6 (six) hours as needed for wheezing or shortness of breath.  Patient not taking: No sig reported   Tyrell Antonio Not Taking Active            Med Note Page Spiro   Tue Jun 22, 2020   9:36 AM) Never used per pt  aspirin EC 81 MG tablet [id number] Yes Take 1 tablet (81 mg total) by mouth daily. Marcial Pacas, DO Taking Active   atorvastatin (LIPITOR) 10 MG tablet [id number] Yes Take 1 tablet (10 mg total) by mouth daily. Samuel Bouche, NP Taking Active   Biotin w/ Vitamins C & E (HAIR/SKIN/NAILS PO) [id number] Yes Take by mouth daily. [provider] Taking Active   Calcium Carb-Cholecalciferol (CALCIUM + D3 PO) [id number] Yes Take by  mouth. [provider] Taking Active   conjugated estrogens (PREMARIN) vaginal cream [id number] Yes Use daily for 14 days then reduce to twice weekly. Samuel Bouche, NP Taking Active   hydrOXYzine (ATARAX/VISTARIL) 10 MG tablet [id number] No Take 1 tablet (10 mg total) by mouth 3 (three) times daily as needed.  Patient not taking: No sig reported   Samuel Bouche, NP Not Taking Active   MEGARED OMEGA-3 KRILL OIL PO [id number] Yes Take by mouth. [provider] Taking Active   metoprolol tartrate (LOPRESSOR) 50 MG tablet [id number] Yes Take 1 tablet (50 mg total) by mouth 2 (two) times daily. Samuel Bouche, NP Taking Active   Multiple Vitamin (MULTIVITAMIN) tablet [id number] Yes Take 1 tablet by mouth daily. [provider] Taking Active   omeprazole (PRILOSEC) 20 MG capsule [id number] Yes Take 20 mg by mouth daily. [provider] Taking Active   potassium chloride (KLOR-CON) 10 MEQ tablet [id number] Yes Take 1 tablet by mouth once daily Samuel Bouche, NP Taking Active   RESTASIS 0.05 % ophthalmic emulsion [id number] Yes 1 drop 2 (two) times daily. [provider] Taking Active   triamcinolone ointment (KENALOG) 0.1 % [id number] Yes Apply 1 application topically 2 (two) times daily. To affected areas Emeterio Reeve, DO Taking Active             Patient Active Problem List   Diagnosis Date Noted   Heart defect    AC (acromioclavicular) joint bone spurs 05/07/2020   High blood pressure 05/07/2020   Mild mitral  valve regurgitation 05/07/2020   Palpitations 03/17/2020   Chronic obstructive pulmonary disease with acute exacerbation ([place]) 03/04/2020   Health education 03/04/2020   Closed displaced fracture of proximal phalanx of third toe of right foot 12/30/2019   Flexural eczema 11/23/2016   Primary osteoarthritis of both knees 08/31/2016   Biceps tendinitis on right 06/22/2015   Serous otitis media 01/06/2015   Postmenopausal bleeding 08/17/2014   History of abnormal cervical Pap smear 06/19/2014   Lumbar strain 01/06/2014   Breast mass 07/11/2013   Encounter for colonoscopy due to history of colonic polyp 06/19/2013   TIA (transient ischemic attack) 06/10/2013   Essential hypertension, benign 06/10/2013   Septal defect 06/10/2013   Ocular migraine 06/10/2013   Situational anxiety 06/10/2013   Hyperlipidemia 06/10/2013   Right knee pain 06/10/2013    Immunization History  Administered Date(s) Administered   DTaP 01/31/2009   Influenza Split 05/17/2011, 05/29/2012   Influenza Whole 04/02/2013   Influenza,inj,Quad PF,6+ Mos 03/18/2014, 05/13/2015, 05/10/2016, 04/12/2017, 04/12/2018, 05/26/2019, 05/26/2020   Tdap 10/10/2016   Zoster Recombinat (Shingrix) 10/17/2016, 02/12/2017    Conditions to be addressed/monitored: HTN and HLD  There are no care plans that you recently modified to display for this patient.    Medication Assistance: None required.  Patient affirms current coverage meets needs.  Patient's preferred pharmacy is:  [facility] [address] [place] [address] Phone: [phone] Fax: [phone]  Uses pill box? Yes Pt endorses 100% compliance  Follow Up:  Patient agrees to Care Plan and Follow-up.  Plan: Telephone follow up appointment with care management team member scheduled for:  1 month  Larinda Buttery, PharmD Clinical Pharmacist [facility] [phone]

## 2021-05-20 NOTE — Patient Instructions (Signed)
Visit Information  Thank you for taking time to visit with me today. Please don't hesitate to contact me if I can be of assistance to you before our next scheduled telephone appointment.  Telephone follow up appointment with care management team member scheduled for: 1 month  If you need to cancel or re-schedule our visit, please call [phone] and our care guide team will be happy to assist you.  Following is a list of the goals we discussed today:  Patient Goals/Self-Care Activities Over the next 30 days, patient will:  take medications as prescribed and check blood pressure 2x per week, document, and provide at future appointments  Follow Up Plan: Telephone follow up appointment with care management team member scheduled for:  1 month  Patient verbalizes understanding of instructions provided today and agrees to view in MyChart.   Jahred Tatar J Tarris Delbene  

## 2021-05-23 ENCOUNTER — Encounter: Payer: Self-pay | Admitting: Medical-Surgical

## 2021-05-23 ENCOUNTER — Ambulatory Visit (INDEPENDENT_AMBULATORY_CARE_PROVIDER_SITE_OTHER): Payer: Medicare PPO | Admitting: Medical-Surgical

## 2021-05-23 ENCOUNTER — Other Ambulatory Visit: Payer: Self-pay

## 2021-05-23 VITALS — BP 177/95 | HR 75 | Resp 20 | Ht 64.5 in | Wt 164.6 lb

## 2021-05-23 DIAGNOSIS — I10 Essential (primary) hypertension: Secondary | ICD-10-CM

## 2021-05-23 DIAGNOSIS — F418 Other specified anxiety disorders: Secondary | ICD-10-CM | POA: Diagnosis not present

## 2021-05-23 DIAGNOSIS — R944 Abnormal results of kidney function studies: Secondary | ICD-10-CM | POA: Diagnosis not present

## 2021-05-23 DIAGNOSIS — E782 Mixed hyperlipidemia: Secondary | ICD-10-CM | POA: Diagnosis not present

## 2021-05-23 DIAGNOSIS — J449 Chronic obstructive pulmonary disease, unspecified: Secondary | ICD-10-CM | POA: Diagnosis not present

## 2021-05-23 MED ORDER — HYDROCHLOROTHIAZIDE 12.5 MG PO TABS: 12.5000 mg | ORAL_TABLET | Freq: Every day | ORAL | 3 refills | Status: DC

## 2021-05-23 NOTE — Progress Notes (Signed)
  HPI with pertinent ROS:   CC: Chronic disease follow-up  HPI: Pleasant 65 year old female presenting today for follow-up on:  Hypertension-taking carvedilol 12.5 mg twice daily as prescribed, tolerating well without side effects.  Has been following up with our clinical pharmacist on blood pressure control.  Unfortunately her blood pressures do tend to run higher and she has been sick recently with an upper respiratory viral illness followed by extraction of 4 teeth.  Wonders how accurate her blood pressure is right now with the recent stressors. Denies CP, SOB, palpitations, lower extremity edema, dizziness, headaches, or vision changes.  COPD-has her albuterol inhaler for as needed use but not currently have any big issues.  She does still have a lingering cough from her recent upper respiratory illness but this seems to be improving.  Hyperlipidemia-taking atorvastatin 10 mg daily, tolerating well without side effects.  Recent lipids checked and are looking good.  Anxiety-situational anxiety continues to be an issue however she is doing fairly well at this point.  Working to educate herself and asked questions when things are not clear to help resolve any anxious feelings.  I reviewed the past medical history, family history, social history, surgical history, and allergies today and no changes were needed.  Please see the problem list section below in epic for further details.   Physical exam:   General: Well Developed, well nourished, and in no acute distress.  Neuro: Alert and oriented x3.  HEENT: Normocephalic, atraumatic.  Skin: Warm and dry. Cardiac: Regular rate and rhythm, no murmurs rubs or gallops, no lower extremity edema.  Respiratory: Clear to auscultation bilaterally. Not using accessory muscles, speaking in full sentences.  Impression and Recommendations:    1. Essential hypertension, benign Unfortunately her blood pressure is significantly elevated today at 177/95.   Looking back at her recent home readings, recommend medication adjustment.  She is significantly worried about her heart rate going too low since her resting heart rate does dip into the upper 50s at home.  We are adding hydrochlorothiazide 12.5 mg daily to her carvedilol 12.5 mg twice daily.  Continue monitoring blood pressure at home.  Continue low-sodium diet.  2. Chronic obstructive pulmonary disease without acute exacerbation (HCC) Continue albuterol inhaler as needed.  3. Mixed hyperlipidemia Continue atorvastatin 10 mg daily.  4. Situational anxiety Controlled without medication.  Has hydroxyzine at home for significant issues.  5. Decreased GFR Discussed decreased GFR and slight elevation of creatinine.  This is at a stable point for her and she is aware to avoid nephrotoxic medications and to stay well-hydrated.  Return in about 2 weeks (around 06/06/2021) for nurse visit for BP check/flu shot. ___________________________________________ Thayer Ohm, DNP, APRN, FNP-BC Primary Care and Sports Medicine [facility]

## 2021-06-01 DIAGNOSIS — E782 Mixed hyperlipidemia: Secondary | ICD-10-CM

## 2021-06-01 DIAGNOSIS — I10 Essential (primary) hypertension: Secondary | ICD-10-CM

## 2021-06-06 ENCOUNTER — Other Ambulatory Visit: Payer: Self-pay

## 2021-06-06 ENCOUNTER — Other Ambulatory Visit: Payer: Self-pay | Admitting: Medical-Surgical

## 2021-06-06 ENCOUNTER — Ambulatory Visit (INDEPENDENT_AMBULATORY_CARE_PROVIDER_SITE_OTHER): Payer: Medicare PPO | Admitting: Medical-Surgical

## 2021-06-06 VITALS — BP 156/87 | HR 90

## 2021-06-06 DIAGNOSIS — Z23 Encounter for immunization: Secondary | ICD-10-CM | POA: Diagnosis not present

## 2021-06-06 MED ORDER — HYDROCHLOROTHIAZIDE 25 MG PO TABS: 25.0000 mg | ORAL_TABLET | Freq: Every day | ORAL | 1 refills | Status: DC

## 2021-06-06 NOTE — Progress Notes (Signed)
Blood pressures still not at goal.  Increasing hydrochlorothiazide to 25 mg daily.  New prescription sent to pharmacy but okay to use 2 of the 12.5 mg tablets daily since she already has at home.  Return in 2 weeks for blood pressure check.  ___________________________________________ Thayer Ohm, DNP, APRN, FNP-BC Primary Care and Sports Medicine [facility] [place]

## 2021-06-06 NOTE — Progress Notes (Signed)
Pt here for influenza vaccine.  Screening questionnaire reviewed, VIS provided to patient, and any/all patient questions answered.  Pt requested not to receive the 65+ dosage and wished to receive the 6mo+ dosage.  The regular dose of flu vaccine was administered.    Pt also here for nurse BP check.  Pt denies CP, SOB, headaches, dizziness or missed doses of medications.  Pt brought in home BP readings.  They are   AM:  145/98, 148/94, 152/86, 139/90, 141/96, 155/93, 155/87  PM:  152/93, 149/98, 144/93, 151/101       T. Delton See, CMA

## 2021-06-08 ENCOUNTER — Other Ambulatory Visit: Payer: Self-pay

## 2021-06-08 MED ORDER — AMOXICILLIN 500 MG PO CAPS: 2000.0000 mg | ORAL_CAPSULE | Freq: Once | ORAL | 3 refills | Status: AC

## 2021-06-08 MED ORDER — AMOXICILLIN 500 MG PO CAPS: 2000.0000 mg | ORAL_CAPSULE | Freq: Once | ORAL | 0 refills | Status: DC

## 2021-06-08 NOTE — Telephone Encounter (Signed)
Pt aware via voicemail that the Rx has been sent to the pharmacy. Instructed patient to call back if there are any questions or concerns.   

## 2021-06-08 NOTE — Telephone Encounter (Signed)
Prescription sent with refills

## 2021-06-08 NOTE — Telephone Encounter (Signed)
Pt called in and said that she had to have dental work this morning and the dentist told her that each time she has to have work done she has to take amoxicillin (AMOXIL) 500 MG capsule before the appt. She had an appt this morning and was told that she would have to go back for 4 additional appts for more work. Pt is requesting a Rx for amoxicillin (AMOXIL) 500 MG capsule to be sent to her pharmacy either dispensing 16 capsules or dispensing 4 capsules with 3 refills. Rx has been tee'd up below and ready for review and approval/denial.

## 2021-07-08 ENCOUNTER — Ambulatory Visit (INDEPENDENT_AMBULATORY_CARE_PROVIDER_SITE_OTHER): Payer: Medicare PPO | Admitting: Pharmacist

## 2021-07-08 ENCOUNTER — Other Ambulatory Visit: Payer: Self-pay

## 2021-07-08 DIAGNOSIS — E1169 Type 2 diabetes mellitus with other specified complication: Secondary | ICD-10-CM

## 2021-07-08 DIAGNOSIS — I10 Essential (primary) hypertension: Secondary | ICD-10-CM

## 2021-07-08 NOTE — Progress Notes (Signed)
Chronic Care Management Pharmacy Note  07/08/2021 Name:  Lindsay Moore MRN:  [id number] DOB:  1956/03/22  Summary: addressed HTN, HLD. Patient has been sick for a week with what seems to be a respiratory virus. Reports she has lost  about 10lbs unintentionally.   BP readings as follows:  114/75, occasionally feeling light-headed Highest 148/80 Usually 121/75, 120/82, 104/80, 116/80, 114/79  Recommendations/Changes made from today's visit:  No medication changes at this time. Provided counseling and reassurance on blood pressure numbers, answered question about what is considered "too low" - in which BP 90/60s WITH symptoms would be an appropriate time to call the office for advice on low pressure/medication adjustments.  However, suspect the very occasional symptoms were related to her viral illness and will pass. Encouraged fluids.   Plan: f/u with pharmacist in 1 month  Subjective: Lindsay Moore is an 66 y.o. year old female who is a primary patient of Samuel Bouche, NP.  The CCM team was consulted for assistance with disease management and care coordination needs.    Engaged with patient by telephone for follow up visit in response to provider referral for pharmacy case management and/or care coordination services.   Consent to Services:  The patient was given information about Chronic Care Management services, agreed to services, and gave verbal consent prior to initiation of services.  Please see initial visit note for detailed documentation.   Patient Care Team: Samuel Bouche, NP as PCP - General (Nurse Practitioner) Marcial Pacas, DO (Family Medicine) Darius Bump, [facility] as Pharmacist (Pharmacist)  Recent office visits:  12/14/20 Samuel Bouche NP- pt was seen for a routine wellness examination. Pt advised to follow up as planned.   11/24/20 Samuel Bouche NP- pt was seen for a annual physical examination. Labs were ordered and an xray of right foot was completed. Pt  started on conjugated estrogens (PREMARIN) vaginal cream and advised to follow up in 6 months.   Recent consult visits:  None Noted   Hospital visits:  None in previous 6 months  Objective:  Lab Results  Component Value Date   CREATININE 1.09 (H) 05/17/2021   CREATININE 1.24 (H) 01/06/2021   CREATININE 1.01 (H) 11/24/2020       Component Value Date/Time   CHOL 165 05/17/2021 0000   TRIG 126 05/17/2021 0000   HDL 61 05/17/2021 0000   CHOLHDL 2.7 05/17/2021 0000   VLDL 33 (H) 10/03/2016 0826   LDLCALC 81 05/17/2021 0000    Hepatic Function Latest Ref Rng & Units 05/17/2021 01/06/2021 11/24/2020  Total Protein 6.1 - 8.1 g/dL 6.8 6.8 7.3  Albumin 3.5 - 5.0 g/dL - - -  AST 10 - 35 U/L _0 ALT 6 - 29 U/L _1 Alk Phosphatase 38 - 126 U/L - - -  Total Bilirubin 0.2 - 1.2 mg/dL 0.4 0.4 0.4  Bilirubin, Direct 0.0 - 0.2 mg/dL - - -    Lab Results  Component Value Date/Time   TSH 1.49 11/24/2020 12:00 AM   TSH 1.24 05/26/2020 10:11 AM   FREET4 1.30 07/26/2015 03:53 PM    CBC Latest Ref Rng & Units 05/17/2021 11/24/2020 05/26/2020  WBC 3.8 - 10.8 Thousand/uL 7.5 8.0 9.1  Hemoglobin 11.7 - 15.5 g/dL 14.4 15.0 14.9  Hematocrit 35.0 - 45.0 % 43.9 45.3(H) 45.4(H)  Platelets 140 - 400 Thousand/uL 307 263 319    Lab Results  Component Value Date/Time   VD25OH 34 10/21/2018 08:47 AM  Clinical ASCVD:  The 10-year ASCVD risk score (Arnett DK, et al., 2019) is: 10.7%   Values used to calculate the score:     Age: 66 years     Sex: Female     Is Non-Hispanic African American: No     Diabetic: No     Tobacco smoker: No     Systolic Blood Pressure: 409 mmHg     Is BP treated: Yes     HDL Cholesterol: 61 mg/dL     Total Cholesterol: 165 mg/dL     Social History   Tobacco Use  Smoking Status Former   Packs/day: 1.00   Years: 25.00   Pack years: 25.00   Types: Cigarettes   Quit date: 07/03/1997   Years since quitting: 24.0  Smokeless Tobacco Never   BP  Readings from Last 3 Encounters:  06/06/21 (!) 156/87  05/23/21 (!) 177/95  04/06/21 (!) 156/93   Pulse Readings from Last 3 Encounters:  06/06/21 90  05/23/21 75  04/06/21 61   Wt Readings from Last 3 Encounters:  05/23/21 164 lb 9.6 oz (74.7 kg)  11/24/20 160 lb 12.8 oz (72.9 kg)  07/12/20 154 lb 9.6 oz (70.1 kg)    Assessment: Review of patient past medical history, allergies, medications, health status, including review of consultants reports, laboratory and other test data, was performed as part of comprehensive evaluation and provision of chronic care management services.   SDOH:  (Social Determinants of Health) assessments and interventions performed:    CCM Care Plan  Allergies  Allergen Reactions   Ibuprofen Shortness Of Breath   Amlodipine Swelling   Bee Venom Other (See Comments)   Lisinopril Swelling    Medications Reviewed Today     Reviewed by Darius Bump, [facility] (Pharmacist) on 07/08/21 at [place] List Status: <None>   Medication Order Taking? Sig Documenting Provider Last Dose Status Informant  acetaminophen (TYLENOL) 650 MG CR tablet [id number] Yes Take 1 tablet (650 mg total) by mouth every 8 (eight) hours as needed for pain. Silverio Decamp, MD Taking Active            Med Note (GARNER, TIFFANY L   Wed Mar 17, 2020  1:32 PM)    albuterol (VENTOLIN HFA) 108 (90 Base) MCG/ACT inhaler [id number] No Inhale 1-2 puffs into the lungs every 6 (six) hours as needed for wheezing or shortness of breath.  Patient not taking: Reported on 07/08/2021   Tyrell Antonio Not Taking Active            Med Note Page Spiro   Tue Jun 22, 2020  9:36 AM) Never used per pt  aspirin EC 81 MG tablet [id number] Yes Take 1 tablet (81 mg total) by mouth daily. Marcial Pacas, DO Taking Active   atorvastatin (LIPITOR) 10 MG tablet [id number] Yes Take 1 tablet (10 mg total) by mouth daily. Samuel Bouche, NP Taking Active   Biotin w/ Vitamins C & E (HAIR/SKIN/NAILS PO)  [id number] Yes Take by mouth daily. [provider] Taking Active   Calcium Carb-Cholecalciferol (CALCIUM + D3 PO) [id number] Yes Take by mouth. [provider] Taking Active   carvedilol (COREG) 12.5 MG tablet [id number] Yes Take 1 tablet (12.5 mg total) by mouth 2 (two) times daily with a meal. Samuel Bouche, NP Taking Active   conjugated estrogens (PREMARIN) vaginal cream [id number] Yes Use daily for 14 days then reduce to twice weekly. Samuel Bouche, NP Taking Active   hydrochlorothiazide (  HYDRODIURIL) 25 MG tablet [id number] Yes Take 1 tablet (25 mg total) by mouth daily. Samuel Bouche, NP Taking Active   hydrOXYzine (ATARAX/VISTARIL) 10 MG tablet [id number] Yes Take 1 tablet (10 mg total) by mouth 3 (three) times daily as needed. Samuel Bouche, NP Taking Active   MEGARED OMEGA-3 KRILL OIL PO [id number] Yes Take by mouth. [provider] Taking Active   Multiple Vitamin (MULTIVITAMIN) tablet [id number] Yes Take 1 tablet by mouth daily. [provider] Taking Active   omeprazole (PRILOSEC) 20 MG capsule [id number] Yes Take 20 mg by mouth daily. [provider] Taking Active   potassium chloride (KLOR-CON M) 10 MEQ tablet [id number] Yes Take 1 tablet by mouth once daily Samuel Bouche, NP Taking Active   potassium chloride (KLOR-CON) 10 MEQ tablet [id number] Yes Take 1 tablet by mouth once daily Samuel Bouche, NP Taking Active   RESTASIS 0.05 % ophthalmic emulsion [id number] Yes 1 drop 2 (two) times daily. [provider] Taking Active   triamcinolone ointment (KENALOG) 0.1 % [id number] Yes Apply 1 application topically 2 (two) times daily. To affected areas Emeterio Reeve, DO Taking Active             Patient Active Problem List   Diagnosis Date Noted   Heart defect    AC (acromioclavicular) joint bone spurs 05/07/2020   Mild mitral valve regurgitation 05/07/2020   Palpitations 03/17/2020   Chronic obstructive pulmonary disease with acute exacerbation ([place])  03/04/2020   Closed displaced fracture of proximal phalanx of third toe of right foot 12/30/2019   Flexural eczema 11/23/2016   Primary osteoarthritis of both knees 08/31/2016   Biceps tendinitis on right 06/22/2015   Serous otitis media 01/06/2015   Postmenopausal bleeding 08/17/2014   History of abnormal cervical Pap smear 06/19/2014   Lumbar strain 01/06/2014   Breast mass 07/11/2013   Encounter for colonoscopy due to history of colonic polyp 06/19/2013   TIA (transient ischemic attack) 06/10/2013   Essential hypertension, benign 06/10/2013   Septal defect 06/10/2013   Ocular migraine 06/10/2013   Situational anxiety 06/10/2013   Hyperlipidemia 06/10/2013   Right knee pain 06/10/2013    Immunization History  Administered Date(s) Administered   DTaP 01/31/2009   Influenza Split 05/17/2011, 05/29/2012   Influenza Whole 04/02/2013   Influenza,inj,Quad PF,6+ Mos 03/18/2014, 05/13/2015, 05/10/2016, 04/12/2017, 04/12/2018, 05/26/2019, 05/26/2020, 06/06/2021   Tdap 10/10/2016   Zoster Recombinat (Shingrix) 10/17/2016, 02/12/2017    Conditions to be addressed/monitored: HTN and HLD  Care Plan : Medication Management  Updates made by Darius Bump, [place] since 07/08/2021 12:00 AM     Problem: HTN, HLD      Long-Range Goal: Disease Progression Prevention   Start Date: 04/06/2021  Recent Progress: On track  Priority: High  Note:    Current Barriers:  None at present  Pharmacist Clinical Goal(s):  Over the next 30 days, patient will adhere to plan to optimize therapeutic regimen for hypertension as evidenced by report of adherence to recommended medication management changes through collaboration with PharmD and provider.   Interventions: 1:1 collaboration with Samuel Bouche, NP regarding development and update of comprehensive plan of care as evidenced by provider attestation and co-signature Inter-disciplinary care team collaboration (see longitudinal plan of  care) Comprehensive medication review performed; medication list updated in electronic medical record  Hypertension:  Controlled; current treatment: carvedilol 12.36m BID, hctz 289mdaily  Current home readings:   114/75, occasionally feeling light-headed Highest 148/80 Usually 121/75, 120/82, 104/80, 116/80, 114/79   Denies  hypotensive/hypertensive symptoms  Counseled on goal SBP <130/80 Recommended continue current regimen  Hyperlipidemia:  Controlled; current treatment:atorvastatin 73m daily, megared omega 3 krill oil; LDL 72  Recommended continue current regimen  Patient Goals/Self-Care Activities Over the next 30 days, patient will:  take medications as prescribed and check blood pressure 2x per week, document, and provide at future appointments  Follow Up Plan: Telephone follow up appointment with care management team member scheduled for:  1 month        Medication Assistance: None required.  Patient affirms current coverage meets needs.  [facility] [place] [place]Phone: [phone]: [phone]  Uses pill box? Yes Pt endorses 100% compliance  Follow Up:  Patient agrees to Care Plan and Follow-up.  Plan: Telephone follow up appointment with care management team member scheduled for:  1 month  KLarinda Buttery PharmD Clinical Pharmacist [phone]

## 2021-07-08 NOTE — Patient Instructions (Signed)
Visit Information  Thank you for taking time to visit with me today. Please don't hesitate to contact me if I can be of assistance to you before our next scheduled telephone appointment.  Following are the goals we discussed today:   Patient Goals/Self-Care Activities Over the next 30 days, patient will:  take medications as prescribed and check blood pressure 2x per week, document, and provide at future appointments  Follow Up Plan: Telephone follow up appointment with care management team member scheduled for:  1 month   Please call the care guide team at [phone] if you need to cancel or reschedule your appointment.    Patient verbalizes understanding of instructions provided today and agrees to view in MyChart.   Gabriel Carina

## 2021-07-22 ENCOUNTER — Other Ambulatory Visit: Payer: Self-pay | Admitting: Medical-Surgical

## 2021-07-22 DIAGNOSIS — Z1231 Encounter for screening mammogram for malignant neoplasm of breast: Secondary | ICD-10-CM

## 2021-08-01 ENCOUNTER — Ambulatory Visit
Admission: RE | Admit: 2021-08-01 | Discharge: 2021-08-01 | Disposition: A | Discharge status: Home / Self Care | Payer: Medicare PPO | Source: Ambulatory Visit | Attending: Medical-Surgical | Admitting: Medical-Surgical

## 2021-08-01 DIAGNOSIS — Z1231 Encounter for screening mammogram for malignant neoplasm of breast: Secondary | ICD-10-CM | POA: Diagnosis not present

## 2021-08-02 DIAGNOSIS — E785 Hyperlipidemia, unspecified: Secondary | ICD-10-CM

## 2021-08-02 DIAGNOSIS — E1169 Type 2 diabetes mellitus with other specified complication: Secondary | ICD-10-CM | POA: Diagnosis not present

## 2021-08-02 DIAGNOSIS — I10 Essential (primary) hypertension: Secondary | ICD-10-CM

## 2021-08-06 ENCOUNTER — Other Ambulatory Visit: Payer: Self-pay | Admitting: Medical-Surgical

## 2021-08-12 ENCOUNTER — Ambulatory Visit (INDEPENDENT_AMBULATORY_CARE_PROVIDER_SITE_OTHER): Payer: Medicare PPO | Admitting: Pharmacist

## 2021-08-12 ENCOUNTER — Other Ambulatory Visit: Payer: Self-pay

## 2021-08-12 DIAGNOSIS — I10 Essential (primary) hypertension: Secondary | ICD-10-CM

## 2021-08-12 DIAGNOSIS — E1169 Type 2 diabetes mellitus with other specified complication: Secondary | ICD-10-CM

## 2021-08-12 NOTE — Progress Notes (Signed)
Chronic Care Management Pharmacy Note  08/13/2021 Name:  Lindsay Moore MRN:  [id number] DOB:  03/14/1956  Summary: addressed HTN, HLD. Patient states BP readings from home have stayed mostly 140s/ 80-90s  Recommendations/Changes made from today's visit:  No medication changes at this time.  Plan: f/u with pharmacist in 3-5 months  Subjective: Lindsay Moore is an 66 y.o. year old female who is a primary patient of Samuel Bouche, NP.  The CCM team was consulted for assistance with disease management and care coordination needs.    Engaged with patient by telephone for follow up visit in response to provider referral for pharmacy case management and/or care coordination services.   Consent to Services:  The patient was given information about Chronic Care Management services, agreed to services, and gave verbal consent prior to initiation of services.  Please see initial visit note for detailed documentation.   Patient Care Team: Samuel Bouche, NP as PCP - General (Nurse Practitioner) Marcial Pacas, DO (Family Medicine) Darius Bump, [facility] as Pharmacist (Pharmacist)  Recent office visits:  12/14/20 Samuel Bouche NP- pt was seen for a routine wellness examination. Pt advised to follow up as planned.   11/24/20 Samuel Bouche NP- pt was seen for a annual physical examination. Labs were ordered and an xray of right foot was completed. Pt started on conjugated estrogens (PREMARIN) vaginal cream and advised to follow up in 6 months.   Recent consult visits:  None Noted   Hospital visits:  None in previous 6 months  Objective:  Lab Results  Component Value Date   CREATININE 1.09 (H) 05/17/2021   CREATININE 1.24 (H) 01/06/2021   CREATININE 1.01 (H) 11/24/2020       Component Value Date/Time   CHOL 165 05/17/2021 0000   TRIG 126 05/17/2021 0000   HDL 61 05/17/2021 0000   CHOLHDL 2.7 05/17/2021 0000   VLDL 33 (H) 10/03/2016 0826   LDLCALC 81 05/17/2021 0000     Hepatic Function Latest Ref Rng & Units 05/17/2021 01/06/2021 11/24/2020  Total Protein 6.1 - 8.1 g/dL 6.8 6.8 7.3  Albumin 3.5 - 5.0 g/dL - - -  AST 10 - 35 U/L _0 ALT 6 - 29 U/L _1 Alk Phosphatase 38 - 126 U/L - - -  Total Bilirubin 0.2 - 1.2 mg/dL 0.4 0.4 0.4  Bilirubin, Direct 0.0 - 0.2 mg/dL - - -    Lab Results  Component Value Date/Time   TSH 1.49 11/24/2020 12:00 AM   TSH 1.24 05/26/2020 10:11 AM   FREET4 1.30 07/26/2015 03:53 PM    CBC Latest Ref Rng & Units 05/17/2021 11/24/2020 05/26/2020  WBC 3.8 - 10.8 Thousand/uL 7.5 8.0 9.1  Hemoglobin 11.7 - 15.5 g/dL 14.4 15.0 14.9  Hematocrit 35.0 - 45.0 % 43.9 45.3(H) 45.4(H)  Platelets 140 - 400 Thousand/uL 307 263 319    Lab Results  Component Value Date/Time   VD25OH 34 10/21/2018 08:47 AM    Clinical ASCVD:  The 10-year ASCVD risk score (Arnett DK, et al., 2019) is: 19.6%   Values used to calculate the score:     Age: 46 years     Sex: Female     Is Non-Hispanic African American: No     Diabetic: Yes     Tobacco smoker: No     Systolic Blood Pressure: 166 mmHg     Is BP treated: Yes     HDL Cholesterol: 61 mg/dL  Total Cholesterol: 165 mg/dL     Social History   Tobacco Use  Smoking Status Former   Packs/day: 1.00   Years: 25.00   Pack years: 25.00   Types: Cigarettes   Quit date: 07/03/1997   Years since quitting: 24.1  Smokeless Tobacco Never   BP Readings from Last 3 Encounters:  06/06/21 (!) 156/87  05/23/21 (!) 177/95  04/06/21 (!) 156/93   Pulse Readings from Last 3 Encounters:  06/06/21 90  05/23/21 75  04/06/21 61   Wt Readings from Last 3 Encounters:  05/23/21 164 lb 9.6 oz (74.7 kg)  11/24/20 160 lb 12.8 oz (72.9 kg)  07/12/20 154 lb 9.6 oz (70.1 kg)    Assessment: Review of patient past medical history, allergies, medications, health status, including review of consultants reports, laboratory and other test data, was performed as part of comprehensive evaluation  and provision of chronic care management services.   SDOH:  (Social Determinants of Health) assessments and interventions performed:    CCM Care Plan  Allergies  Allergen Reactions   Ibuprofen Shortness Of Breath   Amlodipine Swelling   Bee Venom Other (See Comments)   Lisinopril Swelling    Medications Reviewed Today     Reviewed by Darius Bump, [facility] (Pharmacist) on 08/12/21 at 1013  Med List Status: <None>   Medication Order Taking? Sig Documenting Provider Last Dose Status Informant  acetaminophen (TYLENOL) 650 MG CR tablet [id number] Yes Take 1 tablet (650 mg total) by mouth every 8 (eight) hours as needed for pain. Silverio Decamp, MD Taking Active            Med Note (GARNER, TIFFANY L   Wed Mar 17, 2020  1:32 PM)    albuterol (VENTOLIN HFA) 108 (90 Base) MCG/ACT inhaler [id number] No Inhale 1-2 puffs into the lungs every 6 (six) hours as needed for wheezing or shortness of breath.  Patient not taking: Reported on 07/08/2021   Tyrell Antonio Not Taking Active            Med Note Page Spiro   Tue Jun 22, 2020  9:36 AM) Never used per pt  aspirin EC 81 MG tablet [id number] Yes Take 1 tablet (81 mg total) by mouth daily. Marcial Pacas, DO Taking Active   atorvastatin (LIPITOR) 10 MG tablet [id number] Yes Take 1 tablet (10 mg total) by mouth daily. Samuel Bouche, NP Taking Active   Biotin w/ Vitamins C & E (HAIR/SKIN/NAILS PO) [id number] Yes Take by mouth daily. [provider] Taking Active   Calcium Carb-Cholecalciferol (CALCIUM + D3 PO) [id number] Yes Take by mouth. [provider] Taking Active   carvedilol (COREG) 12.5 MG tablet [id number] Yes Take 1 tablet (12.5 mg total) by mouth 2 (two) times daily with a meal. Samuel Bouche, NP Taking Active   conjugated estrogens (PREMARIN) vaginal cream [id number] Yes Use daily for 14 days then reduce to twice weekly. Samuel Bouche, NP Taking Active   hydrochlorothiazide (HYDRODIURIL) 25 MG tablet [id number] Yes  Take 1 tablet by mouth once daily Samuel Bouche, NP Taking Active   hydrOXYzine (ATARAX/VISTARIL) 10 MG tablet [id number] Yes Take 1 tablet (10 mg total) by mouth 3 (three) times daily as needed. Samuel Bouche, NP Taking Active   MEGARED OMEGA-3 KRILL OIL PO [id number] Yes Take by mouth. [provider] Taking Active   Multiple Vitamin (MULTIVITAMIN) tablet [id number] Yes Take 1 tablet by mouth daily. [provider] Taking Active   omeprazole (PRILOSEC) 20  MG capsule [id number] Yes Take 20 mg by mouth daily. [provider] Taking Active   potassium chloride (KLOR-CON M) 10 MEQ tablet [id number] Yes Take 1 tablet by mouth once daily Samuel Bouche, NP Taking Active   potassium chloride (KLOR-CON) 10 MEQ tablet [id number]  Take 1 tablet by mouth once daily Samuel Bouche, NP  Active   RESTASIS 0.05 % ophthalmic emulsion [id number] Yes 1 drop 2 (two) times daily. [provider] Taking Active   triamcinolone ointment (KENALOG) 0.1 % [id number] Yes Apply 1 application topically 2 (two) times daily. To affected areas Emeterio Reeve, DO Taking Active             Patient Active Problem List   Diagnosis Date Noted   Heart defect    AC (acromioclavicular) joint bone spurs 05/07/2020   Mild mitral valve regurgitation 05/07/2020   Palpitations 03/17/2020   Chronic obstructive pulmonary disease with acute exacerbation ([place]) 03/04/2020   Closed displaced fracture of proximal phalanx of third toe of right foot 12/30/2019   Flexural eczema 11/23/2016   Primary osteoarthritis of both knees 08/31/2016   Biceps tendinitis on right 06/22/2015   Serous otitis media 01/06/2015   Postmenopausal bleeding 08/17/2014   History of abnormal cervical Pap smear 06/19/2014   Lumbar strain 01/06/2014   Breast mass 07/11/2013   Encounter for colonoscopy due to history of colonic polyp 06/19/2013   TIA (transient ischemic attack) 06/10/2013   Essential hypertension, benign 06/10/2013    Septal defect 06/10/2013   Ocular migraine 06/10/2013   Situational anxiety 06/10/2013   Hyperlipidemia 06/10/2013   Right knee pain 06/10/2013    Immunization History  Administered Date(s) Administered   DTaP 01/31/2009   Influenza Split 05/17/2011, 05/29/2012   Influenza Whole 04/02/2013   Influenza,inj,Quad PF,6+ Mos 03/18/2014, 05/13/2015, 05/10/2016, 04/12/2017, 04/12/2018, 05/26/2019, 05/26/2020, 06/06/2021   Tdap 10/10/2016   Zoster Recombinat (Shingrix) 10/17/2016, 02/12/2017    Conditions to be addressed/monitored: HTN and HLD  Care Plan : Medication Management  Updates made by Darius Bump, [place] since 08/13/2021 12:00 AM     Problem: HTN, HLD      Long-Range Goal: Disease Progression Prevention   Start Date: 04/06/2021  Recent Progress: On track  Priority: High  Note:    Current Barriers:  None at present  Pharmacist Clinical Goal(s):  Over the next 90 days, patient will adhere to plan to optimize therapeutic regimen for hypertension as evidenced by report of adherence to recommended medication management changes through collaboration with PharmD and provider.   Interventions: 1:1 collaboration with Samuel Bouche, NP regarding development and update of comprehensive plan of care as evidenced by provider attestation and co-signature Inter-disciplinary care team collaboration (see longitudinal plan of care) Comprehensive medication review performed; medication list updated in electronic medical record  Hypertension:  Controlled; current treatment: carvedilol 12.7m BID, hctz 264mdaily  Current home readings: usually 140s/80-90s  Denies hypotensive/hypertensive symptoms  Counseled on goal SBP <130/80 Recommended continue current regimen  Hyperlipidemia:  Controlled; current treatment:atorvastatin 1047maily, megared omega 3 krill oil; LDL 72  Recommended continue current regimen  Patient Goals/Self-Care Activities Over the next 90 days, patient will:   take medications as prescribed and check blood pressure 2x per week, document, and provide at future appointments  Follow Up Plan: Telephone follow up appointment with care management team member scheduled for:  3-5 months         Medication Assistance: None required.  Patient affirms current coverage meets needs.  Patient's preferred pharmacy  is:  [facility], [place] [address] Phone: [phone] Fax: [phone]   Uses pill box? Yes Pt endorses 100% compliance  Follow Up:  Patient agrees to Care Plan and Follow-up.  Plan: Telephone follow up appointment with care management team member scheduled for:  3-5 months  Larinda Buttery, PharmD Clinical Pharmacist [facility] [phone]

## 2021-08-13 NOTE — Patient Instructions (Signed)
Visit Information  Thank you for taking time to visit with me today. Please don't hesitate to contact me if I can be of assistance to you before our next scheduled telephone appointment.  Following are the goals we discussed today:   Patient Goals/Self-Care Activities Over the next 90 days, patient will:  take medications as prescribed and check blood pressure 2x per week, document, and provide at future appointments  Follow Up Plan: Telephone follow up appointment with care management team member scheduled for:  3-5 months   Please call the care guide team at [phone] if you need to cancel or reschedule your appointment.   Patient verbalizes understanding of instructions and care plan provided today and agrees to view in MyChart. Active MyChart status confirmed with patient.    Lindsay Moore

## 2021-08-26 ENCOUNTER — Other Ambulatory Visit: Payer: Self-pay | Admitting: Medical-Surgical

## 2021-08-30 DIAGNOSIS — E785 Hyperlipidemia, unspecified: Secondary | ICD-10-CM

## 2021-08-30 DIAGNOSIS — E1169 Type 2 diabetes mellitus with other specified complication: Secondary | ICD-10-CM

## 2021-08-30 DIAGNOSIS — I10 Essential (primary) hypertension: Secondary | ICD-10-CM

## 2021-08-31 ENCOUNTER — Other Ambulatory Visit: Payer: Self-pay | Admitting: Medical-Surgical

## 2021-09-02 ENCOUNTER — Encounter: Payer: Self-pay | Admitting: Medical-Surgical

## 2021-09-05 NOTE — Telephone Encounter (Signed)
Patient has been scheduled on the same day as her husband. AM ?

## 2021-09-09 IMAGING — DX DG ORBITS FOR FOREIGN BODY
2 series · 2 of 2 positions shown · non-contrast
Comparison: None.

CLINICAL DATA: Metal working/exposure; clearance prior to MRI

EXAM:
ORBITS FOR FOREIGN BODY - 2 VIEW

[orbits waters (1 of 2)]
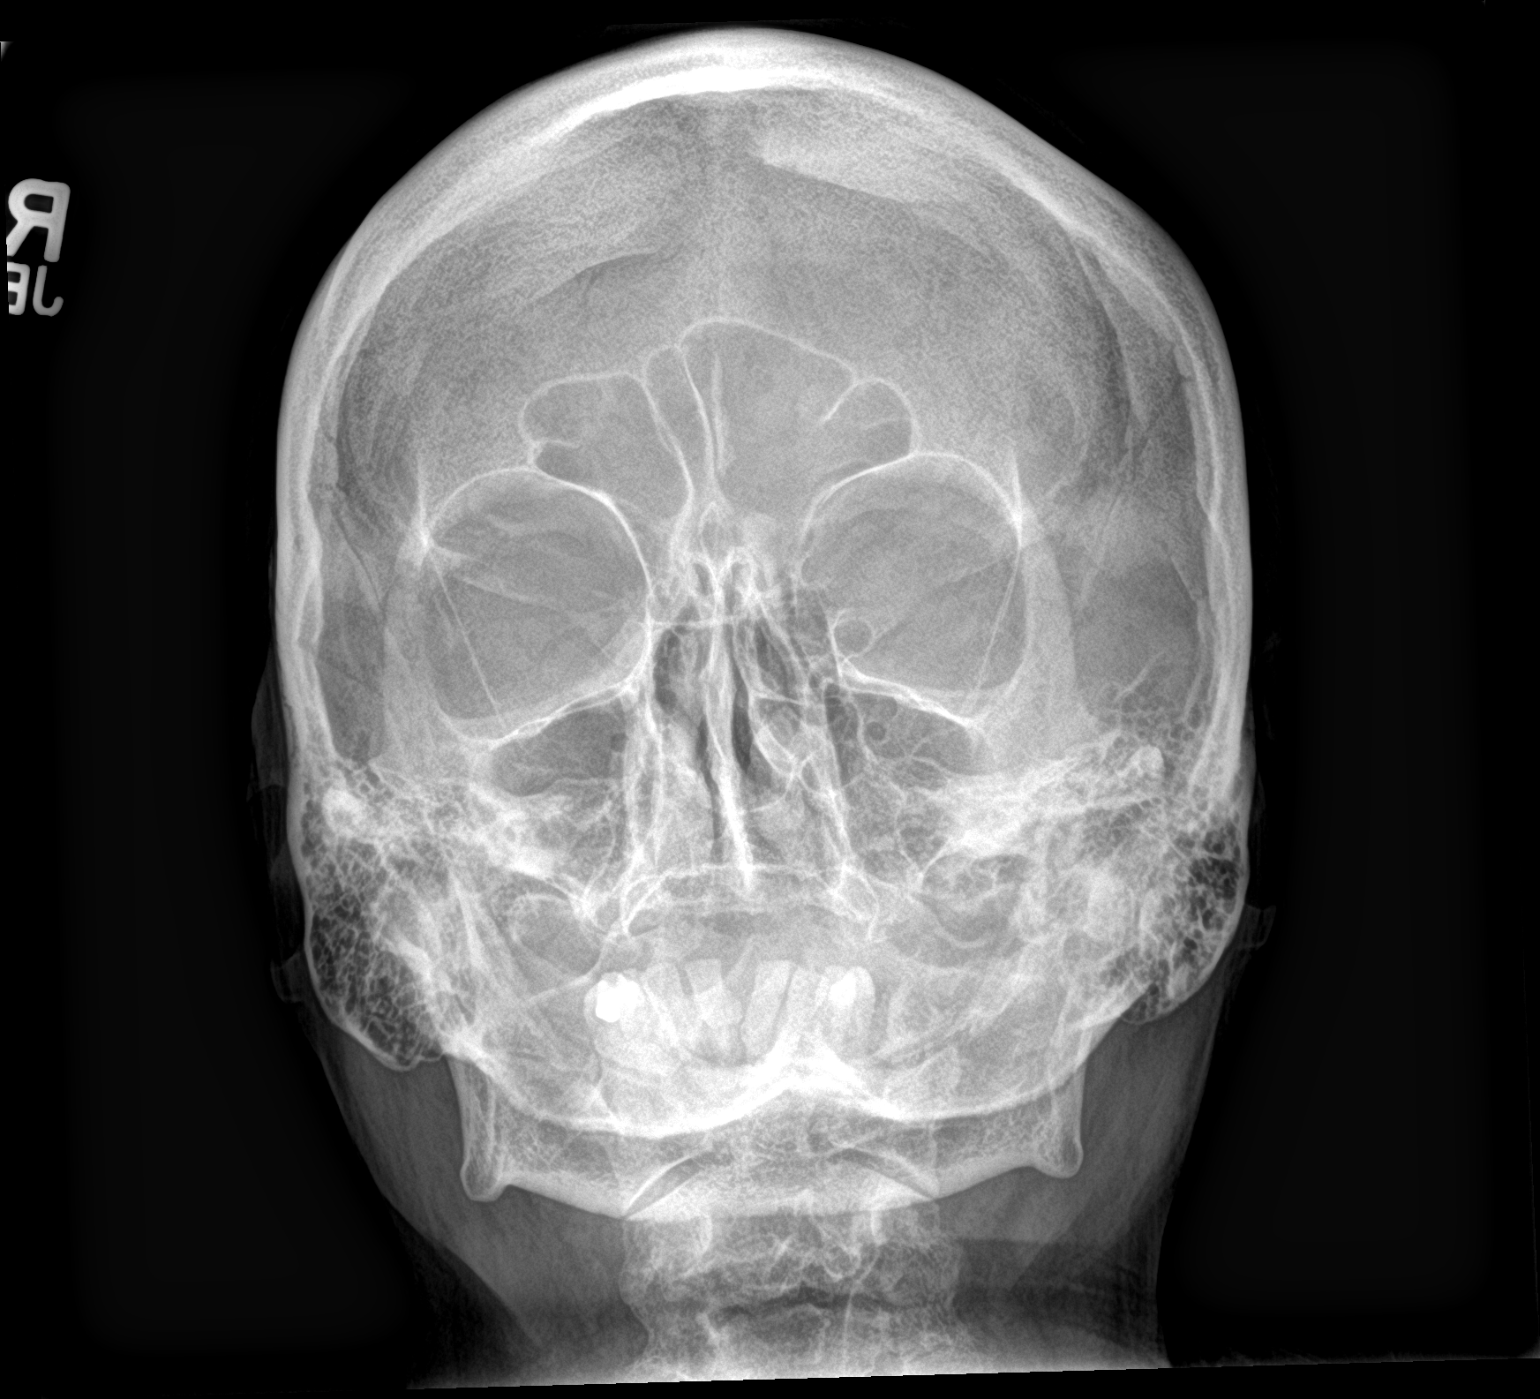

[orbits waters (2 of 2)]
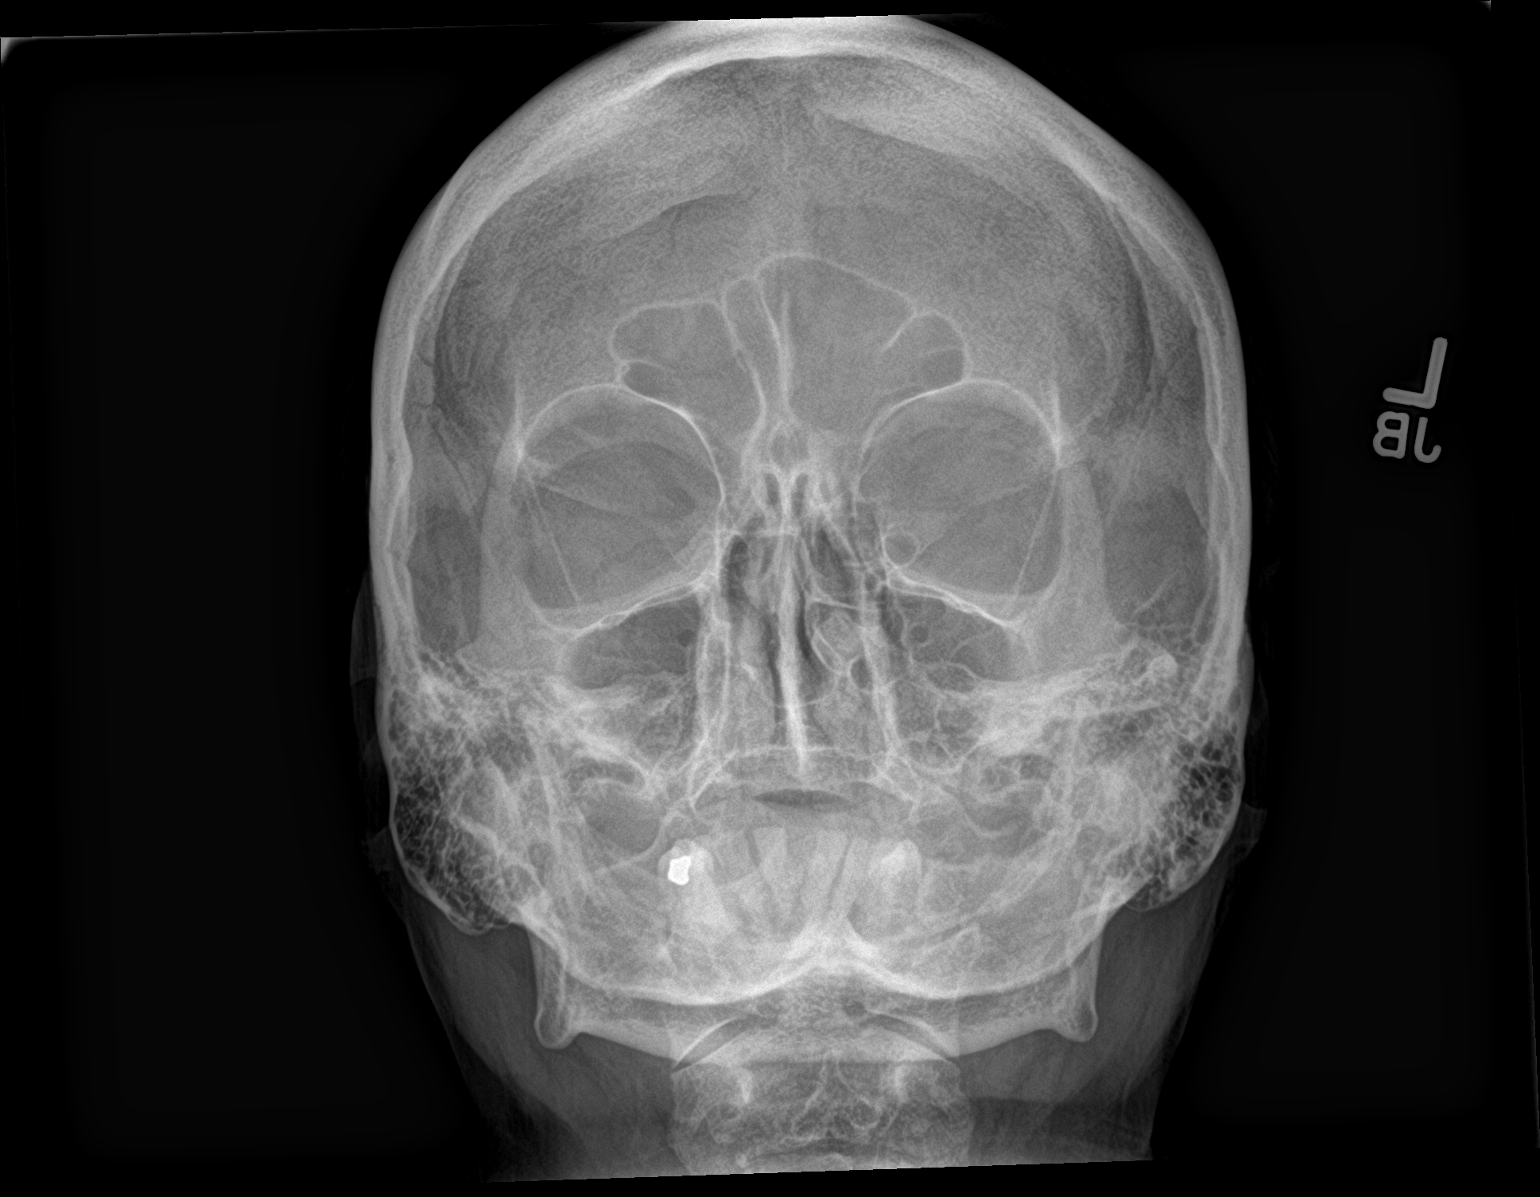

[2 of 2 positions shown; findings below may reference images not displayed]

FINDINGS: There is no evidence of metallic foreign body within the orbits. No
significant bone abnormality identified.
IMPRESSION: No evidence of metallic foreign body within the orbits.

## 2021-09-09 IMAGING — MR MR MRA HEAD W/O CM
1 series · 20 of 48 positions shown · non-contrast
Comparison: None.

CLINICAL DATA: TIA. Right eye peripheral vision loss on 06/21/2020
with confusion

EXAM:
MRA HEAD WITHOUT CONTRAST
TECHNIQUE: Angiographic images of the Circle of Willis were obtained using MRA
technique without intravenous contrast.

[Series 2: TOF · axial · non-contrast · 0.5mm · 0.35mm/px · z∈[+19,+85]mm · 20 of 164 slices shown]
[im 1/164]
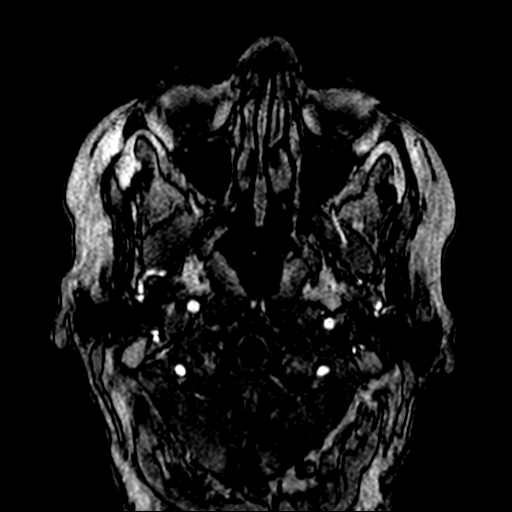
[im 4/164]
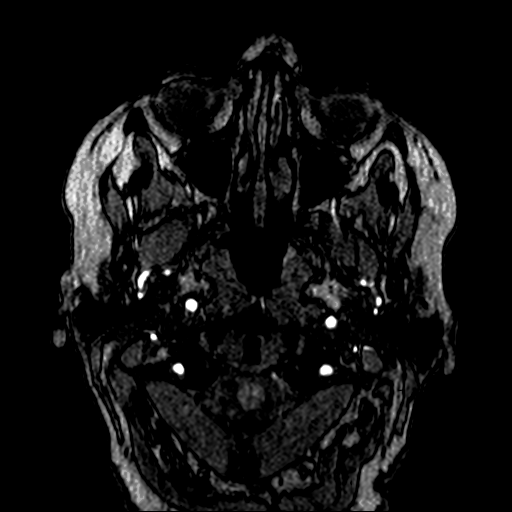
[im 7/164]
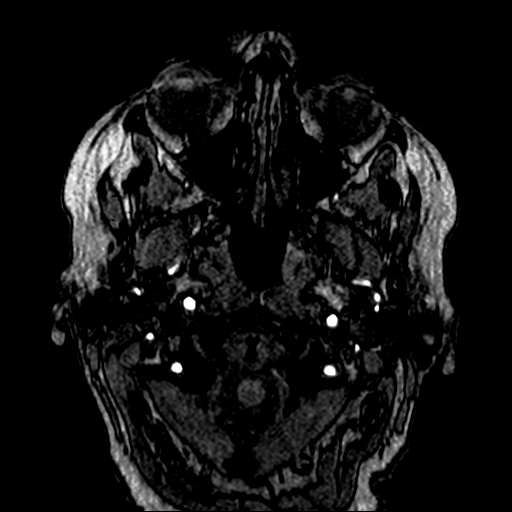
[im 11/164]
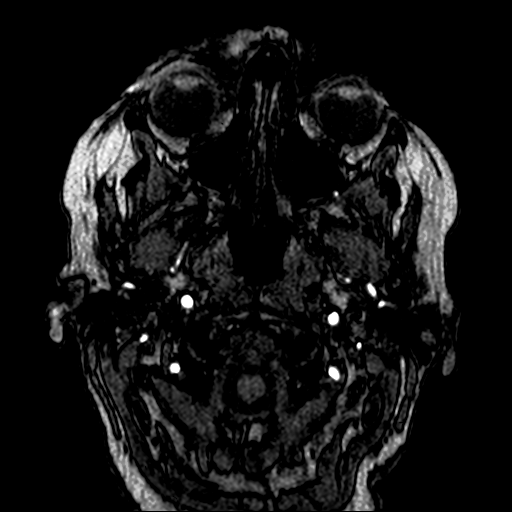
[im 14/164]
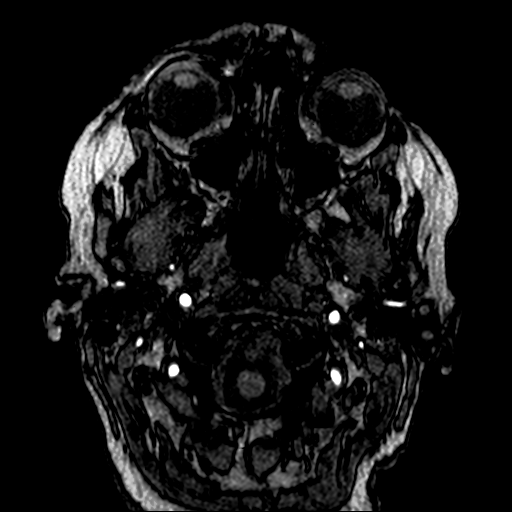
[im 18/164]
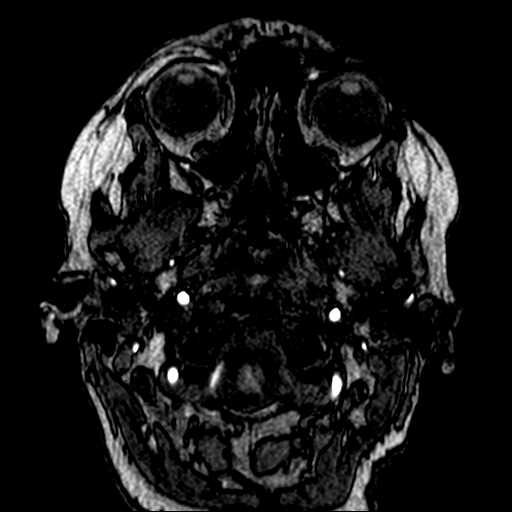
[im 21/164]
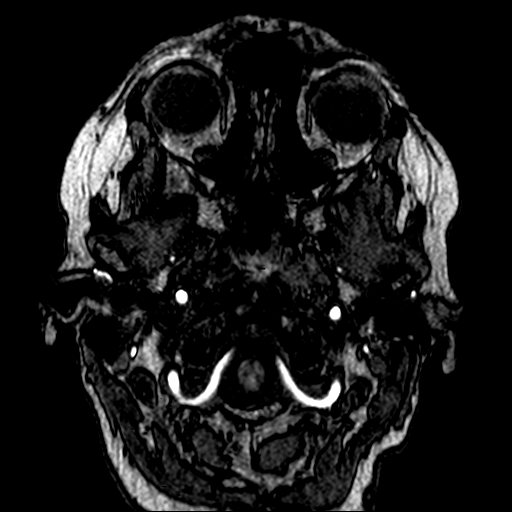
[im 25/164]
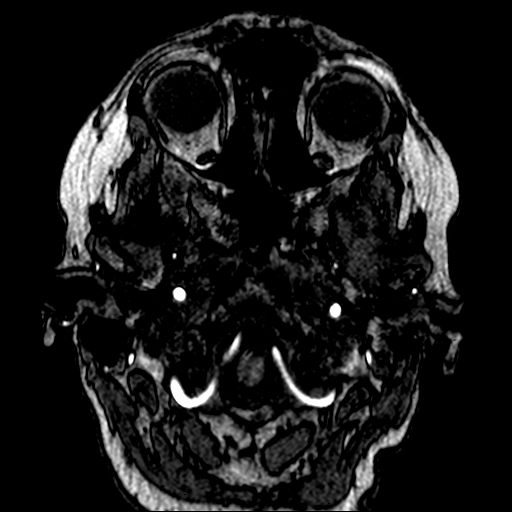
[im 28/164]
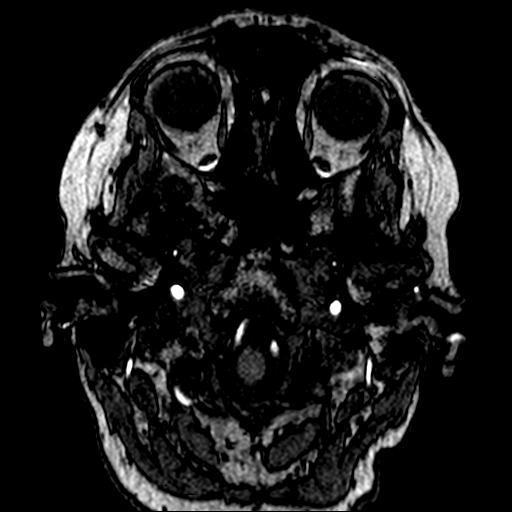
[im 32/164]
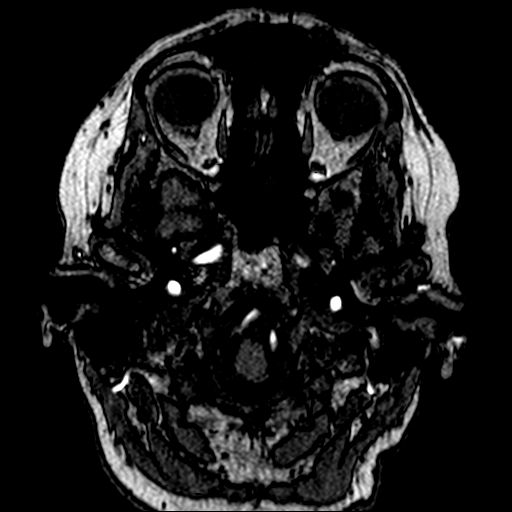
[im 35/164]
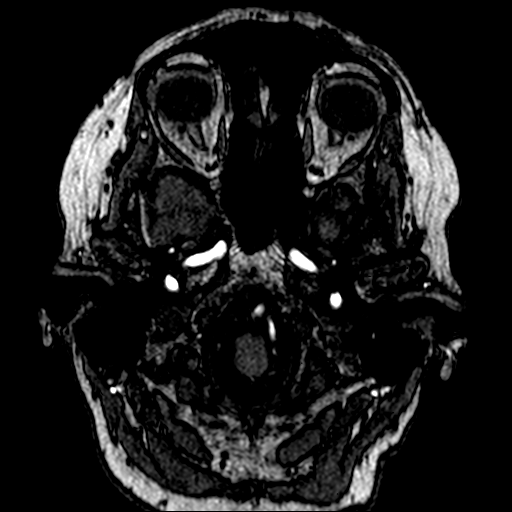
[im 39/164]
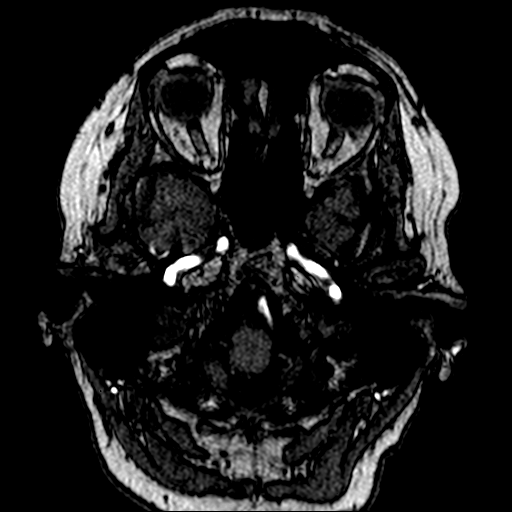
[im 53/164]
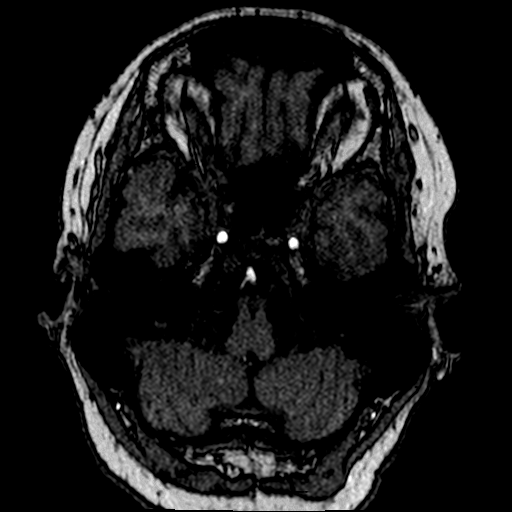
[im 73/164]
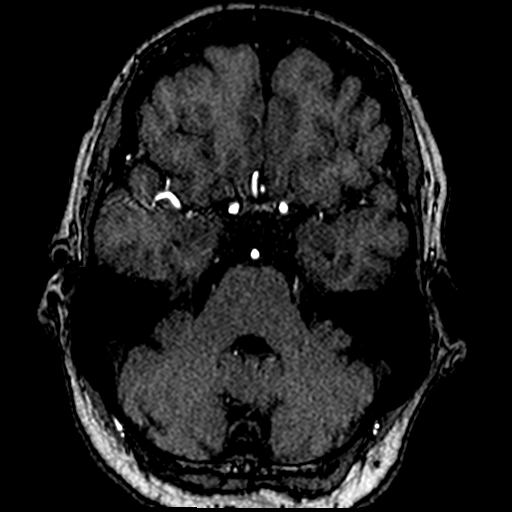
[im 84/164]
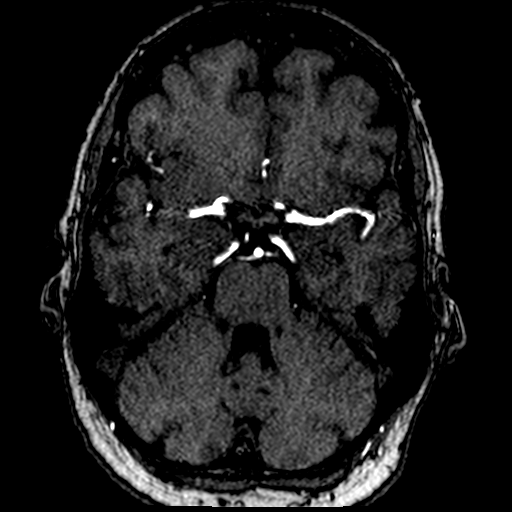
[im 94/164]
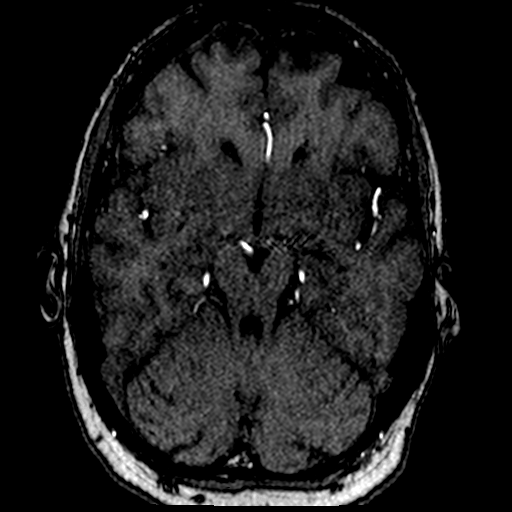
[im 115/164]
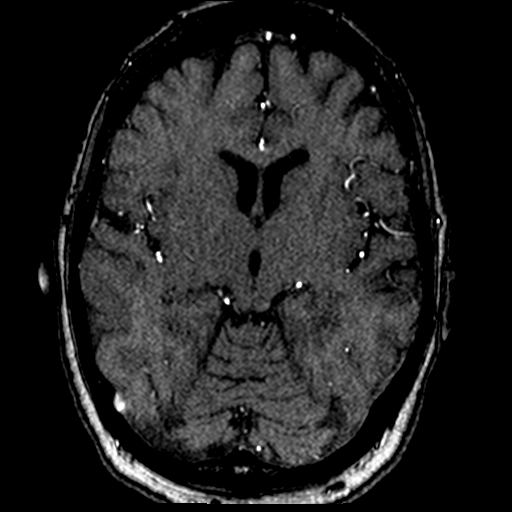
[im 136/164]
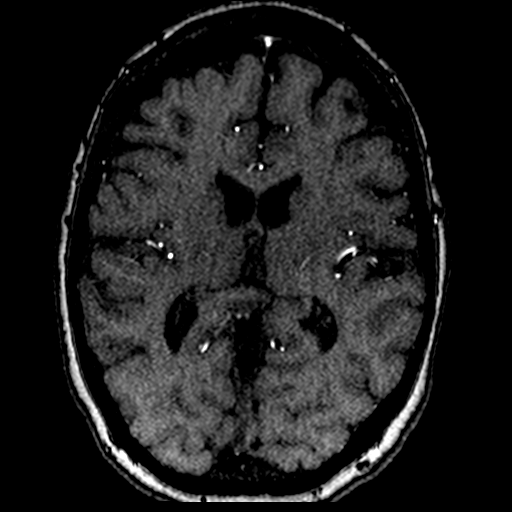
[im 139/164]
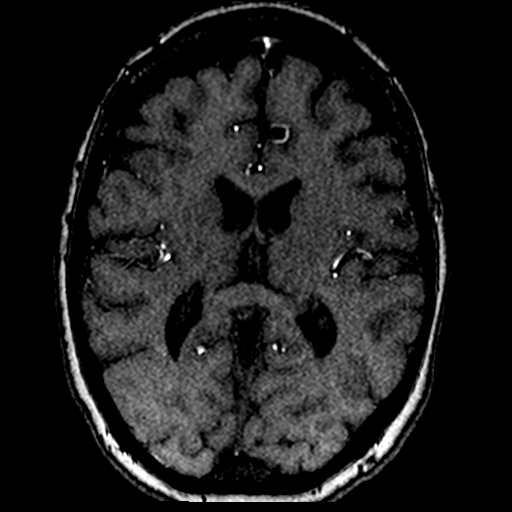
[im 157/164]
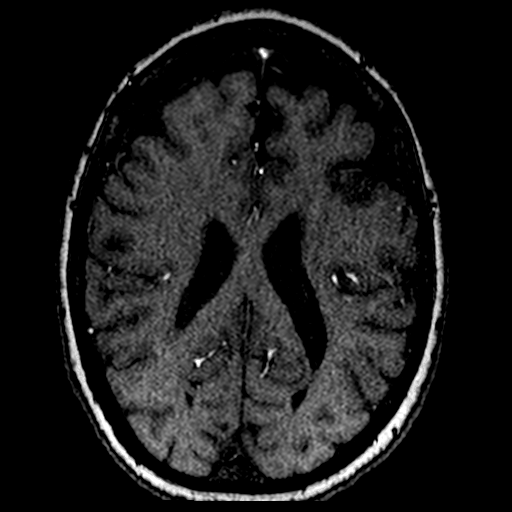

[20 of 48 positions shown; findings below may reference images not displayed]

FINDINGS: Both vertebral arteries patent to the basilar. PICA, AICA widely
patent. Basilar widely patent. Posterior cerebral arteries patent
bilaterally. Posterior communicating artery patent bilaterally.

Internal carotid artery patent bilaterally without stenosis.
Anterior and middle cerebral arteries patent bilaterally without
stenosis. Negative for aneurysm.
IMPRESSION: Negative MRA head

## 2021-09-16 ENCOUNTER — Encounter: Payer: Self-pay | Admitting: Medical-Surgical

## 2021-09-22 ENCOUNTER — Other Ambulatory Visit: Payer: Self-pay | Admitting: Medical-Surgical

## 2021-09-26 ENCOUNTER — Other Ambulatory Visit: Payer: Self-pay | Admitting: Medical-Surgical

## 2021-09-28 MED ORDER — HYDROCHLOROTHIAZIDE 25 MG PO TABS: 25.0000 mg | ORAL_TABLET | Freq: Every day | ORAL | 0 refills | Status: DC

## 2021-10-20 ENCOUNTER — Telehealth: Payer: Self-pay

## 2021-10-20 NOTE — Telephone Encounter (Signed)
[place] form Dr. Latanya Maudlin office called wanted to know if the patient is on a Pre-Med and what is she taking it for. ? ?The patient stated that she has a hole in her heart. ? ? ?

## 2021-10-22 ENCOUNTER — Other Ambulatory Visit: Payer: Self-pay | Admitting: Medical-Surgical

## 2021-10-29 ENCOUNTER — Other Ambulatory Visit: Payer: Self-pay | Admitting: Medical-Surgical

## 2021-11-02 DIAGNOSIS — H18513 Endothelial corneal dystrophy, bilateral: Secondary | ICD-10-CM | POA: Diagnosis not present

## 2021-11-02 DIAGNOSIS — H2513 Age-related nuclear cataract, bilateral: Secondary | ICD-10-CM | POA: Diagnosis not present

## 2021-11-02 DIAGNOSIS — H43313 Vitreous membranes and strands, bilateral: Secondary | ICD-10-CM | POA: Diagnosis not present

## 2021-11-06 ENCOUNTER — Encounter: Payer: Self-pay | Admitting: Medical-Surgical

## 2021-11-07 ENCOUNTER — Other Ambulatory Visit: Payer: Self-pay | Admitting: Medical-Surgical

## 2021-11-07 DIAGNOSIS — Z0000 Encounter for general adult medical examination without abnormal findings: Secondary | ICD-10-CM

## 2021-11-21 DIAGNOSIS — Z0000 Encounter for general adult medical examination without abnormal findings: Secondary | ICD-10-CM | POA: Diagnosis not present

## 2021-11-21 DIAGNOSIS — E7800 Pure hypercholesterolemia, unspecified: Secondary | ICD-10-CM | POA: Diagnosis not present

## 2021-11-22 LAB — COMPLETE METABOLIC PANEL WITH GFR
AG Ratio: 1.6 (calc) (ref 1.0–2.5)
ALT: 17 U/L (ref 6–29)
AST: 22 U/L (ref 10–35)
Albumin: 4.2 g/dL (ref 3.6–5.1)
Alkaline phosphatase (APISO): 85 U/L (ref 37–153)
BUN: 14 mg/dL (ref 7–25)
CO2: 29 mmol/L (ref 20–32)
Calcium: 9.5 mg/dL (ref 8.6–10.4)
Chloride: 100 mmol/L (ref 98–110)
Creat: 0.99 mg/dL (ref 0.50–1.05)
Globulin: 2.7 g/dL (calc) (ref 1.9–3.7)
Glucose, Bld: 95 mg/dL (ref 65–99)
Potassium: 3.6 mmol/L (ref 3.5–5.3)
Sodium: 139 mmol/L (ref 135–146)
Total Bilirubin: 0.7 mg/dL (ref 0.2–1.2)
Total Protein: 6.9 g/dL (ref 6.1–8.1)
eGFR: 62 mL/min/{1.73_m2} (ref >=60)

## 2021-11-22 LAB — TSH: TSH: 1.05 mIU/L (ref 0.40–4.50)

## 2021-11-22 LAB — CBC WITH DIFFERENTIAL/PLATELET
Absolute Monocytes: 548 cells/uL (ref 200–950)
Basophils Absolute: 68 cells/uL (ref 0–200)
Basophils Relative: 0.9 %
Eosinophils Absolute: 360 cells/uL (ref 15–500)
Eosinophils Relative: 4.8 %
HCT: 45.2 % — ABNORMAL HIGH (ref 35.0–45.0)
Hemoglobin: 14.7 g/dL (ref 11.7–15.5)
Lymphs Abs: 1958 cells/uL (ref 850–3900)
MCH: 29.6 pg (ref 27.0–33.0)
MCHC: 32.5 g/dL (ref 32.0–36.0)
MCV: 90.9 fL (ref 80.0–100.0)
MPV: 10.7 fL (ref 7.5–12.5)
Monocytes Relative: 7.3 %
Neutro Abs: 4568 cells/uL (ref 1500–7800)
Neutrophils Relative %: 60.9 %
Platelets: 277 10*3/uL (ref 140–400)
RBC: 4.97 10*6/uL (ref 3.80–5.10)
RDW: 12 % (ref 11.0–15.0)
Total Lymphocyte: 26.1 %
WBC: 7.5 10*3/uL (ref 3.8–10.8)

## 2021-11-22 LAB — LIPID PANEL
Cholesterol: 179 mg/dL (ref <200)
HDL: 73 mg/dL (ref >=50)
LDL Cholesterol (Calc): 85 mg/dL (calc)
Non-HDL Cholesterol (Calc): 106 mg/dL (calc) (ref <130)
Total CHOL/HDL Ratio: 2.5 (calc) (ref <5.0)
Triglycerides: 116 mg/dL (ref <150)

## 2021-11-24 NOTE — Progress Notes (Signed)
Complete physical exam  Patient: Lindsay Moore   DOB: October 05, 1955   66 y.o. Female  MRN: [id number]  Subjective:    Chief Complaint  Patient presents with   Annual Exam    Lindsay Moore is a 66 y.o. female who presents today for a complete physical exam. She reports consuming a general diet. The patient has a physically strenuous job, but has no regular exercise apart from work.  She generally feels well. She reports sleeping fairly well. She does not have additional problems to discuss today.    Most recent fall risk assessment:    05/23/2021    8:21 AM  Fall Risk   Falls in the past year? 1  Number falls in past yr: 1  Injury with Fall? 0  Follow up Falls evaluation completed     Most recent depression screenings:    05/23/2021    8:25 AM 05/23/2021    8:22 AM  PHQ 2/9 Scores  PHQ - 2 Score 1 1  PHQ- 9 Score 1     Vision:Within last year and Dental: No current dental problems and Receives regular dental care    Patient Care Team: Christen ButterJessup, Jeremias Broyhill, NP as PCP - General (Nurse Practitioner) Laren BoomHommel, Sean, DO (Family Medicine) Gabriel CarinaKline, Keesha J, [facility] as Pharmacist (Pharmacist)   Outpatient Medications Prior to Visit  Medication Sig   acetaminophen (TYLENOL) 650 MG CR tablet Take 1 tablet (650 mg total) by mouth every 8 (eight) hours as needed for pain.   albuterol (VENTOLIN HFA) 108 (90 Base) MCG/ACT inhaler Inhale 1-2 puffs into the lungs every 6 (six) hours as needed for wheezing or shortness of breath. (Patient not taking: Reported on 07/08/2021)   aspirin EC 81 MG tablet Take 1 tablet (81 mg total) by mouth daily.   atorvastatin (LIPITOR) 10 MG tablet Take 1 tablet (10 mg total) by mouth daily.   Biotin w/ Vitamins C & E (HAIR/SKIN/NAILS PO) Take by mouth daily.   Calcium Carb-Cholecalciferol (CALCIUM + D3 PO) Take by mouth.   carvedilol (COREG) 12.5 MG tablet TAKE 1 TABLET BY MOUTH TWICE DAILY WITH A MEAL   conjugated estrogens (PREMARIN) vaginal cream  Use daily for 14 days then reduce to twice weekly.   hydrochlorothiazide (HYDRODIURIL) 25 MG tablet Take 1 tablet (25 mg total) by mouth daily. TAKE 1 TABLET BY MOUTH ONCE DAILY.   hydrOXYzine (ATARAX/VISTARIL) 10 MG tablet Take 1 tablet (10 mg total) by mouth 3 (three) times daily as needed.   MEGARED OMEGA-3 KRILL OIL PO Take by mouth.   Multiple Vitamin (MULTIVITAMIN) tablet Take 1 tablet by mouth daily.   omeprazole (PRILOSEC) 20 MG capsule Take 20 mg by mouth daily.   potassium chloride (KLOR-CON M) 10 MEQ tablet Take 1 tablet (10 mEq total) by mouth daily. NEEDS ANNUAL APPOINTMENT IN MAY 2023   RESTASIS 0.05 % ophthalmic emulsion 1 drop 2 (two) times daily.   triamcinolone ointment (KENALOG) 0.1 % Apply 1 application topically 2 (two) times daily. To affected areas   [DISCONTINUED] potassium chloride (KLOR-CON) 10 MEQ tablet Take 1 tablet by mouth once daily   No facility-administered medications prior to visit.    Review of Systems  Constitutional:  Negative for chills, fever, malaise/fatigue and weight loss.  HENT:  Negative for congestion, ear pain, hearing loss, sinus pain and sore throat.   Eyes:  Negative for blurred vision, photophobia and pain.  Respiratory:  Negative for cough, shortness of breath and wheezing.   Cardiovascular:  Negative for chest pain, palpitations and leg swelling.  Gastrointestinal:  Negative for abdominal pain, constipation, diarrhea, heartburn, nausea and vomiting.  Genitourinary:  Negative for dysuria, frequency and urgency.  Musculoskeletal:  Negative for falls and neck pain.  Skin:  Negative for itching and rash.  Neurological:  Negative for dizziness, weakness and headaches.  Endo/Heme/Allergies:  Negative for polydipsia. Does not bruise/bleed easily.  Psychiatric/Behavioral:  Negative for depression, substance abuse and suicidal ideas. The patient is not nervous/anxious.      Objective:    BP (!) 160/86 (BP Location: Right Arm, Cuff Size:  Normal)   Pulse 84   Resp 20   Ht 5' 4.5" (1.638 m)   Wt 158 lb 3.2 oz (71.8 kg)   SpO2 97%   BMI 26.74 kg/m    Physical Exam Vitals reviewed.  Constitutional:      General: She is not in acute distress.    Appearance: Normal appearance. She is not ill-appearing.  HENT:     Head: Normocephalic and atraumatic.     Right Ear: Tympanic membrane, ear canal and external ear normal. There is no impacted cerumen.     Left Ear: Tympanic membrane, ear canal and external ear normal. There is no impacted cerumen.     Nose: Nose normal. No congestion or rhinorrhea.     Mouth/Throat:     Mouth: Mucous membranes are moist.     Pharynx: No oropharyngeal exudate or posterior oropharyngeal erythema.  Eyes:     General: No scleral icterus.       Right eye: No discharge.        Left eye: No discharge.     Extraocular Movements: Extraocular movements intact.     Conjunctiva/sclera: Conjunctivae normal.     Pupils: Pupils are equal, round, and reactive to light.  Neck:     Thyroid: No thyromegaly.     Vascular: No carotid bruit or JVD.     Trachea: Trachea normal.  Cardiovascular:     Rate and Rhythm: Normal rate and regular rhythm.     Pulses: Normal pulses.     Heart sounds: Normal heart sounds. No murmur heard.   No friction rub. No gallop.  Pulmonary:     Effort: Pulmonary effort is normal. No respiratory distress.     Breath sounds: Normal breath sounds. No wheezing.  Abdominal:     General: Bowel sounds are normal. There is no distension.     Palpations: Abdomen is soft.     Tenderness: There is no abdominal tenderness. There is no guarding.  Musculoskeletal:        General: Normal range of motion.     Cervical back: Normal range of motion and neck supple.  Skin:    General: Skin is warm and dry.  Neurological:     Mental Status: She is alert and oriented to person, place, and time.     Cranial Nerves: No cranial nerve deficit.  Psychiatric:        Mood and Affect: Mood  normal.        Behavior: Behavior normal.        Thought Content: Thought content normal.        Judgment: Judgment normal.    No results found for any visits on 11/25/21.     Assessment & Plan:    Routine Health Maintenance and Physical Exam  Immunization History  Administered Date(s) Administered   DTaP 01/31/2009   Influenza Split 05/17/2011, 05/29/2012   Influenza Whole  04/02/2013   Influenza,inj,Quad PF,6+ Mos 03/18/2014, 05/13/2015, 05/10/2016, 04/12/2017, 04/12/2018, 05/26/2019, 05/26/2020, 06/06/2021   Tdap 10/10/2016   Zoster Recombinat (Shingrix) 10/17/2016, 02/12/2017    Health Maintenance  Topic Date Due   URINE MICROALBUMIN  Never done   Pneumonia Vaccine 79+ Years old (1 - PCV) 05/23/2022 (Originally 09/30/2019)   INFLUENZA VACCINE  01/31/2022   MAMMOGRAM  08/02/2023   TETANUS/TDAP  10/11/2026   COLONOSCOPY (Pts 45-33yrs Insurance coverage will need to be confirmed)  05/08/2027   DEXA SCAN  Completed   Hepatitis C Screening  Completed   Zoster Vaccines- Shingrix  Completed   HPV VACCINES  Aged Out   COVID-19 Vaccine  Discontinued    Discussed health benefits of physical activity, and encouraged her to engage in regular exercise appropriate for her age and condition.  Problem List Items Addressed This Visit       Cardiovascular and Mediastinum   Essential hypertension, benign (Chronic)    Blood pressure elevated on arrival today however she was chasing around her 55-year-old grandson and has a bit of whitecoat hypertension.  Has been taking hydrochlorothiazide 25 mg daily and Coreg 12.5 mg twice daily         Respiratory   Chronic obstructive pulmonary disease with acute exacerbation (HCC)    Doing well overall and stable.  Continue as needed albuterol inhaler.         Other   Situational anxiety (Chronic)    Continue lifestyle modifications and behavioral management techniques.  Okay to use hydroxyzine as needed if desired.        Hyperlipidemia (Chronic)    Continue Lipitor 10 mg daily.       Annual physical exam - Primary    Reviewed lab results.  Up-to-date on preventative care.  Wellness information provided with AVS.       Return in about 1 year (around 11/26/2022) for annual physical exam.   ___________________________________________ Thayer Ohm, DNP, APRN, FNP-BC Primary Care and Sports Medicine [facility]

## 2021-11-25 ENCOUNTER — Other Ambulatory Visit: Payer: Self-pay | Admitting: Medical-Surgical

## 2021-11-25 ENCOUNTER — Encounter: Payer: Self-pay | Admitting: Medical-Surgical

## 2021-11-25 ENCOUNTER — Ambulatory Visit (INDEPENDENT_AMBULATORY_CARE_PROVIDER_SITE_OTHER): Payer: Medicare PPO | Admitting: Medical-Surgical

## 2021-11-25 VITALS — BP 160/86 | HR 84 | Resp 20 | Ht 64.5 in | Wt 158.2 lb

## 2021-11-25 DIAGNOSIS — Z0000 Encounter for general adult medical examination without abnormal findings: Secondary | ICD-10-CM | POA: Diagnosis not present

## 2021-11-25 DIAGNOSIS — I10 Essential (primary) hypertension: Secondary | ICD-10-CM

## 2021-11-25 DIAGNOSIS — J441 Chronic obstructive pulmonary disease with (acute) exacerbation: Secondary | ICD-10-CM | POA: Diagnosis not present

## 2021-11-25 DIAGNOSIS — F418 Other specified anxiety disorders: Secondary | ICD-10-CM | POA: Diagnosis not present

## 2021-11-25 DIAGNOSIS — E782 Mixed hyperlipidemia: Secondary | ICD-10-CM | POA: Diagnosis not present

## 2021-11-25 NOTE — Assessment & Plan Note (Signed)
Blood pressure elevated on arrival today however she was chasing around her 67-year-old grandson and has a bit of whitecoat hypertension.  Has been taking hydrochlorothiazide 25 mg daily and Coreg 12.5 mg twice daily

## 2021-11-25 NOTE — Assessment & Plan Note (Signed)
Doing well overall and stable.  Continue as needed albuterol inhaler.

## 2021-11-25 NOTE — Assessment & Plan Note (Signed)
Continue lifestyle modifications and behavioral management techniques.  Okay to use hydroxyzine as needed if desired.

## 2021-11-25 NOTE — Assessment & Plan Note (Signed)
Reviewed lab results.  Up-to-date on preventative care.  Wellness information provided with AVS.

## 2021-11-25 NOTE — Assessment & Plan Note (Signed)
-  Continue Lipitor 10mg daily

## 2021-12-03 ENCOUNTER — Other Ambulatory Visit: Payer: Self-pay | Admitting: Medical-Surgical

## 2021-12-05 ENCOUNTER — Encounter: Payer: Self-pay | Admitting: Medical-Surgical

## 2021-12-05 MED ORDER — POTASSIUM CHLORIDE CRYS ER 10 MEQ PO TBCR: 10.0000 meq | EXTENDED_RELEASE_TABLET | Freq: Every day | ORAL | 0 refills | Status: DC

## 2021-12-05 MED ORDER — HYDROCHLOROTHIAZIDE 25 MG PO TABS: 25.0000 mg | ORAL_TABLET | Freq: Every day | ORAL | 0 refills | Status: DC

## 2021-12-06 MED ORDER — PREMARIN 0.625 MG/GM VA CREA: TOPICAL_CREAM | VAGINAL | 12 refills | Status: DC

## 2021-12-06 MED ORDER — ATORVASTATIN CALCIUM 10 MG PO TABS: 10.0000 mg | ORAL_TABLET | Freq: Every day | ORAL | 3 refills | Status: DC

## 2021-12-13 ENCOUNTER — Other Ambulatory Visit: Payer: Self-pay | Admitting: Medical-Surgical

## 2021-12-13 MED ORDER — PREMARIN 0.625 MG/GM VA CREA: TOPICAL_CREAM | VAGINAL | 12 refills | Status: DC

## 2021-12-14 ENCOUNTER — Ambulatory Visit (INDEPENDENT_AMBULATORY_CARE_PROVIDER_SITE_OTHER): Payer: Medicare PPO | Admitting: Pharmacist

## 2021-12-14 DIAGNOSIS — I10 Essential (primary) hypertension: Secondary | ICD-10-CM

## 2021-12-14 DIAGNOSIS — E782 Mixed hyperlipidemia: Secondary | ICD-10-CM

## 2021-12-14 NOTE — Progress Notes (Signed)
Chronic Care Management Pharmacy Note  12/14/2021 Name:  Lindsay Moore MRN:  [id number] DOB:  05-23-1956  Summary: addressed HTN, HLD. Patient states BP readings from home have stayed mostly 140s/ 80-90s.  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   Requesting omeprazole RX to [facility] instead of OTC to see if this can lower her cost.  Recommendations/Changes made from today's visit:  PCP to  please send omeprazole 20mg  daily, otherwise no changes, pt is doing well.  Plan: f/u with pharmacist in 6 months  Subjective: Lindsay Moore is an 67 y.o. year old female who is a primary patient of 78, NP.  The CCM team was consulted for assistance with disease management and care coordination needs.    Engaged with patient by telephone for follow up visit in response to provider referral for pharmacy case management and/or care coordination services.   Consent to Services:  The patient was given information about Chronic Care Management services, agreed to services, and gave verbal consent prior to initiation of services.  Please see initial visit note for detailed documentation.   Patient Care Team: Christen Butter, NP as PCP - General (Nurse Practitioner) Christen Butter, DO (Family Medicine) Laren Boom, [facility] as Pharmacist (Pharmacist)  Recent office visits:  12/14/20 12/16/20 NP- pt was seen for a routine wellness examination. Pt advised to follow up as planned.   11/24/20 11/26/20 NP- pt was seen for a annual physical examination. Labs were ordered and an xray of right foot was completed. Pt started on conjugated estrogens (PREMARIN) vaginal cream and advised to follow up in 6 months.   Recent consult visits:  None Noted   Hospital visits:  None in previous 6 months  Objective:  Lab Results  Component Value Date   CREATININE 0.99 11/21/2021   CREATININE 1.09 (H)  05/17/2021   CREATININE 1.24 (H) 01/06/2021       Component Value Date/Time   CHOL 179 11/21/2021 0000   TRIG 116 11/21/2021 0000   HDL 73 11/21/2021 0000   CHOLHDL 2.5 11/21/2021 0000   VLDL 33 (H) 10/03/2016 0826   LDLCALC 85 11/21/2021 0000       Latest Ref Rng & Units 11/21/2021   12:00 AM 05/17/2021   12:00 AM 01/06/2021   12:00 AM  Hepatic Function  Total Protein 6.1 - 8.1 g/dL 6.9  6.8  6.8   AST 10 - 35 U/L 22  28  21    ALT 6 - 29 U/L 17  24  17    Total Bilirubin 0.2 - 1.2 mg/dL 0.7  0.4  0.4     Lab Results  Component Value Date/Time   TSH 1.05 11/21/2021 12:00 AM   TSH 1.49 11/24/2020 12:00 AM   FREET4 1.30 07/26/2015 03:53 PM       Latest Ref Rng & Units 11/21/2021   12:00 AM 05/17/2021   12:00 AM 11/24/2020   12:00 AM  CBC  WBC 3.8 - 10.8 Thousand/uL 7.5  7.5  8.0   Hemoglobin 11.7 - 15.5 g/dL 11/23/2021  05/19/2021  11/26/2020   Hematocrit 35.0 - 45.0 % 45.2  43.9  45.3   Platelets 140 - 400 Thousand/uL 277  307  263     Lab Results  Component Value Date/Time   VD25OH 34 10/21/2018 08:47 AM    Clinical ASCVD:  The 10-year ASCVD risk score (Arnett  DK, et al., 2019) is: 22.4%   Values used to calculate the score:     Age: [age] years     Sex: Female     Is Non-Hispanic African American: No     Diabetic: Yes     Tobacco smoker: No     Systolic Blood Pressure: 160 mmHg     Is BP treated: Yes     HDL Cholesterol: 73 mg/dL     Total Cholesterol: 179 mg/dL     Social History   Tobacco Use  Smoking Status Former   Packs/day: 1.00   Years: 25.00   Total pack years: 25.00   Types: Cigarettes   Quit date: 07/03/1997   Years since quitting: 24.4  Smokeless Tobacco Never   BP Readings from Last 3 Encounters:  11/25/21 (!) 160/86  06/06/21 (!) 156/87  05/23/21 (!) 177/95   Pulse Readings from Last 3 Encounters:  11/25/21 84  06/06/21 90  05/23/21 75   Wt Readings from Last 3 Encounters:  11/25/21 158 lb 3.2 oz (71.8 kg)  05/23/21 164 lb 9.6 oz (74.7 kg)   11/24/20 160 lb 12.8 oz (72.9 kg)    Assessment: Review of patient past medical history, allergies, medications, health status, including review of consultants reports, laboratory and other test data, was performed as part of comprehensive evaluation and provision of chronic care management services.   SDOH:  (Social Determinants of Health) assessments and interventions performed:    CCM Care Plan  Allergies  Allergen Reactions   Ibuprofen Shortness Of Breath   Amlodipine Swelling   Bee Venom Other (See Comments)   Lisinopril Swelling    Medications Reviewed Today     Reviewed by Latanya PresserScribner, Carol L, CMA (Certified Medical Assistant) on 11/25/21 at [phone]  Med List Status: <None>   Medication Order Taking? Sig Documenting Provider Last Dose Status Informant  acetaminophen (TYLENOL) 650 MG CR tablet [id number] No Take 1 tablet (650 mg total) by mouth every 8 (eight) hours as needed for pain. Monica Bectonhekkekandam, Thomas J, MD Taking Active            Med Note (GARNER, TIFFANY L   Wed Mar 17, 2020  1:32 PM)    albuterol (VENTOLIN HFA) 108 (90 Base) MCG/ACT inhaler [id number] No Inhale 1-2 puffs into the lungs every 6 (six) hours as needed for wheezing or shortness of breath.  Patient not taking: Reported on 07/08/2021   Rolla PlatePhelps, Erin O, PA-C Not Taking Active            Med Note Amil Amen(OSIPENKO, CIERA J   Tue Jun 22, 2020  9:36 AM) Never used per pt  aspirin EC 81 MG tablet [id number] No Take 1 tablet (81 mg total) by mouth daily. Laren BoomHommel, Sean, DO Taking Active   atorvastatin (LIPITOR) 10 MG tablet [id number] No Take 1 tablet (10 mg total) by mouth daily. Christen ButterJessup, Joy, NP Taking Active   Biotin w/ Vitamins C & E (HAIR/SKIN/NAILS PO) [id number] No Take by mouth daily. [provider] Taking Active   Calcium Carb-Cholecalciferol (CALCIUM + D3 PO) [id number] No Take by mouth. [provider] Taking Active   carvedilol (COREG) 12.5 MG tablet [id number]  TAKE 1 TABLET BY MOUTH TWICE DAILY WITH A  MEAL Jessup, Joy, NP  Active   conjugated estrogens (PREMARIN) vaginal cream [id number] No Use daily for 14 days then reduce to twice weekly. Christen ButterJessup, Joy, NP Taking Active   hydrochlorothiazide (HYDRODIURIL) 25 MG tablet [id number]  Take 1 tablet (25 mg  total) by mouth daily. TAKE 1 TABLET BY MOUTH ONCE DAILY. Christen Butter, NP  Active   hydrOXYzine (ATARAX/VISTARIL) 10 MG tablet [id number] No Take 1 tablet (10 mg total) by mouth 3 (three) times daily as needed. Christen Butter, NP Taking Active   MEGARED OMEGA-3 KRILL OIL PO [id number] No Take by mouth. [provider] Taking Active   Multiple Vitamin (MULTIVITAMIN) tablet [id number] No Take 1 tablet by mouth daily. [provider] Taking Active   omeprazole (PRILOSEC) 20 MG capsule [id number] No Take 20 mg by mouth daily. [provider] Taking Active   potassium chloride (KLOR-CON M) 10 MEQ tablet [id number]  Take 1 tablet (10 mEq total) by mouth daily. NEEDS ANNUAL APPOINTMENT IN MAY 2023 Christen Butter, NP  Active   RESTASIS 0.05 % ophthalmic emulsion [id number] No 1 drop 2 (two) times daily. [provider] Taking Active   triamcinolone ointment (KENALOG) 0.1 % [id number] No Apply 1 application topically 2 (two) times daily. To affected areas Sunnie Nielsen, DO Taking Active             Patient Active Problem List   Diagnosis Date Noted   Annual physical exam 11/25/2021   Heart defect    AC (acromioclavicular) joint bone spurs 05/07/2020   Mild mitral valve regurgitation 05/07/2020   Palpitations 03/17/2020   Chronic obstructive pulmonary disease with acute exacerbation (HCC) 03/04/2020   Closed displaced fracture of proximal phalanx of third toe of right foot 12/30/2019   Flexural eczema 11/23/2016   Primary osteoarthritis of both knees 08/31/2016   Biceps tendinitis on right 06/22/2015   Serous otitis media 01/06/2015   Postmenopausal bleeding 08/17/2014   History of abnormal cervical Pap smear 06/19/2014    Lumbar strain 01/06/2014   Breast mass 07/11/2013   Encounter for colonoscopy due to history of colonic polyp 06/19/2013   TIA (transient ischemic attack) 06/10/2013   Essential hypertension, benign 06/10/2013   Septal defect 06/10/2013   Ocular migraine 06/10/2013   Situational anxiety 06/10/2013   Hyperlipidemia 06/10/2013   Right knee pain 06/10/2013    Immunization History  Administered Date(s) Administered   DTaP 01/31/2009   Influenza Split 05/17/2011, 05/29/2012   Influenza Whole 04/02/2013   Influenza,inj,Quad PF,6+ Mos 03/18/2014, 05/13/2015, 05/10/2016, 04/12/2017, 04/12/2018, 05/26/2019, 05/26/2020, 06/06/2021   Tdap 10/10/2016   Zoster Recombinat (Shingrix) 10/17/2016, 02/12/2017    Conditions to be addressed/monitored: HTN and HLD  There are no care plans that you recently modified to display for this patient.       Medication Assistance: None required.  Patient affirms current coverage meets needs.  Patient's preferred pharmacy is:  [facility] [address] [place] - [address] [place] [address] Phone: [phone] Fax: [phone]   Uses pill box? Yes Pt endorses 100% compliance  Follow Up:  Patient agrees to Care Plan and Follow-up.  Plan: Telephone follow up appointment with care management team member scheduled for:  6 months  Lynnda Shields, PharmD Clinical Pharmacist [facility] [phone]

## 2021-12-15 MED ORDER — OMEPRAZOLE 20 MG PO CPDR
20.0000 mg | DELAYED_RELEASE_CAPSULE | Freq: Every day | ORAL | 3 refills | Status: DC
Start: 2021-12-15 — End: 2022-12-05

## 2021-12-15 NOTE — Addendum Note (Signed)
Addended byChristen Butter on: 12/15/2021 07:25 PM   Modules accepted: Orders

## 2021-12-15 NOTE — Patient Instructions (Signed)
Visit Information  Thank you for taking time to visit with me today. Please don't hesitate to contact me if I can be of assistance to you before our next scheduled telephone appointment.  Following are the goals we discussed today:  Patient Goals/Self-Care Activities Over the next 180 days, patient will:  take medications as prescribed  Follow Up Plan: Telephone follow up appointment with care management team member scheduled for: 6 months  Please call the care guide team at [phone] if you need to cancel or reschedule your appointment.   Patient verbalizes understanding of instructions and care plan provided today and agrees to view in MyChart. Active MyChart status and patient understanding of how to access instructions and care plan via MyChart confirmed with patient.     Lindsay Moore J Salar Molden  

## 2021-12-30 DIAGNOSIS — E782 Mixed hyperlipidemia: Secondary | ICD-10-CM

## 2021-12-30 DIAGNOSIS — I10 Essential (primary) hypertension: Secondary | ICD-10-CM

## 2022-01-16 ENCOUNTER — Encounter: Payer: Self-pay | Admitting: Medical-Surgical

## 2022-01-17 ENCOUNTER — Other Ambulatory Visit: Payer: Self-pay | Admitting: Medical-Surgical

## 2022-01-17 MED ORDER — AMOXICILLIN 500 MG PO CAPS: 2000.0000 mg | ORAL_CAPSULE | Freq: Once | ORAL | 0 refills | Status: DC

## 2022-02-22 ENCOUNTER — Encounter: Payer: Self-pay | Admitting: General Practice

## 2022-02-25 ENCOUNTER — Other Ambulatory Visit: Payer: Self-pay | Admitting: Medical-Surgical

## 2022-02-27 ENCOUNTER — Other Ambulatory Visit: Payer: Self-pay | Admitting: Medical-Surgical

## 2022-02-27 MED ORDER — HYDROCHLOROTHIAZIDE 25 MG PO TABS: 25.0000 mg | ORAL_TABLET | Freq: Every day | ORAL | 0 refills | Status: DC

## 2022-02-27 MED ORDER — POTASSIUM CHLORIDE CRYS ER 10 MEQ PO TBCR: 10.0000 meq | EXTENDED_RELEASE_TABLET | Freq: Every day | ORAL | 0 refills | Status: DC

## 2022-03-03 ENCOUNTER — Ambulatory Visit (INDEPENDENT_AMBULATORY_CARE_PROVIDER_SITE_OTHER): Payer: Medicare PPO | Admitting: Sports Medicine

## 2022-03-03 DIAGNOSIS — Z0000 Encounter for general adult medical examination without abnormal findings: Secondary | ICD-10-CM

## 2022-03-03 NOTE — Patient Instructions (Addendum)
MEDICARE ANNUAL WELLNESS VISIT Health Maintenance Summary and Written Plan of Care  Ms. Lindsay Moore ,  Thank you for allowing me to perform your Medicare Annual Wellness Visit and for your ongoing commitment to your health.   Health Maintenance & Immunization History Health Maintenance  Topic Date Due   Diabetic kidney evaluation - Urine ACR  03/04/2022 (Originally 09/29/1973)   Pneumonia Vaccine 67+ Years old (1 - PCV) 05/23/2022 (Originally 09/30/2019)   INFLUENZA VACCINE  10/01/2022 (Originally 01/31/2022)   Diabetic kidney evaluation - GFR measurement  11/22/2022   DEXA SCAN  12/04/2022   MAMMOGRAM  08/02/2023   TETANUS/TDAP  10/11/2026   COLONOSCOPY (Pts 45-38yrs Insurance coverage will need to be confirmed)  05/08/2027   Hepatitis C Screening  Completed   Zoster Vaccines- Shingrix  Completed   HPV VACCINES  Aged Out   COVID-19 Vaccine  Discontinued   Immunization History  Administered Date(s) Administered   DTaP 01/31/2009   Influenza Split 05/17/2011, 05/29/2012   Influenza Whole 04/02/2013   Influenza,inj,Quad PF,6+ Mos 03/18/2014, 05/13/2015, 05/10/2016, 04/12/2017, 04/12/2018, 05/26/2019, 05/26/2020, 06/06/2021   Tdap 10/10/2016   Zoster Recombinat (Shingrix) 10/17/2016, 02/12/2017    These are the patient goals that we discussed:  Goals Addressed               This Visit's Progress     Patient Stated (pt-stated)        03/03/2022 AWV Goal: Exercise for General Health  Patient will verbalize understanding of the benefits of increased physical activity: Exercising regularly is important. It will improve your overall fitness, flexibility, and endurance. Regular exercise also will improve your overall health. It can help you control your weight, reduce stress, and improve your bone density. Over the next year, patient will increase physical activity as tolerated with a goal of at least 150 minutes of moderate physical activity per week.  You can tell that you are  exercising at a moderate intensity if your heart starts beating faster and you start breathing faster but can still hold a conversation. Moderate-intensity exercise ideas include: Walking 1 mile (1.6 km) in about 15 minutes Biking Hiking Golfing Dancing Water aerobics Patient will verbalize understanding of everyday activities that increase physical activity by providing examples like the following: Yard work, such as: Insurance underwriter Gardening Washing windows or floors Patient will be able to explain general safety guidelines for exercising:  Before you start a new exercise program, talk with your health care provider. Do not exercise so much that you hurt yourself, feel dizzy, or get very short of breath. Wear comfortable clothes and wear shoes with good support. Drink plenty of water while you exercise to prevent dehydration or heat stroke. Work out until your breathing and your heartbeat get faster.          This is a list of Health Maintenance Items that are overdue or due now: Pneumococcal vaccine  Influenza vaccine  Orders/Referrals Placed Today: No orders of the defined types were placed in this encounter.  (Contact our referral department at [phone] if you have not spoken with someone about your referral appointment within the next 5 days)    Follow-up Plan Follow-up with Christen Butter, NP as planned Schedule your pneumonia and influenza vaccine. Medicare wellness visit in one year. Patient will access AVS on my chart.      Health Maintenance, Female Adopting a healthy lifestyle and getting preventive care are  important in promoting health and wellness. Ask your health care provider about: The right schedule for you to have regular tests and exams. Things you can do on your own to prevent diseases and keep yourself healthy. What should I know about diet, weight, and  exercise? Eat a healthy diet  Eat a diet that includes plenty of vegetables, fruits, low-fat dairy products, and lean protein. Do not eat a lot of foods that are high in solid fats, added sugars, or sodium. Maintain a healthy weight Body mass index (BMI) is used to identify weight problems. It estimates body fat based on height and weight. Your health care provider can help determine your BMI and help you achieve or maintain a healthy weight. Get regular exercise Get regular exercise. This is one of the most important things you can do for your health. Most adults should: Exercise for at least 150 minutes each week. The exercise should increase your heart rate and make you sweat (moderate-intensity exercise). Do strengthening exercises at least twice a week. This is in addition to the moderate-intensity exercise. Spend less time sitting. Even light physical activity can be beneficial. Watch cholesterol and blood lipids Have your blood tested for lipids and cholesterol at 67 years of age, then have this test every 5 years. Have your cholesterol levels checked more often if: Your lipid or cholesterol levels are high. You are older than 67 years of age. You are at high risk for heart disease. What should I know about cancer screening? Depending on your health history and family history, you may need to have cancer screening at various ages. This may include screening for: Breast cancer. Cervical cancer. Colorectal cancer. Skin cancer. Lung cancer. What should I know about heart disease, diabetes, and high blood pressure? Blood pressure and heart disease High blood pressure causes heart disease and increases the risk of stroke. This is more likely to develop in people who have high blood pressure readings or are overweight. Have your blood pressure checked: Every 3-5 years if you are 51-75 years of age. Every year if you are 100 years old or older. Diabetes Have regular diabetes  screenings. This checks your fasting blood sugar level. Have the screening done: Once every three years after age 72 if you are at a normal weight and have a low risk for diabetes. More often and at a younger age if you are overweight or have a high risk for diabetes. What should I know about preventing infection? Hepatitis B If you have a higher risk for hepatitis B, you should be screened for this virus. Talk with your health care provider to find out if you are at risk for hepatitis B infection. Hepatitis C Testing is recommended for: Everyone born from [birth year] through 1966. Anyone with known risk factors for hepatitis C. Sexually transmitted infections (STIs) Get screened for STIs, including gonorrhea and chlamydia, if: You are sexually active and are younger than 67 years of age. You are older than 67 years of age and your health care provider tells you that you are at risk for this type of infection. Your sexual activity has changed since you were last screened, and you are at increased risk for chlamydia or gonorrhea. Ask your health care provider if you are at risk. Ask your health care provider about whether you are at high risk for HIV. Your health care provider may recommend a prescription medicine to help prevent HIV infection. If you choose to take medicine to prevent HIV, you  should first get tested for HIV. You should then be tested every 3 months for as long as you are taking the medicine. Pregnancy If you are about to stop having your period (premenopausal) and you may become pregnant, seek counseling before you get pregnant. Take 400 to 800 micrograms (mcg) of folic acid every day if you become pregnant. Ask for birth control (contraception) if you want to prevent pregnancy. Osteoporosis and menopause Osteoporosis is a disease in which the bones lose minerals and strength with aging. This can result in bone fractures. If you are 41 years old or older, or if you are at risk for  osteoporosis and fractures, ask your health care provider if you should: Be screened for bone loss. Take a calcium or vitamin D supplement to lower your risk of fractures. Be given hormone replacement therapy (HRT) to treat symptoms of menopause. Follow these instructions at home: Alcohol use Do not drink alcohol if: Your health care provider tells you not to drink. You are pregnant, may be pregnant, or are planning to become pregnant. If you drink alcohol: Limit how much you have to: 0-1 drink a day. Know how much alcohol is in your drink. In the [place] one drink equals one 12 oz bottle of beer (355 mL), one 5 oz glass of wine (148 mL), or one 1 oz glass of hard liquor (44 mL). Lifestyle Do not use any products that contain nicotine or tobacco. These products include cigarettes, chewing tobacco, and vaping devices, such as e-cigarettes. If you need help quitting, ask your health care provider. Do not use street drugs. Do not share needles. Ask your health care provider for help if you need support or information about quitting drugs. General instructions Schedule regular health, dental, and eye exams. Stay current with your vaccines. Tell your health care provider if: You often feel depressed. You have ever been abused or do not feel safe at home. Summary Adopting a healthy lifestyle and getting preventive care are important in promoting health and wellness. Follow your health care provider's instructions about healthy diet, exercising, and getting tested or screened for diseases. Follow your health care provider's instructions on monitoring your cholesterol and blood pressure. This information is not intended to replace advice given to you by your health care provider. Make sure you discuss any questions you have with your health care provider. Document Revised: 11/08/2020 Document Reviewed: 11/08/2020 Elsevier Patient Education  2023 [employer].

## 2022-03-03 NOTE — Progress Notes (Signed)
MEDICARE ANNUAL WELLNESS VISIT  03/03/2022  Telephone Visit Disclaimer This Medicare AWV was conducted by telephone due to national recommendations for restrictions regarding the COVID-19 Pandemic (e.g. social distancing).  I verified, using two identifiers, that I am speaking with Lindsay Moore or their authorized healthcare agent. I discussed the limitations, risks, security, and privacy concerns of performing an evaluation and management service by telephone and the potential availability of an in-person appointment in the future. The patient expressed understanding and agreed to proceed.  Location of Patient: Home Location of Provider (nurse):  In the office.  Subjective:    Lindsay Moore is a 66 y.o. female patient of Samuel Bouche, NP who had a Medicare Annual Wellness Visit today via telephone. [name] is Retired and lives with their spouse. she has 5 children. she reports that she is socially active and does interact with friends/family regularly. she is minimally physically active and enjoys making soap, sewing and doing crafts.  Patient Care Team: Samuel Bouche, NP as PCP - General (Nurse Practitioner) Marcial Pacas, DO (Family Medicine) Darius Bump, [facility] as Pharmacist (Pharmacist)     03/03/2022   11:20 AM 12/14/2020    9:41 AM 03/13/2020   12:20 PM 11/10/2015   10:59 AM 06/25/2015    2:40 PM  Advanced Directives  Does Patient Have a Medical Advance Directive? No No No No No  Would patient like information on creating a medical advance directive? No - Patient declined No - Patient declined  No - patient declined information No - patient declined information    Hospital Utilization Over the Past 12 Months: # of hospitalizations or ER visits: 0 # of surgeries: 0  Review of Systems    Patient reports that her overall health is better compared to last year.  History obtained from chart review and the patient  Patient Reported Readings (BP, Pulse, CBG,  Weight, etc) none  Pain Assessment Pain : No/denies pain     Current Medications & Allergies (verified) Allergies as of 03/03/2022       Reactions   Ibuprofen Shortness Of Breath   Amlodipine Swelling   Bee Venom Other (See Comments)   Lisinopril Swelling        Medication List        Accurate as of March 03, 2022 11:33 AM. If you have any questions, ask your nurse or doctor.          acetaminophen 650 MG CR tablet Commonly known as: TYLENOL Take 1 tablet (650 mg total) by mouth every 8 (eight) hours as needed for pain.   albuterol 108 (90 Base) MCG/ACT inhaler Commonly known as: VENTOLIN HFA Inhale 1-2 puffs into the lungs every 6 (six) hours as needed for wheezing or shortness of breath.   aspirin EC 81 MG tablet Take 1 tablet (81 mg total) by mouth daily.   atorvastatin 10 MG tablet Commonly known as: LIPITOR Take 1 tablet (10 mg total) by mouth daily.   CALCIUM + D3 PO Take by mouth.   carvedilol 12.5 MG tablet Commonly known as: COREG TAKE 1 TABLET BY MOUTH TWICE DAILY WITH A MEAL   HAIR/SKIN/NAILS PO Take by mouth daily.   hydrochlorothiazide 25 MG tablet Commonly known as: HYDRODIURIL Take 1 tablet (25 mg total) by mouth daily. TAKE 1 TABLET BY MOUTH ONCE DAILY.   hydrOXYzine 10 MG tablet Commonly known as: ATARAX Take 1 tablet (10 mg total) by mouth 3 (three) times daily as needed.  MEGARED OMEGA-3 KRILL OIL PO Take by mouth.   multivitamin tablet Take 1 tablet by mouth daily.   omeprazole 20 MG capsule Commonly known as: PRILOSEC Take 1 capsule (20 mg total) by mouth daily.   potassium chloride 10 MEQ tablet Commonly known as: KLOR-CON M Take 1 tablet (10 mEq total) by mouth daily. NEEDS ANNUAL APPOINTMENT IN MAY 2023   potassium chloride 10 MEQ tablet Commonly known as: KLOR-CON Take 10 mEq by mouth daily.   Premarin vaginal cream Generic drug: conjugated estrogens Use 0.5gm topically to the vaginal area once daily for 14  days then reduce to twice weekly.   Restasis 0.05 % ophthalmic emulsion Generic drug: cycloSPORINE 1 drop 2 (two) times daily.   triamcinolone ointment 0.1 % Commonly known as: KENALOG Apply 1 application topically 2 (two) times daily. 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    Hyperlipidemia 06/10/2013   Lumbar strain 01/06/2014   Mild mitral valve regurgitation 05/07/2020   Ocular migraine 06/10/2013   Palpitations 03/17/2020   Postmenopausal bleeding 08/17/2014   Primary osteoarthritis of both knees 08/31/2016   Right knee pain 06/10/2013   Workers Comp: [facility] Dr. Luiz Blare    Septal defect 06/10/2013   Serous otitis media 01/06/2015   Situational anxiety 06/10/2013   TIA (transient ischemic attack) 06/10/2013   Past Surgical History:  Procedure Laterality Date   bicep surgery Right    broken hip  2000   FOOT TENDON SURGERY  2000   GALLBLADDER SURGERY     orthoscopic knee surgery  10/2012   TONSILLECTOMY  1959   TUBAL LIGATION  1988   Family History  Problem Relation Age of Onset   Alcoholism Father    Lung cancer Father    Diabetes Other        grandmother   Hyperlipidemia Mother    Hypertension Mother     Stroke Other        grandmother   Social History   Socioeconomic History   Marital status: Married    Spouse name: Tinnie Gens   Number of children: 5   Years of education: 14   Highest education level: Some college, no degree  Occupational History   Occupation: Runner, broadcasting/film/video   Occupation: Retired.  Tobacco Use   Smoking status: Former    Packs/day: 1.00    Years: 25.00    Total pack years: 25.00    Types: Cigarettes    Quit date: 07/03/1997    Years since quitting: 24.6   Smokeless tobacco: Never  Vaping Use   Vaping Use: Never used  Substance and Sexual Activity   Alcohol use: Not Currently   Drug use: No  Sexual activity: Not on file  Other Topics Concern   Not on file  Social History Narrative   Live with her husband. She enjoys making soap, sewing and doing crafts.   Social Determinants of Health   Financial Resource Strain: Low Risk  (03/03/2022)   Overall Financial Resource Strain (CARDIA)    Difficulty of Paying Living Expenses: Not hard at all  Food Insecurity: No Food Insecurity (03/03/2022)   Hunger Vital Sign    Worried About Running Out of Food in the Last Year: Never true    Ran Out of Food in the Last Year: Never true  Transportation Needs: No Transportation Needs (03/03/2022)   PRAPARE - Administrator, Civil Service (Medical): No    Lack of Transportation (Non-Medical): No  Physical Activity: Sufficiently Active (03/03/2022)   Exercise Vital Sign    Days of Exercise per Week: 5 days    Minutes of Exercise per Session: 30 min  Stress: No Stress Concern Present (03/03/2022)   [employer] of Occupational Health - Occupational Stress Questionnaire    Feeling of Stress : Not at all  Social Connections: Socially Integrated (03/03/2022)   Social Connection and Isolation Panel [NHANES]    Frequency of Communication with Friends and Family: More than three times a week    Frequency of Social Gatherings with Friends and Family: Three times a week    Attends  Religious Services: More than 4 times per year    Active Member of Clubs or Organizations: Yes    Attends Banker Meetings: More than 4 times per year    Marital Status: Married    Activities of Daily Living    03/03/2022   11:23 AM  In your present state of health, do you have any difficulty performing the following activities:  Hearing? 0  Vision? 0  Difficulty concentrating or making decisions? 0  Walking or climbing stairs? 1  Comment difficulty climbing stairs due to knee pain.  Dressing or bathing? 0  Doing errands, shopping? 0  Preparing Food and eating ? N  Using the Toilet? N  In the past six months, have you accidently leaked urine? N  Do you have problems with loss of bowel control? N  Comment when she had diarrhea, she had a couple of accidents.  Managing your Medications? N  Managing your Finances? N  Housekeeping or managing your Housekeeping? N    Patient Education/ Literacy How often do you need to have someone help you when you read instructions, pamphlets, or other written materials from your doctor or pharmacy?: 1 - Never What is the last grade level you completed in school?: Two years of college  Exercise Current Exercise Habits: Home exercise routine, Type of exercise: walking, Time (Minutes): 30, Frequency (Times/Week): 5, Weekly Exercise (Minutes/Week): 150, Intensity: Mild, Exercise limited by: None identified  Diet Patient reports consuming 3 meals a day and 0 snack(s) a day Patient reports that her primary diet is: Regular Patient reports that she does have regular access to food.   Depression Screen    03/03/2022   11:22 AM 05/23/2021    8:25 AM 05/23/2021    8:22 AM 12/14/2020    9:45 AM 11/24/2020   11:32 AM 11/24/2020    9:36 AM 05/26/2020    8:50 AM  PHQ 2/9 Scores  PHQ - 2 Score 0 1 1 0 0 0 1  PHQ- 9 Score  1   0  2  Fall Risk    03/03/2022   11:22 AM 05/23/2021    8:21 AM 12/14/2020    9:42 AM 11/24/2020    9:35 AM  03/16/2020    9:52 AM  Fall Risk   Falls in the past year? 0 1 1 0 1  Number falls in past yr: 0 1 1 0 0  Injury with Fall? 0 0 1 0 1  Risk for fall due to : No Fall Risks  Impaired mobility    Follow up Falls evaluation completed Falls evaluation completed Falls evaluation completed;Education provided Falls evaluation completed Falls evaluation completed     Objective:  Sumitra Deckert seemed alert and oriented and she participated appropriately during our telephone visit.  Blood Pressure Weight BMI  BP Readings from Last 3 Encounters:  11/25/21 (!) 160/86  06/06/21 (!) 156/87  05/23/21 (!) 177/95   Wt Readings from Last 3 Encounters:  11/25/21 158 lb 3.2 oz (71.8 kg)  05/23/21 164 lb 9.6 oz (74.7 kg)  11/24/20 160 lb 12.8 oz (72.9 kg)   BMI Readings from Last 1 Encounters:  11/25/21 26.74 kg/m    *Unable to obtain current vital signs, weight, and BMI due to telephone visit type  Hearing/Vision  Varnika did not seem to have difficulty with hearing/understanding during the telephone conversation Reports that she has had a formal eye exam by an eye care professional within the past year Reports that she has not had a formal hearing evaluation within the past year *Unable to fully assess hearing and vision during telephone visit type  Cognitive Function:    03/03/2022   11:28 AM 12/14/2020    9:51 AM  6CIT Screen  What Year? 0 points 0 points  What month? 0 points 0 points  What time? 0 points 0 points  Count back from 20 0 points 0 points  Months in reverse 0 points 0 points  Repeat phrase 0 points 0 points  Total Score 0 points 0 points   (Normal:0-7, Significant for Dysfunction: >8)  Normal Cognitive Function Screening: Yes   Immunization & Health Maintenance Record Immunization History  Administered Date(s) Administered   DTaP 01/31/2009   Influenza Split 05/17/2011, 05/29/2012   Influenza Whole 04/02/2013   Influenza,inj,Quad PF,6+ Mos 03/18/2014,  05/13/2015, 05/10/2016, 04/12/2017, 04/12/2018, 05/26/2019, 05/26/2020, 06/06/2021   Tdap 10/10/2016   Zoster Recombinat (Shingrix) 10/17/2016, 02/12/2017    Health Maintenance  Topic Date Due   Diabetic kidney evaluation - Urine ACR  03/04/2022 (Originally 09/29/1973)   Pneumonia Vaccine 75+ Years old (1 - PCV) 05/23/2022 (Originally 09/30/2019)   INFLUENZA VACCINE  10/01/2022 (Originally 01/31/2022)   Diabetic kidney evaluation - GFR measurement  11/22/2022   DEXA SCAN  12/04/2022   MAMMOGRAM  08/02/2023   TETANUS/TDAP  10/11/2026   COLONOSCOPY (Pts 45-22yrs Insurance coverage will need to be confirmed)  05/08/2027   Hepatitis C Screening  Completed   Zoster Vaccines- Shingrix  Completed   HPV VACCINES  Aged Out   COVID-19 Vaccine  Discontinued       Assessment  This is a routine wellness examination for [employer].  Health Maintenance: Due or Overdue There are no preventive care reminders to display for this patient.   Telma Sierra Hinz does not need a referral for Community Assistance: Care Management:   no Social Work:    no Prescription Assistance:  no Nutrition/Diabetes Education:  no   Plan:  Personalized Goals  Goals Addressed  This Visit's Progress     Patient Stated (pt-stated)        03/03/2022 AWV Goal: Exercise for General Health  Patient will verbalize understanding of the benefits of increased physical activity: Exercising regularly is important. It will improve your overall fitness, flexibility, and endurance. Regular exercise also will improve your overall health. It can help you control your weight, reduce stress, and improve your bone density. Over the next year, patient will increase physical activity as tolerated with a goal of at least 150 minutes of moderate physical activity per week.  You can tell that you are exercising at a moderate intensity if your heart starts beating faster and you start breathing faster but  can still hold a conversation. Moderate-intensity exercise ideas include: Walking 1 mile (1.6 km) in about 15 minutes Biking Hiking Golfing Dancing Water aerobics Patient will verbalize understanding of everyday activities that increase physical activity by providing examples like the following: Yard work, such as: Insurance underwriter Gardening Washing windows or floors Patient will be able to explain general safety guidelines for exercising:  Before you start a new exercise program, talk with your health care provider. Do not exercise so much that you hurt yourself, feel dizzy, or get very short of breath. Wear comfortable clothes and wear shoes with good support. Drink plenty of water while you exercise to prevent dehydration or heat stroke. Work out until your breathing and your heartbeat get faster.        Personalized Health Maintenance & Screening Recommendations  Pneumococcal vaccine  Influenza vaccine  Lung Cancer Screening Recommended: no (Low Dose CT Chest recommended if Age 55-80 years, 30 pack-year currently smoking OR have quit w/in past 15 years) Hepatitis C Screening recommended: no HIV Screening recommended: no  Advanced Directives: Written information was not prepared per patient's request.  Referrals & Orders No orders of the defined types were placed in this encounter.   Follow-up Plan Follow-up with Christen Butter, NP as planned Schedule your pneumonia and influenza vaccine. Medicare wellness visit in one year. Patient will access AVS on my chart.   I have personally reviewed and noted the following in the patient's chart:   Medical and social history Use of alcohol, tobacco or illicit drugs  Current medications and supplements Functional ability and status Nutritional status Physical activity Advanced directives List of other physicians Hospitalizations, surgeries,  and ER visits in previous 12 months Vitals Screenings to include cognitive, depression, and falls Referrals and appointments  In addition, I have reviewed and discussed with Jasmine Awe certain preventive protocols, quality metrics, and best practice recommendations. A written personalized care plan for preventive services as well as general preventive health recommendations is available and can be mailed to the patient at her request.      Modesto Charon, RN BSN  03/03/2022

## 2022-05-05 ENCOUNTER — Other Ambulatory Visit: Payer: Self-pay | Admitting: Medical-Surgical

## 2022-05-27 ENCOUNTER — Other Ambulatory Visit: Payer: Self-pay | Admitting: Medical-Surgical

## 2022-05-29 MED ORDER — POTASSIUM CHLORIDE ER 10 MEQ PO TBCR: 10.0000 meq | EXTENDED_RELEASE_TABLET | Freq: Every day | ORAL | 0 refills | Status: DC

## 2022-05-29 MED ORDER — HYDROCHLOROTHIAZIDE 25 MG PO TABS: 25.0000 mg | ORAL_TABLET | Freq: Every day | ORAL | 0 refills | Status: DC

## 2022-06-07 ENCOUNTER — Ambulatory Visit (INDEPENDENT_AMBULATORY_CARE_PROVIDER_SITE_OTHER): Payer: Medicare PPO | Admitting: Pharmacist

## 2022-06-07 DIAGNOSIS — E782 Mixed hyperlipidemia: Secondary | ICD-10-CM

## 2022-06-07 DIAGNOSIS — I10 Essential (primary) hypertension: Secondary | ICD-10-CM

## 2022-06-07 NOTE — Progress Notes (Signed)
Chronic Care Management Pharmacy Note  06/07/2022 Name:  Lindsay Moore MRN:  [id number] DOB:  1956-04-20  Summary: addressed HTN, HLD. Patient states she is not regularly checking BP readings, but going based on how she feels, she has no symptoms (headache, dizziness) and feels great about that.  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   Recommendations/Changes made from today's visit:  No changes, pt is doing well.  Plan: f/u with pharmacist in 3-4 months  Subjective: Lindsay Moore is an 66 y.o. year old female who is a primary patient of Samuel Bouche, NP.  The CCM team was consulted for assistance with disease management and care coordination needs.    Engaged with patient by telephone for follow up visit in response to provider referral for pharmacy case management and/or care coordination services.   Consent to Services:  The patient was given information about Chronic Care Management services, agreed to services, and gave verbal consent prior to initiation of services.  Please see initial visit note for detailed documentation.   Patient Care Team: Samuel Bouche, NP as PCP - General (Nurse Practitioner) Marcial Pacas, DO (Family Medicine) Darius Bump, [facility] as Pharmacist (Pharmacist)  Recent office visits:  12/14/20 Samuel Bouche NP- pt was seen for a routine wellness examination. Pt advised to follow up as planned.   11/24/20 Samuel Bouche NP- pt was seen for a annual physical examination. Labs were ordered and an xray of right foot was completed. Pt started on conjugated estrogens (PREMARIN) vaginal cream and advised to follow up in 6 months.   Recent consult visits:  None Noted   Hospital visits:  None in previous 6 months  Objective:  Lab Results  Component Value Date   CREATININE 0.99 11/21/2021   CREATININE 1.09 (H) 05/17/2021    CREATININE 1.24 (H) 01/06/2021       Component Value Date/Time   CHOL 179 11/21/2021 0000   TRIG 116 11/21/2021 0000   HDL 73 11/21/2021 0000   CHOLHDL 2.5 11/21/2021 0000   VLDL 33 (H) 10/03/2016 0826   LDLCALC 85 11/21/2021 0000       Latest Ref Rng & Units 11/21/2021   12:00 AM 05/17/2021   12:00 AM 01/06/2021   12:00 AM  Hepatic Function  Total Protein 6.1 - 8.1 g/dL 6.9  6.8  6.8   AST 10 - 35 U/L 22  28  21    ALT 6 - 29 U/L 17  24  17    Total Bilirubin 0.2 - 1.2 mg/dL 0.7  0.4  0.4     Lab Results  Component Value Date/Time   TSH 1.05 11/21/2021 12:00 AM   TSH 1.49 11/24/2020 12:00 AM   FREET4 1.30 07/26/2015 03:53 PM       Latest Ref Rng & Units 11/21/2021   12:00 AM 05/17/2021   12:00 AM 11/24/2020   12:00 AM  CBC  WBC 3.8 - 10.8 Thousand/uL 7.5  7.5  8.0   Hemoglobin 11.7 - 15.5 g/dL 14.7  14.4  15.0   Hematocrit 35.0 - 45.0 % 45.2  43.9  45.3   Platelets 140 - 400 Thousand/uL 277  307  263     Lab Results  Component Value Date/Time   VD25OH 34 10/21/2018 08:47 AM    Clinical ASCVD:  The 10-year ASCVD risk score (Arnett DK, et al., 2019)  is: 22.4%   Values used to calculate the score:     Age: 13 years     Sex: Female     Is Non-Hispanic African American: No     Diabetic: Yes     Tobacco smoker: No     Systolic Blood Pressure: 160 mmHg     Is BP treated: Yes     HDL Cholesterol: 73 mg/dL     Total Cholesterol: 179 mg/dL     Social History   Tobacco Use  Smoking Status Former   Packs/day: 1.00   Years: 25.00   Total pack years: 25.00   Types: Cigarettes   Quit date: 07/03/1997   Years since quitting: 24.9  Smokeless Tobacco Never   BP Readings from Last 3 Encounters:  11/25/21 (!) 160/86  06/06/21 (!) 156/87  05/23/21 (!) 177/95   Pulse Readings from Last 3 Encounters:  11/25/21 84  06/06/21 90  05/23/21 75   Wt Readings from Last 3 Encounters:  11/25/21 158 lb 3.2 oz (71.8 kg)  05/23/21 164 lb 9.6 oz (74.7 kg)  11/24/20 160 lb  12.8 oz (72.9 kg)    Assessment: Review of patient past medical history, allergies, medications, health status, including review of consultants reports, laboratory and other test data, was performed as part of comprehensive evaluation and provision of chronic care management services.   SDOH:  (Social Determinants of Health) assessments and interventions performed:  SDOH Interventions    Flowsheet Row Clinical Support from 12/14/2020 in [facility]  SDOH Interventions   Food Insecurity Interventions Intervention Not Indicated  Housing Interventions Intervention Not Indicated  Transportation Interventions Intervention Not Indicated  Financial Strain Interventions Intervention Not Indicated  Physical Activity Interventions Intervention Not Indicated  Stress Interventions Intervention Not Indicated  Social Connections Interventions Intervention Not Indicated       CCM Care Plan  Allergies  Allergen Reactions   Ibuprofen Shortness Of Breath   Amlodipine Swelling   Bee Venom Other (See Comments)   Lisinopril Swelling    Medications Reviewed Today     Reviewed by Modesto Charon, RN (Registered Nurse) on 03/03/22 at 1119  Med List Status: <None>   Medication Order Taking? Sig Documenting Provider Last Dose Status Informant  acetaminophen (TYLENOL) 650 MG CR tablet [id number] Yes Take 1 tablet (650 mg total) by mouth every 8 (eight) hours as needed for pain. Monica Becton, MD Taking Active            Med Note (GARNER, TIFFANY L   Wed Mar 17, 2020  1:32 PM)    albuterol (VENTOLIN HFA) 108 (90 Base) MCG/ACT inhaler [id number] Yes Inhale 1-2 puffs into the lungs every 6 (six) hours as needed for wheezing or shortness of breath. Lurene Shadow, PA-C Taking Active            Med Note Amil Amen   Tue Jun 22, 2020  9:36 AM) Never used per pt  aspirin EC 81 MG tablet [id number] Yes Take 1 tablet (81 mg total) by mouth daily. Laren Boom, DO  Taking Active   atorvastatin (LIPITOR) 10 MG tablet [id number] Yes Take 1 tablet (10 mg total) by mouth daily. Christen Butter, NP Taking Active   Biotin w/ Vitamins C & E (HAIR/SKIN/NAILS PO) [id number] No Take by mouth daily.  Patient not taking: Reported on 12/14/2021   [provider] Not Taking Active   Calcium Carb-Cholecalciferol (CALCIUM + D3 PO) [id number] Yes Take by mouth.  [provider] Taking Active   carvedilol (COREG) 12.5 MG tablet [id number] Yes TAKE 1 TABLET BY MOUTH TWICE DAILY WITH A MEAL Jessup, Joy, NP Taking Active   conjugated estrogens (PREMARIN) vaginal cream [id number] Yes Use 0.5gm topically to the vaginal area once daily for 14 days then reduce to twice weekly. Samuel Bouche, NP Taking Active   hydrochlorothiazide (HYDRODIURIL) 25 MG tablet [id number] Yes Take 1 tablet (25 mg total) by mouth daily. TAKE 1 TABLET BY MOUTH ONCE DAILY. Samuel Bouche, NP Taking Active   hydrOXYzine (ATARAX/VISTARIL) 10 MG tablet [id number] Yes Take 1 tablet (10 mg total) by mouth 3 (three) times daily as needed. Samuel Bouche, NP Taking Active   MEGARED OMEGA-3 KRILL OIL PO [id number] Yes Take by mouth. [provider] Taking Active   Multiple Vitamin (MULTIVITAMIN) tablet [id number] Yes Take 1 tablet by mouth daily. [provider] Taking Active   omeprazole (PRILOSEC) 20 MG capsule [id number] Yes Take 1 capsule (20 mg total) by mouth daily. Samuel Bouche, NP Taking Active   potassium chloride (KLOR-CON M) 10 MEQ tablet [id number] Yes Take 1 tablet (10 mEq total) by mouth daily. NEEDS ANNUAL APPOINTMENT IN MAY 2023 Samuel Bouche, NP Taking Active   RESTASIS 0.05 % ophthalmic emulsion [id number] Yes 1 drop 2 (two) times daily. [provider] Taking Active   triamcinolone ointment (KENALOG) 0.1 % [id number] Yes Apply 1 application topically 2 (two) times daily. To affected areas Emeterio Reeve, DO Taking Active             Patient Active Problem List   Diagnosis  Date Noted   Annual physical exam 11/25/2021   Heart defect    AC (acromioclavicular) joint bone spurs 05/07/2020   Mild mitral valve regurgitation 05/07/2020   Palpitations 03/17/2020   Chronic obstructive pulmonary disease with acute exacerbation ([place]) 03/04/2020   Closed displaced fracture of proximal phalanx of third toe of right foot 12/30/2019   Flexural eczema 11/23/2016   Primary osteoarthritis of both knees 08/31/2016   Biceps tendinitis on right 06/22/2015   Serous otitis media 01/06/2015   Postmenopausal bleeding 08/17/2014   History of abnormal cervical Pap smear 06/19/2014   Lumbar strain 01/06/2014   Breast mass 07/11/2013   Encounter for colonoscopy due to history of colonic polyp 06/19/2013   TIA (transient ischemic attack) 06/10/2013   Essential hypertension, benign 06/10/2013   Septal defect 06/10/2013   Ocular migraine 06/10/2013   Situational anxiety 06/10/2013   Hyperlipidemia 06/10/2013   Right knee pain 06/10/2013    Immunization History  Administered Date(s) Administered   DTaP 01/31/2009   Influenza Split 05/17/2011, 05/29/2012   Influenza Whole 04/02/2013   Influenza,inj,Quad PF,6+ Mos 03/18/2014, 05/13/2015, 05/10/2016, 04/12/2017, 04/12/2018, 05/26/2019, 05/26/2020, 06/06/2021   Tdap 10/10/2016   Zoster Recombinat (Shingrix) 10/17/2016, 02/12/2017    Conditions to be addressed/monitored: HTN and HLD  There are no care plans that you recently modified to display for this patient.       Medication Assistance: None required.  Patient affirms current coverage meets needs.  Patient's preferred pharmacy is:  [facility] [address], [place] [address] Phone: [phone] Fax: [phone]   Uses pill box? Yes Pt endorses 100% compliance  Follow Up:  Patient agrees to Care Plan and Follow-up.  Plan: Telephone follow up appointment with care management team member  scheduled for:  3-4 months  Larinda Buttery, PharmD Clinical Pharmacist [facility]  [phone]     

## 2022-06-19 ENCOUNTER — Other Ambulatory Visit: Payer: Self-pay | Admitting: Medical-Surgical

## 2022-06-19 DIAGNOSIS — Z1231 Encounter for screening mammogram for malignant neoplasm of breast: Secondary | ICD-10-CM

## 2022-07-17 ENCOUNTER — Other Ambulatory Visit: Payer: Self-pay | Admitting: Medical-Surgical

## 2022-07-17 ENCOUNTER — Encounter: Payer: Self-pay | Admitting: Medical-Surgical

## 2022-07-17 MED ORDER — AMOXICILLIN 500 MG PO CAPS: 2000.0000 mg | ORAL_CAPSULE | Freq: Once | ORAL | 0 refills | Status: DC

## 2022-08-02 ENCOUNTER — Other Ambulatory Visit: Payer: Self-pay | Admitting: Medical-Surgical

## 2022-08-08 ENCOUNTER — Other Ambulatory Visit: Payer: Self-pay | Admitting: Medical-Surgical

## 2022-08-09 ENCOUNTER — Other Ambulatory Visit: Payer: Self-pay | Admitting: Medical-Surgical

## 2022-08-10 MED ORDER — AMOXICILLIN 500 MG PO CAPS: 2000.0000 mg | ORAL_CAPSULE | Freq: Once | ORAL | 0 refills | Status: AC

## 2022-08-14 ENCOUNTER — Ambulatory Visit
Admission: RE | Admit: 2022-08-14 | Discharge: 2022-08-14 | Disposition: A | Discharge status: Home / Self Care | Payer: Medicare PPO | Source: Ambulatory Visit | Attending: Medical-Surgical | Admitting: Medical-Surgical

## 2022-08-14 DIAGNOSIS — Z1231 Encounter for screening mammogram for malignant neoplasm of breast: Secondary | ICD-10-CM | POA: Diagnosis not present

## 2022-08-17 ENCOUNTER — Other Ambulatory Visit: Payer: Self-pay | Admitting: Medical-Surgical

## 2022-08-17 DIAGNOSIS — R928 Other abnormal and inconclusive findings on diagnostic imaging of breast: Secondary | ICD-10-CM

## 2022-08-22 ENCOUNTER — Telehealth: Payer: Self-pay | Admitting: Medical-Surgical

## 2022-08-22 ENCOUNTER — Other Ambulatory Visit: Payer: Self-pay | Admitting: Medical-Surgical

## 2022-08-22 ENCOUNTER — Ambulatory Visit
Admission: RE | Admit: 2022-08-22 | Discharge: 2022-08-22 | Disposition: A | Discharge status: Home / Self Care | Payer: Medicare PPO | Source: Ambulatory Visit | Attending: Medical-Surgical | Admitting: Medical-Surgical

## 2022-08-22 DIAGNOSIS — R928 Other abnormal and inconclusive findings on diagnostic imaging of breast: Secondary | ICD-10-CM | POA: Diagnosis not present

## 2022-08-22 DIAGNOSIS — R921 Mammographic calcification found on diagnostic imaging of breast: Secondary | ICD-10-CM

## 2022-08-22 NOTE — Telephone Encounter (Signed)
Contacted Lindsay Moore to schedule their annual wellness visit. Appointment made for 03/14/23 at [place] [facility] Direct Dial: [phone]

## 2022-08-24 ENCOUNTER — Other Ambulatory Visit: Payer: Self-pay | Admitting: Medical-Surgical

## 2022-08-28 ENCOUNTER — Ambulatory Visit
Admission: RE | Admit: 2022-08-28 | Discharge: 2022-08-28 | Disposition: A | Discharge status: Home / Self Care | Payer: Medicare PPO | Source: Ambulatory Visit | Attending: Medical-Surgical | Admitting: Medical-Surgical

## 2022-08-28 DIAGNOSIS — R928 Other abnormal and inconclusive findings on diagnostic imaging of breast: Secondary | ICD-10-CM

## 2022-08-28 DIAGNOSIS — N6012 Diffuse cystic mastopathy of left breast: Secondary | ICD-10-CM | POA: Diagnosis not present

## 2022-08-28 DIAGNOSIS — R921 Mammographic calcification found on diagnostic imaging of breast: Secondary | ICD-10-CM

## 2022-08-28 HISTORY — PX: BREAST BIOPSY: SHX20

## 2022-09-13 ENCOUNTER — Telehealth: Payer: Medicare PPO

## 2022-09-13 ENCOUNTER — Other Ambulatory Visit: Payer: Medicare PPO | Admitting: Pharmacist

## 2022-09-13 NOTE — Patient Instructions (Signed)
Gerald Stabs,  It was a pleasure speaking with you today! As discussed, continue your routine and taking medications as prescribed. Since all is looking well at this time, we will follow up as needed. However, send me a mychart message if you have any questions or need anything! I am happy to help.  Take care, Lysle Morales

## 2022-09-13 NOTE — Progress Notes (Signed)
09/13/2022 Name: Lindsay Moore MRN: [id number] DOB: 1956-06-15  Chief Complaint  Patient presents with   Hypertension   Hyperlipidemia    Lindsay Moore is a 67 y.o. year old female who presented for a telephone visit.   They were referred to the pharmacist by their PCP for assistance in managing hypertension and hyperlipidemia.    Subjective:  Care Team: Primary Care Provider: Samuel Bouche, NP  Medication Access/Adherence  Current Pharmacy:  [facility] [address] [place] [address] Phone: [phone] Fax: [phone]   Patient reports affordability concerns with their medications: No  Patient reports access/transportation concerns to their pharmacy: No  Patient reports adherence concerns with their medications:  No     Hypertension:  Current medications: carvedilol 12.'5mg'$  BID, hctz '25mg'$  daily  Patient has a validated, automated, upper arm home BP cuff Current blood pressure readings readings: not checking frequently, usually 140/90s  Patient denies hypotensive s/sx including dizziness, lightheadedness.  Patient denies hypertensive symptoms including headache, chest pain, shortness of breath    Hyperlipidemia/ASCVD Risk Reduction  Current lipid lowering medications: atorvastatin '10mg'$  daily   Objective:  Lab Results  Component Value Date   CREATININE 0.99 11/21/2021   BUN 14 11/21/2021   NA 139 11/21/2021   K 3.6 11/21/2021   CL 100 11/21/2021   CO2 29 11/21/2021    Lab Results  Component Value Date   CHOL 179 11/21/2021   HDL 73 11/21/2021   LDLCALC 85 11/21/2021   TRIG 116 11/21/2021   CHOLHDL 2.5 11/21/2021    Medications Reviewed Today     Reviewed by Darius Bump, [place] (Pharmacist) on 09/13/22 at 1210  Med List Status: <None>   Medication Order Taking? Sig Documenting Provider Last Dose Status Informant  acetaminophen (TYLENOL) 650 MG CR tablet  [id number] Yes Take 1 tablet (650 mg total) by mouth every 8 (eight) hours as needed for pain. Silverio Decamp, MD Taking Active            Med Note (GARNER, TIFFANY L   Wed Mar 17, 2020  1:32 PM)    albuterol (VENTOLIN HFA) 108 (90 Base) MCG/ACT inhaler [id number] Yes Inhale 1-2 puffs into the lungs every 6 (six) hours as needed for wheezing or shortness of breath. Noe Gens, PA-C Taking Active            Med Note Page Spiro   Tue Jun 22, 2020  9:36 AM) Never used per pt  aspirin EC 81 MG tablet [id number] Yes Take 1 tablet (81 mg total) by mouth daily. Marcial Pacas, DO Taking Active   atorvastatin (LIPITOR) 10 MG tablet [id number] Yes Take 1 tablet (10 mg total) by mouth daily. Samuel Bouche, NP Taking Active   Biotin w/ Vitamins C & E (HAIR/SKIN/NAILS PO) [id number] No Take by mouth daily.  Patient not taking: Reported on 12/14/2021   [provider] Not Taking Active   Calcium Carb-Cholecalciferol (CALCIUM + D3 PO) [id number] Yes Take by mouth. [provider] Taking Active   carvedilol (COREG) 12.5 MG tablet [id number] Yes TAKE 1 TABLET BY MOUTH TWICE DAILY WITH A MEAL Jessup, Joy, NP Taking Active   conjugated estrogens (PREMARIN) vaginal cream [id number] Yes Use 0.5gm topically to the vaginal area once daily for 14 days then reduce to twice weekly. Samuel Bouche, NP Taking Active   hydrochlorothiazide (HYDRODIURIL) 25 MG tablet [id number] Yes Take 1 tablet by mouth once daily  Samuel Bouche, NP Taking Active   hydrOXYzine (ATARAX/VISTARIL) 10 MG tablet [id number] Yes Take 1 tablet (10 mg total) by mouth 3 (three) times daily as needed. Samuel Bouche, NP Taking Active   MEGARED OMEGA-3 KRILL OIL PO [id number] Yes Take by mouth. [provider] Taking Active   Multiple Vitamin (MULTIVITAMIN) tablet [id number] Yes Take 1 tablet by mouth daily. [provider] Taking Active   omeprazole (PRILOSEC) 20 MG capsule [id number] Yes Take 1 capsule (20 mg total) by mouth  daily. Samuel Bouche, NP Taking Active   potassium chloride (KLOR-CON) 10 MEQ tablet [id number]  Take 1 tablet by mouth once daily Samuel Bouche, NP  Active   RESTASIS 0.05 % ophthalmic emulsion [id number] Yes 1 drop 2 (two) times daily. [provider] Taking Active   triamcinolone ointment (KENALOG) 0.1 % [id number] Yes Apply 1 application topically 2 (two) times daily. To affected areas Emeterio Reeve, DO Taking Active               Assessment/Plan:   Hypertension: - Currently controlled - Reviewed appropriate blood pressure monitoring technique and reviewed goal blood pressure. Recommended to check home blood pressure and heart rate periodically - Recommend to continue current regimen      Hyperlipidemia/ASCVD Risk Reduction: - Currently controlled.  - Recommend to continue current regimen   Follow Up Plan: as needed  Larinda Buttery, PharmD Clinical Pharmacist [facility] [phone]

## 2022-09-15 DIAGNOSIS — N6489 Other specified disorders of breast: Secondary | ICD-10-CM | POA: Diagnosis not present

## 2022-10-19 ENCOUNTER — Other Ambulatory Visit: Payer: Self-pay | Admitting: Pharmacist

## 2022-10-19 NOTE — Progress Notes (Signed)
10/19/2022 Name: Lindsay Moore MRN: [id number] DOB: [birth year]   Lindsay Moore is a 67 y.o. year old female who presented for a telephone visit.   They were referred to the pharmacist by a quality report for assistance in managing quality metric: controlling blood pressure (CBP) .   Subjective:  Care Team: Primary Care Provider: Christen Butter, NP ; Next Scheduled Visit: 11/30/22  Medication Access/Adherence  Current Pharmacy:  [facility] [address] [place] - [address] [place] [address] Phone: [phone] Fax: [phone]   Patient reports affordability concerns with their medications: No  Patient reports access/transportation concerns to their pharmacy: No  Patient reports adherence concerns with their medications:  No     Hypertension:  Current medications: carvedilol 12.5mg  BID, hctz  daily  Patient has a validated, automated, upper arm home BP cuff Current blood pressure readings readings: has been a little while since last checked, called patient when not home so she could not recall her numbers from BP log  Patient denies hypotensive s/sx including dizziness, lightheadedness.  Patient denies hypertensive symptoms including headache, chest pain, shortness of breath  Objective:  No results found for: "HGBA1C"  Lab Results  Component Value Date   CREATININE 0.99 11/21/2021   BUN 14 11/21/2021   NA 139 11/21/2021   K 3.6 11/21/2021   CL 100 11/21/2021   CO2 29 11/21/2021    Lab Results  Component Value Date   CHOL 179 11/21/2021   HDL 73 11/21/2021   LDLCALC 85 11/21/2021   TRIG 116 11/21/2021   CHOLHDL 2.5 11/21/2021    Medications Reviewed Today     Reviewed by Gabriel Carina, RPH (Pharmacist) on 09/13/22 at 1210  Med List Status: <None>   Medication Order Taking? Sig Documenting Provider Last Dose Status Informant  acetaminophen (TYLENOL) 650 MG CR tablet [id number] Yes Take 1  tablet (650 mg total) by mouth every 8 (eight) hours as needed for pain. Monica Becton, MD Taking Active            Med Note (GARNER, TIFFANY L   Wed Mar 17, 2020  1:32 PM)    albuterol (VENTOLIN HFA) 108 (90 Base) MCG/ACT inhaler [id number] Yes Inhale 1-2 puffs into the lungs every 6 (six) hours as needed for wheezing or shortness of breath. Lurene Shadow, PA-C Taking Active            Med Note Amil Amen   Tue Jun 22, 2020  9:36 AM) Never used per pt  aspirin EC 81 MG tablet [id number] Yes Take 1 tablet (81 mg total) by mouth daily. Laren Boom, DO Taking Active   atorvastatin (LIPITOR) 10 MG tablet [id number] Yes Take 1 tablet (10 mg total) by mouth daily. Christen Butter, NP Taking Active   Biotin w/ Vitamins C & E (HAIR/SKIN/NAILS PO) [id number] No Take by mouth daily.  Patient not taking: Reported on 12/14/2021   [provider] Not Taking Active   Calcium Carb-Cholecalciferol (CALCIUM + D3 PO) [id number] Yes Take by mouth. [provider] Taking Active   carvedilol (COREG) 12.5 MG tablet [id number] Yes TAKE 1 TABLET BY MOUTH TWICE DAILY WITH A MEAL Jessup, Joy, NP Taking Active   conjugated estrogens (PREMARIN) vaginal cream [id number] Yes Use 0.5gm topically to the vaginal area once daily for 14 days then reduce to twice weekly. Christen Butter, NP Taking Active   hydrochlorothiazide (HYDRODIURIL) 25 MG tablet [id number] Yes Take 1 tablet  by mouth once daily Christen Butter, NP Taking Active   hydrOXYzine (ATARAX/VISTARIL) 10 MG tablet [id number] Yes Take 1 tablet (10 mg total) by mouth 3 (three) times daily as needed. Christen Butter, NP Taking Active   MEGARED OMEGA-3 KRILL OIL PO [id number] Yes Take by mouth. [provider] Taking Active   Multiple Vitamin (MULTIVITAMIN) tablet [id number] Yes Take 1 tablet by mouth daily. [provider] Taking Active   omeprazole (PRILOSEC) 20 MG capsule [id number] Yes Take 1 capsule (20 mg total) by mouth daily. Christen Butter, NP  Taking Active   potassium chloride (KLOR-CON) 10 MEQ tablet [id number]  Take 1 tablet by mouth once daily Christen Butter, NP  Active   RESTASIS 0.05 % ophthalmic emulsion [id number] Yes 1 drop 2 (two) times daily. [provider] Taking Active   triamcinolone ointment (KENALOG) 0.1 % [id number] Yes Apply 1 application topically 2 (two) times daily. To affected areas Sunnie Nielsen, DO Taking Active               Assessment/Plan:   Hypertension: - Currently controlled - Reviewed long term cardiovascular and renal outcomes of uncontrolled blood pressure - Reviewed appropriate blood pressure monitoring technique and reviewed goal blood pressure. Recommended to check home blood pressure and heart rate periodically - Recommend to continue current regimen    Lynnda Shields, PharmD, BCPS Clinical Pharmacist [facility]

## 2022-10-19 NOTE — Patient Instructions (Signed)
Thayer Ohm,  So great chatting with you today! Continue medicines as normal, no change or concerns - don't forget to spot check that blood pressure at home occasionally - maybe once every 1-2 weeks. Bring some of those numbers to Joy when you see her in May. You're doing great!!  Take care,  Elmarie Shiley, PharmD, BCPS Clinical Pharmacist [facility]

## 2022-11-06 ENCOUNTER — Other Ambulatory Visit: Payer: Self-pay | Admitting: Medical-Surgical

## 2022-11-13 ENCOUNTER — Other Ambulatory Visit: Payer: Self-pay | Admitting: Medical-Surgical

## 2022-11-15 DIAGNOSIS — H524 Presbyopia: Secondary | ICD-10-CM | POA: Diagnosis not present

## 2022-11-15 DIAGNOSIS — H2513 Age-related nuclear cataract, bilateral: Secondary | ICD-10-CM | POA: Diagnosis not present

## 2022-11-28 ENCOUNTER — Other Ambulatory Visit: Payer: Self-pay | Admitting: Medical-Surgical

## 2022-11-30 ENCOUNTER — Ambulatory Visit (INDEPENDENT_AMBULATORY_CARE_PROVIDER_SITE_OTHER): Payer: Medicare PPO | Admitting: Medical-Surgical

## 2022-11-30 ENCOUNTER — Encounter: Payer: Self-pay | Admitting: Medical-Surgical

## 2022-11-30 ENCOUNTER — Ambulatory Visit (INDEPENDENT_AMBULATORY_CARE_PROVIDER_SITE_OTHER): Payer: Medicare PPO

## 2022-11-30 VITALS — BP 169/92 | HR 78 | Resp 20 | Ht 64.5 in | Wt 154.5 lb

## 2022-11-30 DIAGNOSIS — Z0000 Encounter for general adult medical examination without abnormal findings: Secondary | ICD-10-CM | POA: Diagnosis not present

## 2022-11-30 DIAGNOSIS — I10 Essential (primary) hypertension: Secondary | ICD-10-CM

## 2022-11-30 DIAGNOSIS — M79671 Pain in right foot: Secondary | ICD-10-CM

## 2022-11-30 DIAGNOSIS — Z23 Encounter for immunization: Secondary | ICD-10-CM

## 2022-11-30 DIAGNOSIS — E785 Hyperlipidemia, unspecified: Secondary | ICD-10-CM | POA: Diagnosis not present

## 2022-11-30 DIAGNOSIS — E782 Mixed hyperlipidemia: Secondary | ICD-10-CM

## 2022-11-30 NOTE — Progress Notes (Signed)
Complete physical exam  Patient: Lindsay Moore   DOB: [birth year]   67 y.o. Female  MRN: [id number]  Subjective:    Chief Complaint  Patient presents with   Annual Exam    Lindsay Moore is a 67 y.o. female who presents today for a complete physical exam. She reports consuming a general diet. The patient has a physically strenuous job, but has no regular exercise apart from work.  She generally feels well. She reports sleeping fairly well. She does not have additional problems to discuss today.    Most recent fall risk assessment:    11/30/2022    9:45 AM  Fall Risk   Falls in the past year? 0  Number falls in past yr: 0  Injury with Fall? 0  Risk for fall due to : No Fall Risks  Follow up Falls evaluation completed     Most recent depression screenings:    11/30/2022    9:45 AM 03/03/2022   11:22 AM  PHQ 2/9 Scores  PHQ - 2 Score 0 0    Vision:Within last year and Dental: No current dental problems and Receives regular dental care    Patient Care Team: Christen Butter, NP as PCP - General (Nurse Practitioner) Laren Boom, DO (Family Medicine) Gabriel Carina, [facility] as Pharmacist (Pharmacist)   Outpatient Medications Prior to Visit  Medication Sig   acetaminophen (TYLENOL) 650 MG CR tablet Take 1 tablet (650 mg total) by mouth every 8 (eight) hours as needed for pain.   albuterol (VENTOLIN HFA) 108 (90 Base) MCG/ACT inhaler Inhale 1-2 puffs into the lungs every 6 (six) hours as needed for wheezing or shortness of breath.   aspirin EC 81 MG tablet Take 1 tablet (81 mg total) by mouth daily.   atorvastatin (LIPITOR) 10 MG tablet Take 1 tablet by mouth once daily   Calcium Carb-Cholecalciferol (CALCIUM + D3 PO) Take by mouth.   carvedilol (COREG) 12.5 MG tablet TAKE 1 TABLET BY MOUTH TWICE DAILY WITH A MEAL   conjugated estrogens (PREMARIN) vaginal cream Use 0.5gm topically to the vaginal area once daily for 14 days then reduce to twice weekly.    hydrochlorothiazide (HYDRODIURIL) 25 MG tablet Take 1 tablet by mouth once daily   hydrOXYzine (ATARAX/VISTARIL) 10 MG tablet Take 1 tablet (10 mg total) by mouth 3 (three) times daily as needed.   MEGARED OMEGA-3 KRILL OIL PO Take by mouth.   omeprazole (PRILOSEC) 20 MG capsule Take 1 capsule (20 mg total) by mouth daily.   potassium chloride (KLOR-CON) 10 MEQ tablet Take 1 tablet by mouth once daily   RESTASIS 0.05 % ophthalmic emulsion 1 drop 2 (two) times daily.   triamcinolone ointment (KENALOG) 0.1 % Apply 1 application topically 2 (two) times daily. To affected areas   [DISCONTINUED] Biotin w/ Vitamins C & E (HAIR/SKIN/NAILS PO) Take by mouth daily. (Patient not taking: Reported on 12/14/2021)   [DISCONTINUED] Multiple Vitamin (MULTIVITAMIN) tablet Take 1 tablet by mouth daily. (Patient not taking: Reported on 11/30/2022)   No facility-administered medications prior to visit.    Review of Systems  Constitutional:  Negative for chills, fever, malaise/fatigue and weight loss.  HENT:  Negative for congestion, ear pain, hearing loss, sinus pain and sore throat.   Eyes:  Negative for blurred vision, photophobia and pain.  Respiratory:  Negative for cough, shortness of breath and wheezing.   Cardiovascular:  Negative for chest pain, palpitations and leg swelling.  Gastrointestinal:  Negative for abdominal  pain, constipation, diarrhea, heartburn, nausea and vomiting.  Genitourinary:  Negative for dysuria, frequency and urgency.  Musculoskeletal:  Negative for falls and neck pain.  Skin:  Negative for itching and rash.  Neurological:  Negative for dizziness, weakness and headaches.  Endo/Heme/Allergies:  Negative for polydipsia. Does not bruise/bleed easily.  Psychiatric/Behavioral:  Negative for depression, substance abuse and suicidal ideas. The patient is not nervous/anxious.      Objective:    BP (!) 158/96 (BP Location: Right Arm, Cuff Size: Normal)   Pulse 86   Resp 20   Ht 5' 4.5"  (1.638 m)   Wt 154 lb 8 oz (70.1 kg)   SpO2 94%   BMI 26.11 kg/m    Physical Exam Vitals reviewed.  Constitutional:      General: She is not in acute distress.    Appearance: Normal appearance. She is not ill-appearing.  HENT:     Head: Normocephalic and atraumatic.     Right Ear: Tympanic membrane, ear canal and external ear normal. There is no impacted cerumen.     Left Ear: Tympanic membrane, ear canal and external ear normal. There is no impacted cerumen.     Nose: Nose normal. No congestion or rhinorrhea.     Mouth/Throat:     Mouth: Mucous membranes are moist.     Pharynx: No oropharyngeal exudate or posterior oropharyngeal erythema.  Eyes:     General: No scleral icterus.       Right eye: No discharge.        Left eye: No discharge.     Extraocular Movements: Extraocular movements intact.     Conjunctiva/sclera: Conjunctivae normal.     Pupils: Pupils are equal, round, and reactive to light.  Neck:     Thyroid: No thyromegaly.     Vascular: No carotid bruit or JVD.     Trachea: Trachea normal.  Cardiovascular:     Rate and Rhythm: Normal rate and regular rhythm.     Pulses: Normal pulses.     Heart sounds: Normal heart sounds. No murmur heard.    No friction rub. No gallop.  Pulmonary:     Effort: Pulmonary effort is normal. No respiratory distress.     Breath sounds: Normal breath sounds. No wheezing.  Abdominal:     General: Bowel sounds are normal. There is no distension.     Palpations: Abdomen is soft.     Tenderness: There is no abdominal tenderness. There is no guarding.  Musculoskeletal:        General: Normal range of motion.     Cervical back: Normal range of motion and neck supple.  Lymphadenopathy:     Cervical: No cervical adenopathy.  Skin:    General: Skin is warm and dry.  Neurological:     Mental Status: She is alert and oriented to person, place, and time.     Cranial Nerves: No cranial nerve deficit.  Psychiatric:        Mood and Affect:  Mood normal.        Behavior: Behavior normal.        Thought Content: Thought content normal.        Judgment: Judgment normal.   No results found for any visits on 11/30/22.     Assessment & Plan:    Routine Health Maintenance and Physical Exam  Immunization History  Administered Date(s) Administered   DTaP 01/31/2009   Influenza Split 05/17/2011, 05/29/2012   Influenza Whole 04/02/2013   Influenza,inj,Quad  PF,6+ Mos 03/18/2014, 05/13/2015, 05/10/2016, 04/12/2017, 04/12/2018, 05/26/2019, 05/26/2020, 06/06/2021   Pneumococcal Conjugate-13 11/30/2022   Tdap 10/10/2016   Zoster Recombinat (Shingrix) 10/17/2016, 02/12/2017    Health Maintenance  Topic Date Due   Diabetic kidney evaluation - Urine ACR  Never done   Diabetic kidney evaluation - eGFR measurement  11/22/2022   DEXA SCAN  12/04/2022   INFLUENZA VACCINE  02/01/2023   Medicare Annual Wellness (AWV)  03/04/2023   Pneumonia Vaccine 47+ Years old (2 of 2 - PPSV23 or PCV20) 11/30/2023   MAMMOGRAM  08/22/2024   DTaP/Tdap/Td (3 - Td or Tdap) 10/11/2026   Colonoscopy  05/08/2027   Hepatitis C Screening  Completed   Zoster Vaccines- Shingrix  Completed   HPV VACCINES  Aged Out   COVID-19 Vaccine  Discontinued    Discussed health benefits of physical activity, and encouraged her to engage in regular exercise appropriate for her age and condition.  1. Annual physical exam Checking labs as below.  Up-to-date on preventative care.  Wellness information provided with AVS. - Lipid panel - COMPLETE METABOLIC PANEL WITH GFR - CBC with Differential/Platelet  2. Mixed hyperlipidemia Checking labs today. Continue Lipitor as prescribed.  3. Essential hypertension, benign Blood pressure elevated on arrival at 158/96.  Recheck still elevated, slightly worse.  Unfortunately she does have quite a bit of anxiety surrounding blood pressure management.  They do have a blood pressure monitor at home however it was checked today and  shows to be inaccurate.  Recommend getting a new blood pressure monitor the measures on the arm and monitoring blood pressure at home.  Plan to reach out to her via MyChart/phone call in 2 weeks to see how her home blood pressures are doing.  4. Right foot pain Has been having some dorsal right foot pain that extends from the top of the foot to the first and second toes.  Getting x-ray today. - DG Foot Complete Right; Future  5. Need for vaccination with 13-polyvalent pneumococcal conjugate vaccine Prevnar 13 given in office today. - Pneumococcal conjugate vaccine 13-valent   Return in about 1 year (around 11/30/2023) for annual physical exam.   Christen Butter, NP

## 2022-12-01 ENCOUNTER — Encounter: Payer: Self-pay | Admitting: Medical-Surgical

## 2022-12-01 LAB — COMPLETE METABOLIC PANEL WITH GFR
AG Ratio: 1.6 (calc) (ref 1.0–2.5)
ALT: 18 U/L (ref 6–29)
AST: 23 U/L (ref 10–35)
Albumin: 4.6 g/dL (ref 3.6–5.1)
Alkaline phosphatase (APISO): 107 U/L (ref 37–153)
BUN/Creatinine Ratio: 11 (calc) (ref 6–22)
BUN: 12 mg/dL (ref 7–25)
CO2: 30 mmol/L (ref 20–32)
Calcium: 10.2 mg/dL (ref 8.6–10.4)
Chloride: 100 mmol/L (ref 98–110)
Creat: 1.08 mg/dL — ABNORMAL HIGH (ref 0.50–1.05)
Globulin: 2.8 g/dL (calc) (ref 1.9–3.7)
Glucose, Bld: 99 mg/dL (ref 65–99)
Potassium: 4.6 mmol/L (ref 3.5–5.3)
Sodium: 141 mmol/L (ref 135–146)
Total Bilirubin: 0.7 mg/dL (ref 0.2–1.2)
Total Protein: 7.4 g/dL (ref 6.1–8.1)
eGFR: 56 mL/min/{1.73_m2} — ABNORMAL LOW (ref >=60)

## 2022-12-01 LAB — CBC WITH DIFFERENTIAL/PLATELET
Absolute Monocytes: 640 cells/uL (ref 200–950)
Basophils Absolute: 64 cells/uL (ref 0–200)
Basophils Relative: 0.8 %
Eosinophils Absolute: 432 cells/uL (ref 15–500)
Eosinophils Relative: 5.4 %
HCT: 46.2 % — ABNORMAL HIGH (ref 35.0–45.0)
Hemoglobin: 15 g/dL (ref 11.7–15.5)
Lymphs Abs: 1984 cells/uL (ref 850–3900)
MCH: 28.8 pg (ref 27.0–33.0)
MCHC: 32.5 g/dL (ref 32.0–36.0)
MCV: 88.7 fL (ref 80.0–100.0)
MPV: 10.5 fL (ref 7.5–12.5)
Monocytes Relative: 8 %
Neutro Abs: 4880 cells/uL (ref 1500–7800)
Neutrophils Relative %: 61 %
Platelets: 346 10*3/uL (ref 140–400)
RBC: 5.21 10*6/uL — ABNORMAL HIGH (ref 3.80–5.10)
RDW: 11.8 % (ref 11.0–15.0)
Total Lymphocyte: 24.8 %
WBC: 8 10*3/uL (ref 3.8–10.8)

## 2022-12-01 LAB — LIPID PANEL
Cholesterol: 180 mg/dL (ref <200)
HDL: 74 mg/dL (ref >=50)
LDL Cholesterol (Calc): 86 mg/dL (calc)
Non-HDL Cholesterol (Calc): 106 mg/dL (calc) (ref <130)
Total CHOL/HDL Ratio: 2.4 (calc) (ref <5.0)
Triglycerides: 106 mg/dL (ref <150)

## 2022-12-05 ENCOUNTER — Other Ambulatory Visit: Payer: Self-pay | Admitting: Medical-Surgical

## 2022-12-15 ENCOUNTER — Encounter: Payer: Self-pay | Admitting: Medical-Surgical

## 2022-12-29 ENCOUNTER — Other Ambulatory Visit: Payer: Self-pay | Admitting: Medical-Surgical

## 2023-01-02 ENCOUNTER — Other Ambulatory Visit: Payer: Self-pay | Admitting: Medical-Surgical

## 2023-01-03 MED ORDER — ESTRADIOL 0.1 MG/GM VA CREA: TOPICAL_CREAM | VAGINAL | 12 refills | Status: DC

## 2023-02-03 ENCOUNTER — Other Ambulatory Visit: Payer: Self-pay | Admitting: Medical-Surgical

## 2023-02-07 ENCOUNTER — Other Ambulatory Visit: Payer: Self-pay | Admitting: Medical-Surgical

## 2023-02-13 ENCOUNTER — Other Ambulatory Visit: Payer: Self-pay | Admitting: Medical-Surgical

## 2023-02-20 ENCOUNTER — Other Ambulatory Visit: Payer: Self-pay | Admitting: Medical-Surgical

## 2023-03-05 ENCOUNTER — Other Ambulatory Visit: Payer: Self-pay | Admitting: Medical-Surgical

## 2023-03-12 ENCOUNTER — Other Ambulatory Visit: Payer: Self-pay | Admitting: General Surgery

## 2023-03-12 DIAGNOSIS — R928 Other abnormal and inconclusive findings on diagnostic imaging of breast: Secondary | ICD-10-CM

## 2023-03-13 ENCOUNTER — Ambulatory Visit (INDEPENDENT_AMBULATORY_CARE_PROVIDER_SITE_OTHER): Payer: Medicare PPO | Admitting: Medical-Surgical

## 2023-03-13 DIAGNOSIS — Z780 Asymptomatic menopausal state: Secondary | ICD-10-CM | POA: Diagnosis not present

## 2023-03-13 DIAGNOSIS — Z0000 Encounter for general adult medical examination without abnormal findings: Secondary | ICD-10-CM

## 2023-03-13 NOTE — Patient Instructions (Addendum)
MEDICARE ANNUAL WELLNESS VISIT Health Maintenance Summary and Written Plan of Care  Ms. Lindsay Moore ,  Thank you for allowing me to perform your Medicare Annual Wellness Visit and for your ongoing commitment to your health.   Health Maintenance & Immunization History Health Maintenance  Topic Date Due   Diabetic kidney evaluation - Urine ACR  03/14/2023 (Originally 09/29/1973)   INFLUENZA VACCINE  10/01/2023 (Originally 02/01/2023)   DEXA SCAN  03/12/2024 (Originally 12/04/2022)   Diabetic kidney evaluation - eGFR measurement  11/30/2023   Pneumonia Vaccine 46+ Years old (2 of 2 - PPSV23 or PCV20) 11/30/2023   Medicare Annual Wellness (AWV)  03/12/2024   MAMMOGRAM  08/22/2024   DTaP/Tdap/Td (3 - Td or Tdap) 10/11/2026   Colonoscopy  05/08/2027   Hepatitis C Screening  Completed   Zoster Vaccines- Shingrix  Completed   HPV VACCINES  Aged Out   COVID-19 Vaccine  Discontinued   Immunization History  Administered Date(s) Administered   DTaP 01/31/2009   Influenza Split 05/17/2011, 05/29/2012   Influenza Whole 04/02/2013   Influenza,inj,Quad PF,6+ Mos 03/18/2014, 05/13/2015, 05/10/2016, 04/12/2017, 04/12/2018, 05/26/2019, 05/26/2020, 06/06/2021   Pneumococcal Conjugate-13 11/30/2022   Tdap 10/10/2016   Zoster Recombinant(Shingrix) 10/17/2016, 02/12/2017    These are the patient goals that we discussed:  Goals Addressed               This Visit's Progress     Patient Stated (pt-stated)        Patient stated that she would like to walk more and be more active.         This is a list of Health Maintenance Items that are overdue or due now: Bone densitometry screening Urine ACR Influenza vaccine - patient declined  Orders/Referrals Placed Today: Orders Placed This Encounter  Procedures   DEXAScan    Standing Status:   Future    Standing Expiration Date:   03/12/2024    Scheduling Instructions:     Please call patient to schedule.    Order Specific Question:   Reason for  exam:    Answer:   post menopausal    Order Specific Question:   Preferred imaging location?    Answer:   [facility]    (Contact our referral department at [phone] if you have not spoken with someone about your referral appointment within the next 5 days)    Follow-up Plan Follow-up with Christen Butter, NP as planned Urine ACR can be completed at the next office visit.  Medicare wellness visit in one year.  Patient will access AVS on my chart.      Health Maintenance, Female Adopting a healthy lifestyle and getting preventive care are important in promoting health and wellness. Ask your health care provider about: The right schedule for you to have regular tests and exams. Things you can do on your own to prevent diseases and keep yourself healthy. What should I know about diet, weight, and exercise? Eat a healthy diet  Eat a diet that includes plenty of vegetables, fruits, low-fat dairy products, and lean protein. Do not eat a lot of foods that are high in solid fats, added sugars, or sodium. Maintain a healthy weight Body mass index (BMI) is used to identify weight problems. It estimates body fat based on height and weight. Your health care provider can help determine your BMI and help you achieve or maintain a healthy weight. Get regular exercise Get regular exercise. This is one of the most important things you can  do for your health. Most adults should: Exercise for at least 150 minutes each week. The exercise should increase your heart rate and make you sweat (moderate-intensity exercise). Do strengthening exercises at least twice a week. This is in addition to the moderate-intensity exercise. Spend less time sitting. Even light physical activity can be beneficial. Watch cholesterol and blood lipids Have your blood tested for lipids and cholesterol at 69 years of age, then have this test every 5 years. Have your cholesterol levels checked more often if: Your  lipid or cholesterol levels are high. You are older than 68 years of age. You are at high risk for heart disease. What should I know about cancer screening? Depending on your health history and family history, you may need to have cancer screening at various ages. This may include screening for: Breast cancer. Cervical cancer. Colorectal cancer. Skin cancer. Lung cancer. What should I know about heart disease, diabetes, and high blood pressure? Blood pressure and heart disease High blood pressure causes heart disease and increases the risk of stroke. This is more likely to develop in people who have high blood pressure readings or are overweight. Have your blood pressure checked: Every 3-5 years if you are 61-22 years of age. Every year if you are 25 years old or older. Diabetes Have regular diabetes screenings. This checks your fasting blood sugar level. Have the screening done: Once every three years after age 24 if you are at a normal weight and have a low risk for diabetes. More often and at a younger age if you are overweight or have a high risk for diabetes. What should I know about preventing infection? Hepatitis B If you have a higher risk for hepatitis B, you should be screened for this virus. Talk with your health care provider to find out if you are at risk for hepatitis B infection. Hepatitis C Testing is recommended for: Everyone born from [birth year] through 1966. Anyone with known risk factors for hepatitis C. Sexually transmitted infections (STIs) Get screened for STIs, including gonorrhea and chlamydia, if: You are sexually active and are younger than 68 years of age. You are older than 68 years of age and your health care provider tells you that you are at risk for this type of infection. Your sexual activity has changed since you were last screened, and you are at increased risk for chlamydia or gonorrhea. Ask your health care provider if you are at risk. Ask your health  care provider about whether you are at high risk for HIV. Your health care provider may recommend a prescription medicine to help prevent HIV infection. If you choose to take medicine to prevent HIV, you should first get tested for HIV. You should then be tested every 3 months for as long as you are taking the medicine. Pregnancy If you are about to stop having your period (premenopausal) and you may become pregnant, seek counseling before you get pregnant. Take 400 to 800 micrograms (mcg) of folic acid every day if you become pregnant. Ask for birth control (contraception) if you want to prevent pregnancy. Osteoporosis and menopause Osteoporosis is a disease in which the bones lose minerals and strength with aging. This can result in bone fractures. If you are 37 years old or older, or if you are at risk for osteoporosis and fractures, ask your health care provider if you should: Be screened for bone loss. Take a calcium or vitamin D supplement to lower your risk of fractures. Be given  hormone replacement therapy (HRT) to treat symptoms of menopause. Follow these instructions at home: Alcohol use Do not drink alcohol if: Your health care provider tells you not to drink. You are pregnant, may be pregnant, or are planning to become pregnant. If you drink alcohol: Limit how much you have to: 0-1 drink a day. Know how much alcohol is in your drink. In the [place] one drink equals one 12 oz bottle of beer (355 mL), one 5 oz glass of wine (148 mL), or one 1 oz glass of hard liquor (44 mL). Lifestyle Do not use any products that contain nicotine or tobacco. These products include cigarettes, chewing tobacco, and vaping devices, such as e-cigarettes. If you need help quitting, ask your health care provider. Do not use street drugs. Do not share needles. Ask your health care provider for help if you need support or information about quitting drugs. General instructions Schedule regular health,  dental, and eye exams. Stay current with your vaccines. Tell your health care provider if: You often feel depressed. You have ever been abused or do not feel safe at home. Summary Adopting a healthy lifestyle and getting preventive care are important in promoting health and wellness. Follow your health care provider's instructions about healthy diet, exercising, and getting tested or screened for diseases. Follow your health care provider's instructions on monitoring your cholesterol and blood pressure. This information is not intended to replace advice given to you by your health care provider. Make sure you discuss any questions you have with your health care provider. Document Revised: 11/08/2020 Document Reviewed: 11/08/2020 Elsevier Patient Education  2024 [employer].

## 2023-03-13 NOTE — Progress Notes (Signed)
MEDICARE ANNUAL WELLNESS VISIT  03/13/2023  Telephone Visit Disclaimer This Medicare AWV was conducted by telephone due to national recommendations for restrictions regarding the COVID-19 Pandemic (e.g. social distancing).  I verified, using two identifiers, that I am speaking with Lindsay Moore or their authorized healthcare agent. I discussed the limitations, risks, security, and privacy concerns of performing an evaluation and management service by telephone and the potential availability of an in-person appointment in the future. The patient expressed understanding and agreed to proceed.  Location of Patient: Home Location of Provider (nurse):  In the office.  Subjective:    Lindsay Moore is a 67 y.o. female patient of Christen Butter, NP who had a Medicare Annual Wellness Visit today via telephone. Lindsay Moore is Retired and lives with husband and her oldest son. she has 5 children. she reports that she is socially active and does interact with friends/family regularly. she is moderately physically active and enjoys making, sewing and doing crafts.  Patient Care Team: Christen Butter, NP as PCP - General (Nurse Practitioner) Laren Boom, DO (Family Medicine) Gabriel Carina, [facility] as Pharmacist (Pharmacist)     03/13/2023    8:49 AM 03/03/2022   11:20 AM 12/14/2020    9:41 AM 03/13/2020   12:20 PM 11/10/2015   10:59 AM 06/25/2015    2:40 PM  Advanced Directives  Does Patient Have a Medical Advance Directive? No No No No No No  Would patient like information on creating a medical advance directive? No - Patient declined No - Patient declined No - Patient declined  No - patient declined information No - patient declined information    Hospital Utilization Over the Past 12 Months: # of hospitalizations or ER visits: 0 # of surgeries: 0  Review of Systems    Patient reports that her overall health is better compared to last year.  History obtained from chart review and  the patient  Patient Reported Readings (BP, Pulse, CBG, Weight, etc) none Per patient no change in vitals since last visit, unable to obtain new vitals due to telehealth visit  Pain Assessment Pain : No/denies pain     Current Medications & Allergies (verified) Allergies as of 03/13/2023       Reactions   Ibuprofen Shortness Of Breath   Amlodipine Swelling   Bee Venom Other (See Comments)   Lisinopril Swelling        Medication List        Accurate as of March 13, 2023  9:04 AM. If you have any questions, ask your nurse or doctor.          acetaminophen 650 MG CR tablet Commonly known as: TYLENOL Take 1 tablet (650 mg total) by mouth every 8 (eight) hours as needed for pain.   albuterol 108 (90 Base) MCG/ACT inhaler Commonly known as: VENTOLIN HFA Inhale 1-2 puffs into the lungs every 6 (six) hours as needed for wheezing or shortness of breath.   aspirin EC 81 MG tablet Take 1 tablet (81 mg total) by mouth daily.   atorvastatin 10 MG tablet Commonly known as: LIPITOR Take 1 tablet by mouth once daily   CALCIUM + D3 PO Take by mouth.   carvedilol 12.5 MG tablet Commonly known as: COREG TAKE 1 TABLET BY MOUTH TWICE DAILY WITH A MEAL   estradiol 0.1 MG/GM vaginal cream Commonly known as: ESTRACE VAGINAL Use a pea sized amount to apply topically to the vaginal area once daily for 14 days then  reduce to twice weekly.   hydrochlorothiazide 25 MG tablet Commonly known as: HYDRODIURIL Take 1 tablet by mouth once daily   hydrOXYzine 10 MG tablet Commonly known as: ATARAX Take 1 tablet (10 mg total) by mouth 3 (three) times daily as needed.   MEGARED OMEGA-3 KRILL OIL PO Take by mouth.   omeprazole 20 MG capsule Commonly known as: PRILOSEC Take 1 capsule by mouth once daily   potassium chloride 10 MEQ tablet Commonly known as: KLOR-CON Take 1 tablet by mouth once daily   Restasis 0.05 % ophthalmic emulsion Generic drug: cycloSPORINE 1 drop 2  (two) times daily.   triamcinolone ointment 0.1 % Commonly known as: KENALOG Apply 1 application topically 2 (two) times daily. 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    Hyperlipidemia 06/10/2013   Lumbar strain 01/06/2014   Mild mitral valve regurgitation 05/07/2020   Ocular migraine 06/10/2013   Palpitations 03/17/2020   Postmenopausal bleeding 08/17/2014   Primary osteoarthritis of both knees 08/31/2016   Right knee pain 06/10/2013   Workers Comp: [facility] Dr. Luiz Blare    Septal defect 06/10/2013   Serous otitis media 01/06/2015   Situational anxiety 06/10/2013   TIA (transient ischemic attack) 06/10/2013   Past Surgical History:  Procedure Laterality Date   bicep surgery Right    BREAST BIOPSY Left 08/28/2022   MM LT BREAST BX W LOC DEV EA AD LESION IMG BX SPEC STEREO GUIDE 08/28/2022 [facility]   BREAST BIOPSY Left 08/28/2022   MM LT BREAST BX W LOC DEV 1ST LESION IMAGE BX SPEC STEREO GUIDE 08/28/2022 [facility]   broken hip  2000   FOOT TENDON SURGERY  2000   GALLBLADDER SURGERY     orthoscopic knee surgery  10/2012   TONSILLECTOMY  1959   TUBAL LIGATION  1988   Family History  Problem Relation Age of  Onset   Alcoholism Father    Lung cancer Father    Diabetes Other        grandmother   Hyperlipidemia Mother    Hypertension Mother    Stroke Other        grandmother   Social History   Socioeconomic History   Marital status: Married    Spouse name: Lindsay Moore   Number of children: 5   Years of education: 14   Highest education level: Some college, no degree  Occupational History   Occupation: Runner, broadcasting/film/video   Occupation: Retired.  Tobacco Use   Smoking status: Former    Current packs/day: 0.00    Average packs/day: 1 pack/day for 25.0 years (25.0 ttl pk-yrs)    Types: Cigarettes    Start date: 07/03/1972  Quit date: 07/03/1997    Years since quitting: 25.7   Smokeless tobacco: Never  Vaping Use   Vaping status: Never Used  Substance and Sexual Activity   Alcohol use: Not Currently   Drug use: No   Sexual activity: Not on file  Other Topics Concern   Not on file  Social History Narrative   Live with her husband and oldest son. She enjoys making soap, sewing and doing crafts.   Social Determinants of Health   Financial Resource Strain: Low Risk  (03/13/2023)   Overall Financial Resource Strain (CARDIA)    Difficulty of Paying Living Expenses: Not hard at all  Food Insecurity: No Food Insecurity (03/13/2023)   Hunger Vital Sign    Worried About Running Out of Food in the Last Year: Never true    Ran Out of Food in the Last Year: Never true  Transportation Needs: No Transportation Needs (03/13/2023)   PRAPARE - Administrator, Civil Service (Medical): No    Lack of Transportation (Non-Medical): No  Physical Activity: Sufficiently Active (03/13/2023)   Exercise Vital Sign    Days of Exercise per Week: 3 days    Minutes of Exercise per Session: 120 min  Stress: No Stress Concern Present (03/13/2023)   [employer] of Occupational Health - Occupational Stress Questionnaire    Feeling of Stress : Not at all  Social Connections: Socially Integrated (03/13/2023)    Social Connection and Isolation Panel [NHANES]    Frequency of Communication with Friends and Family: More than three times a week    Frequency of Social Gatherings with Friends and Family: More than three times a week    Attends Religious Services: More than 4 times per year    Active Member of [employer] or Organizations: Yes    Attends Banker Meetings: More than 4 times per year    Marital Status: Married    Activities of Daily Living    03/13/2023    8:54 AM  In your present state of health, do you have any difficulty performing the following activities:  Hearing? 0  Vision? 0  Difficulty concentrating or making decisions? 0  Walking or climbing stairs? 1  Comment stairs are difficult due to knees  Dressing or bathing? 0  Doing errands, shopping? 0  Preparing Food and eating ? N  Using the Toilet? N  In the past six months, have you accidently leaked urine? N  Do you have problems with loss of bowel control? N  Managing your Medications? N  Managing your Finances? N  Housekeeping or managing your Housekeeping? N    Patient Education/ Literacy How often do you need to have someone help you when you read instructions, pamphlets, or other written materials from your doctor or pharmacy?: 1 - Never What is the last grade level you completed in school?: two years of college  Exercise    Diet Patient reports consuming 2-3 meals a day and 2-3 snack(s) a day Patient reports that her primary diet is: Regular Patient reports that she does have regular access to food.   Depression Screen    03/13/2023    8:49 AM 11/30/2022    9:45 AM 03/03/2022   11:22 AM 05/23/2021    8:25 AM 05/23/2021    8:22 AM 12/14/2020    9:45 AM 11/24/2020   11:32 AM  PHQ 2/9 Scores  PHQ - 2 Score 0 0 0 1 1 0 0  PHQ- 9 Score  1   0     Fall Risk    03/13/2023    8:49 AM 11/30/2022    9:45 AM 03/03/2022   11:22 AM 05/23/2021    8:21 AM 12/14/2020    9:42 AM  Fall Risk   Falls in the  past year? 0 0 0 1 1  Number falls in past yr: 0 0 0 1 1  Injury with Fall? 0 0 0 0 1  Risk for fall due to : No Fall Risks No Fall Risks No Fall Risks  Impaired mobility  Follow up Falls evaluation completed Falls evaluation completed Falls evaluation completed Falls evaluation completed Falls evaluation completed;Education provided     Objective:  Kayslee Fremin seemed alert and oriented and she participated appropriately during our telephone visit.  Blood Pressure Weight BMI  BP Readings from Last 3 Encounters:  11/30/22 (!) 169/92  11/25/21 (!) 160/86  06/06/21 (!) 156/87   Wt Readings from Last 3 Encounters:  11/30/22 154 lb 8 oz (70.1 kg)  11/25/21 158 lb 3.2 oz (71.8 kg)  05/23/21 164 lb 9.6 oz (74.7 kg)   BMI Readings from Last 1 Encounters:  11/30/22 26.11 kg/m    *Unable to obtain current vital signs, weight, and BMI due to telephone visit type  Hearing/Vision  Patryce did not seem to have difficulty with hearing/understanding during the telephone conversation Reports that she has had a formal eye exam by an eye care professional within the past year Reports that she has not had a formal hearing evaluation within the past year *Unable to fully assess hearing and vision during telephone visit type  Cognitive Function:    03/13/2023    8:56 AM 03/03/2022   11:28 AM 12/14/2020    9:51 AM  6CIT Screen  What Year? 0 points 0 points 0 points  What month? 0 points 0 points 0 points  What time? 0 points 0 points 0 points  Count back from 20 0 points 0 points 0 points  Months in reverse 0 points 0 points 0 points  Repeat phrase 0 points 0 points 0 points  Total Score 0 points 0 points 0 points   (Normal:0-7, Significant for Dysfunction: >8)  Normal Cognitive Function Screening: Yes   Immunization & Health Maintenance Record Immunization History  Administered Date(s) Administered   DTaP 01/31/2009   Influenza Split 05/17/2011, 05/29/2012   Influenza Whole  04/02/2013   Influenza,inj,Quad PF,6+ Mos 03/18/2014, 05/13/2015, 05/10/2016, 04/12/2017, 04/12/2018, 05/26/2019, 05/26/2020, 06/06/2021   Pneumococcal Conjugate-13 11/30/2022   Tdap 10/10/2016   Zoster Recombinant(Shingrix) 10/17/2016, 02/12/2017    Health Maintenance  Topic Date Due   Diabetic kidney evaluation - Urine ACR  03/14/2023 (Originally 09/29/1973)   INFLUENZA VACCINE  10/01/2023 (Originally 02/01/2023)   DEXA SCAN  03/12/2024 (Originally 12/04/2022)   Diabetic kidney evaluation - eGFR measurement  11/30/2023   Pneumonia Vaccine 52+ Years old (2 of 2 - PPSV23 or PCV20) 11/30/2023   Medicare Annual Wellness (AWV)  03/12/2024   MAMMOGRAM  08/22/2024   DTaP/Tdap/Td (3 - Td or Tdap) 10/11/2026   Colonoscopy  05/08/2027   Hepatitis C Screening  Completed   Zoster Vaccines- Shingrix  Completed   HPV VACCINES  Aged Out   COVID-19 Vaccine  Discontinued       Assessment  This is a routine wellness examination for [employer].  Health Maintenance: Due or Overdue There are no preventive care reminders to display for this patient.   Bralynn Eugene does  not need a referral for Community Assistance: Care Management:   no Social Work:    no Prescription Assistance:  no Nutrition/Diabetes Education:  no   Plan:  Personalized Goals  Goals Addressed               This Visit's Progress     Patient Stated (pt-stated)        Patient stated that she would like to walk more and be more active.       Personalized Health Maintenance & Screening Recommendations  Bone densitometry screening Urine ACR Influenza vaccine - patient declined  Lung Cancer Screening Recommended: no (Low Dose CT Chest recommended if Age 73-80 years, 20 pack-year currently smoking OR have quit w/in past 15 years) Hepatitis C Screening recommended: no HIV Screening recommended: no  Advanced Directives: Written information was not prepared per patient's request.  Referrals &  Orders Orders Placed This Encounter  Procedures   DEXAScan    Follow-up Plan Follow-up with Christen Butter, NP as planned Urine ACR can be completed at the next office visit.  Medicare wellness visit in one year.  Patient will access AVS on my chart.   I have personally reviewed and noted the following in the patient's chart:   Medical and social history Use of alcohol, tobacco or illicit drugs  Current medications and supplements Functional ability and status Nutritional status Physical activity Advanced directives List of other physicians Hospitalizations, surgeries, and ER visits in previous 12 months Vitals Screenings to include cognitive, depression, and falls Referrals and appointments  In addition, I have reviewed and discussed with Lindsay Moore certain preventive protocols, quality metrics, and best practice recommendations. A written personalized care plan for preventive services as well as general preventive health recommendations is available and can be mailed to the patient at her request.      Modesto Charon, RN BSN  03/13/2023

## 2023-03-23 ENCOUNTER — Ambulatory Visit: Payer: Medicare PPO | Admitting: Physician Assistant

## 2023-03-23 ENCOUNTER — Encounter: Payer: Self-pay | Admitting: Physician Assistant

## 2023-03-23 ENCOUNTER — Ambulatory Visit: Payer: Medicare PPO

## 2023-03-23 VITALS — BP 160/83 | HR 86 | Ht 64.5 in | Wt 154.5 lb

## 2023-03-23 DIAGNOSIS — M79671 Pain in right foot: Secondary | ICD-10-CM | POA: Diagnosis not present

## 2023-03-23 DIAGNOSIS — M722 Plantar fascial fibromatosis: Secondary | ICD-10-CM | POA: Diagnosis not present

## 2023-03-23 DIAGNOSIS — M19071 Primary osteoarthritis, right ankle and foot: Secondary | ICD-10-CM

## 2023-03-23 DIAGNOSIS — M7731 Calcaneal spur, right foot: Secondary | ICD-10-CM

## 2023-03-23 DIAGNOSIS — I10 Essential (primary) hypertension: Secondary | ICD-10-CM

## 2023-03-23 DIAGNOSIS — M7660 Achilles tendinitis, unspecified leg: Secondary | ICD-10-CM | POA: Insufficient documentation

## 2023-03-23 DIAGNOSIS — M7661 Achilles tendinitis, right leg: Secondary | ICD-10-CM

## 2023-03-23 MED ORDER — PREDNISONE 50 MG PO TABS: ORAL_TABLET | ORAL | 0 refills | Status: DC

## 2023-03-23 MED ORDER — DICLOFENAC SODIUM 1 % EX GEL: 4.0000 g | Freq: Four times a day (QID) | CUTANEOUS | 0 refills | Status: DC

## 2023-03-23 NOTE — Patient Instructions (Addendum)
Use voltaren gel up to 4 times a day Ice 3-4 times a day with exercises Consider burst of prednisone to help with inflammation Consider plantar night splint Tylenol for pain Wear boot and rest as much as you can Get xray down stairs  Plantar Fasciitis  Plantar fasciitis is a painful foot condition that affects the heel. It occurs when the band of tissue that connects the toes to the heel bone (plantar fascia) becomes irritated. This can happen as the result of exercising too much or doing other repetitive activities (overuse injury). Plantar fasciitis can cause mild irritation to severe pain that makes it difficult to walk or move. The pain is usually worse in the morning after sleeping, or after sitting or lying down for a period of time. Pain may also be worse after long periods of walking or standing. What are the causes? This condition may be caused by: Standing for long periods of time. Wearing shoes that do not have good arch support. Doing activities that put stress on joints (high-impact activities). This includes ballet and exercise that makes your heart beat faster (aerobic exercise), such as running. Being overweight. An abnormal way of walking (gait). Tight muscles in the back of your lower leg (calf). High arches in your feet or flat feet. Starting a new athletic activity. What are the signs or symptoms? The main symptom of this condition is heel pain. Pain may get worse after the following: Taking the first steps after a time of rest, especially in the morning after awakening, or after you have been sitting or lying down for a while. Long periods of standing still. Pain may decrease after 30-45 minutes of activity, such as gentle walking. How is this diagnosed? This condition may be diagnosed based on your medical history, a physical exam, and your symptoms. Your health care provider will check for: A tender area on the bottom of your foot. A high arch in your foot or flat  feet. Pain when you move your foot. Difficulty moving your foot. You may have imaging tests to confirm the diagnosis, such as: X-rays. Ultrasound. MRI. How is this treated? Treatment for plantar fasciitis depends on how severe your condition is. Treatment may include: Rest, ice, pressure (compression), and raising (elevating) the affected foot. This is called RICE therapy. Your health care provider may recommend RICE therapy along with over-the-counter pain medicines to manage your pain. Exercises to stretch your calves and your plantar fascia. A splint that holds your foot in a stretched, upward position while you sleep (night splint). Physical therapy to relieve symptoms and prevent problems in the future. Injections of steroid medicine (cortisone) to relieve pain and inflammation. Stimulating your plantar fascia with electrical impulses (extracorporeal shock wave therapy). This is usually the last treatment option before surgery. Surgery, if other treatments have not worked after 12 months. Follow these instructions at home: Managing pain, stiffness, and swelling  If directed, put ice on the painful area. To do this: Put ice in a plastic bag, or use a frozen bottle of water. Place a towel between your skin and the bag or bottle. Roll the bottom of your foot over the bag or bottle. Do this for 20 minutes, 2-3 times a day. Wear athletic shoes that have air-sole or gel-sole cushions, or try soft shoe inserts that are designed for plantar fasciitis. Elevate your foot above the level of your heart while you are sitting or lying down. Activity Avoid activities that cause pain. Ask your health care  provider what activities are safe for you. Do physical therapy exercises and stretches as told by your health care provider. Try activities and forms of exercise that are easier on your joints (low impact). Examples include swimming, water aerobics, and biking. General instructions Take  over-the-counter and prescription medicines only as told by your health care provider. Wear a night splint while sleeping, if told by your health care provider. Loosen the splint if your toes tingle, become numb, or turn cold and blue. Maintain a healthy weight, or work with your health care provider to lose weight as needed. Keep all follow-up visits. This is important. Contact a health care provider if you have: Symptoms that do not go away with home treatment. Pain that gets worse. Pain that affects your ability to move or do daily activities. Summary Plantar fasciitis is a painful foot condition that affects the heel. It occurs when the band of tissue that connects the toes to the heel bone (plantar fascia) becomes irritated. Heel pain is the main symptom of this condition. It may get worse after exercising too much or standing still for a long time. Treatment varies, but it usually starts with rest, ice, pressure (compression), and raising (elevating) the affected foot. This is called RICE therapy. Over-the-counter medicines can also be used to manage pain. This information is not intended to replace advice given to you by your health care provider. Make sure you discuss any questions you have with your health care provider. Document Revised: 10/06/2019 Document Reviewed: 10/06/2019 Elsevier Patient Education  2024 Else[employer] Inc.   Achilles Tendinitis Rehab Ask your health care provider which exercises are safe for you. Do exercises exactly as told by your provider and adjust them as directed. It is normal to feel mild stretching, pulling, tightness, or discomfort as you do these exercises. Stop right away if you feel sudden pain or your pain gets worse. Do not begin these exercises until told by your provider. Stretching and range-of-motion exercises These exercises warm up your muscles and joints. They improve the movement and flexibility of your ankle. These exercises also help to relieve  pain. Standing wall calf stretch with straight knee  Stand with your hands against a wall. Extend your left / right leg behind you, and bend your front knee slightly. Keep both of your heels on the floor. Point the toes of your back foot slightly inward. Keeping your heels on the floor and your back knee straight, shift your weight toward the wall. Do not let your back arch. You should feel a gentle stretch in your upper calf. Hold this position for __________ seconds. Repeat __________ times. Complete this exercise __________ times a day. Standing wall calf stretch with bent knee  Stand with your hands against a wall. Extend your left / right leg behind you, and bend your front knee slightly. Keep both of your heels on the floor. Point the toes of your back foot slightly inward. Keeping your heels on the floor, bend your back knee slightly. You should feel a gentle stretch deep in your lower calf near your heel. Hold this position for __________ seconds. Repeat __________ times. Complete this exercise __________ times a day. Strengthening exercises These exercises build strength and control of your ankle. Endurance is the ability to use your muscles for a long time, even after they get tired. Plantar flexion with band In this exercise, you push your toes downward, away from you, with an exercise band providing resistance. Sit on the floor with  your left / right leg extended. You may put a pillow under your calf to give your foot more room to move. Loop a rubber exercise band or tube around the ball of your left / right foot. The ball of your foot is on the walking surface, right under your toes. The band or tube should be slightly tense when your foot is relaxed. If the band or tube slips, you can put on your shoe or put a washcloth between the band and your foot to help it stay in place. Slowly point your toes downward, pushing them away from you (plantar flexion). Hold this position for  __________ seconds. Slowly release the tension in the band or tube, controlling smoothly until your foot is back to the starting position. Repeat steps 1-5 with your left / right leg. Repeat __________ times. Complete this exercise __________ times a day. Eccentric heel drop  In this exercise, you stand and slowly raise your heel and then slowly lower it. This exercise lengthens the calf muscles (eccentric) while the foot bears weight. If this exercise is too easy, try doing it while wearing a backpack with weights in it. Stand on a step with the balls of your feet. The ball of your foot is on the walking surface, right under your toes. Do not put your heels on the step. For balance, rest your hands on the wall or on a railing. Rise up onto the balls of your feet. Keeping your heels up, shift all of your weight to your left / right leg and pick up your other leg. Slowly lower your left / right leg so your heel drops below the level of the step. Put down your other foot before going back to the start position. If told by your provider, build up to: 3 sets of 15 repetitions while keeping your knees straight. 3 sets of 15 repetitions while keeping your knees slightly bent as far as told by your provider. Repeat __________ times. Complete this exercise __________ times a day. Balance exercises These exercises improve or maintain your balance. Balance helps to prevent falls. Single leg stand If this exercise is too easy, you can try it with your eyes closed or while standing on a pillow. Without shoes, stand near a railing or in a doorframe. Hold on to the railing or doorframe as needed. Stand on your left / right foot. Keep your big toe down on the floor and try to keep your arch lifted. You should feel a stretch across the bottom of your foot and arch. Do not let your foot roll inward. Hold this position for __________ seconds. Repeat __________ times. Complete this exercise __________ times a  day. This information is not intended to replace advice given to you by your health care provider. Make sure you discuss any questions you have with your health care provider. Document Revised: 03/28/2022 Document Reviewed: 03/28/2022 Elsevier Patient Education  2024 Elsevier Inc. Plantar Fasciitis Rehab Ask your health care provider which exercises are safe for you. Do exercises exactly as told by your health care provider and adjust them as directed. It is normal to feel mild stretching, pulling, tightness, or discomfort as you do these exercises. Stop right away if you feel sudden pain or your pain gets worse. Do not begin these exercises until told by your health care provider. Stretching and range-of-motion exercises These exercises warm up your muscles and joints and improve the movement and flexibility of your foot. These exercises also help to  relieve pain. Plantar fascia stretch  Sit with your left / right leg crossed over your opposite knee. Hold your heel with one hand with that thumb near your arch. With your other hand, hold your toes and gently pull them back toward the top of your foot. You should feel a stretch on the base (bottom) of your toes, or the bottom of your foot (plantar fascia), or both. Hold this stretch for__________ seconds. Slowly release your toes and return to the starting position. Repeat __________ times. Complete this exercise __________ times a day. Gastrocnemius stretch, standing This exercise is also called a calf (gastroc) stretch. It stretches the muscles in the back of the upper calf. Stand with your hands against a wall. Extend your left / right leg behind you, and bend your front knee slightly. Keeping your heels on the floor, your toes facing forward, and your back knee straight, shift your weight toward the wall. Do not arch your back. You should feel a gentle stretch in your upper calf. Hold this position for __________ seconds. Repeat __________  times. Complete this exercise __________ times a day. Soleus stretch, standing This exercise is also called a calf (soleus) stretch. It stretches the muscles in the back of the lower calf. Stand with your hands against a wall. Extend your left / right leg behind you, and bend your front knee slightly. Keeping your heels on the floor and your toes facing forward, bend your back knee and shift your weight slightly over your back leg. You should feel a gentle stretch deep in your lower calf. Hold this position for __________ seconds. Repeat __________ times. Complete this exercise __________ times a day. Gastroc and soleus stretch, standing step This exercise stretches the muscles in the back of the lower leg. These muscles are in the upper calf (gastrocnemius) and the lower calf (soleus). Stand with the ball of your left / right foot on the front of a step. The ball of your foot is on the walking surface, right under your toes. Keep your other foot firmly on the same step. Hold on to the wall or a railing for balance. Slowly lift your other foot, allowing your body weight to press your heel down over the edge of the front of the step. Keep knee straight and unbent. You should feel a stretch in your calf. Hold this position for __________ seconds. Return both feet to the step. Repeat this exercise with a slight bend in your left / right knee. Repeat __________ times with your left / right knee straight and __________ times with your left / right knee bent. Complete this exercise __________ times a day. Balance exercise This exercise builds your balance and strength control of your arch to help take pressure off your plantar fascia. Single leg stand If this exercise is too easy, you can try it with your eyes closed or while standing on a pillow. Without shoes, stand near a railing or in a doorway. You may hold on to the railing or door frame as needed. Stand on your left / right foot. Keep your  big toe down on the floor and lift the arch of your foot. You should feel a stretch across the bottom of your foot and your arch. Do not let your foot roll inward. Hold this position for __________ seconds. Repeat __________ times. Complete this exercise __________ times a day. This information is not intended to replace advice given to you by your health care provider. Make sure you  discuss any questions you have with your health care provider. Document Revised: 04/01/2020 Document Reviewed: 04/01/2020 Elsevier Patient Education  2024 [employer].

## 2023-03-23 NOTE — Progress Notes (Unsigned)
Acute Office Visit  Subjective:     Patient ID: Lindsay Moore, female    DOB: [birth year], 67 y.o.   MRN: [id number]  No chief complaint on file.   HPI Patient is in today for ***  .Marland Kitchen Active Ambulatory Problems    Diagnosis Date Noted   TIA (transient ischemic attack) 06/10/2013   Essential hypertension, benign 06/10/2013   Septal defect 06/10/2013   Ocular migraine 06/10/2013   Situational anxiety 06/10/2013   Hyperlipidemia 06/10/2013   Encounter for colonoscopy due to history of colonic polyp 06/19/2013   Breast mass 07/11/2013   Lumbar strain 01/06/2014   History of abnormal cervical Pap smear 06/19/2014   Postmenopausal bleeding 08/17/2014   Serous otitis media 01/06/2015   Biceps tendinitis on right 06/22/2015   Primary osteoarthritis of both knees 08/31/2016   Flexural eczema 11/23/2016   Closed displaced fracture of proximal phalanx of third toe of right foot 12/30/2019   Chronic obstructive pulmonary disease with acute exacerbation (HCC) 03/04/2020   AC (acromioclavicular) joint bone spurs 05/07/2020   Right knee pain 06/10/2013   Palpitations 03/17/2020   Heart defect    Mild mitral valve regurgitation 05/07/2020   Annual physical exam 11/25/2021   Resolved Ambulatory Problems    Diagnosis Date Noted   Right knee pain 06/10/2013   Health education 03/04/2020   High blood pressure 05/07/2020   Past Medical History:  Diagnosis Date   History of colonic polyps 06/19/2013     ROS      Objective:    There were no vitals taken for this visit. {Vitals History (Optional):23777}  Physical Exam  No results found for any visits on 03/23/23.      Assessment & Plan:   Placed in Cam boot  No follow-ups on file.  Tandy Gaw, PA-C

## 2023-03-26 ENCOUNTER — Encounter: Payer: Self-pay | Admitting: Physician Assistant

## 2023-03-26 DIAGNOSIS — M19071 Primary osteoarthritis, right ankle and foot: Secondary | ICD-10-CM | POA: Insufficient documentation

## 2023-03-26 DIAGNOSIS — M7731 Calcaneal spur, right foot: Secondary | ICD-10-CM | POA: Insufficient documentation

## 2023-03-26 DIAGNOSIS — M722 Plantar fascial fibromatosis: Secondary | ICD-10-CM | POA: Insufficient documentation

## 2023-03-26 NOTE — Progress Notes (Signed)
Thayer Ohm,   You do have a heel spur and midfoot arthritis that could be causing some pain and exacerbating plantar fasciitis. Treatment plan does not change for now.

## 2023-04-04 ENCOUNTER — Ambulatory Visit: Payer: Medicare PPO

## 2023-04-04 DIAGNOSIS — Z0000 Encounter for general adult medical examination without abnormal findings: Secondary | ICD-10-CM

## 2023-04-04 DIAGNOSIS — Z780 Asymptomatic menopausal state: Secondary | ICD-10-CM | POA: Diagnosis not present

## 2023-04-04 DIAGNOSIS — M8589 Other specified disorders of bone density and structure, multiple sites: Secondary | ICD-10-CM | POA: Diagnosis not present

## 2023-04-09 ENCOUNTER — Other Ambulatory Visit: Payer: Self-pay | Admitting: General Surgery

## 2023-04-09 ENCOUNTER — Other Ambulatory Visit: Payer: Self-pay | Admitting: Medical-Surgical

## 2023-04-09 ENCOUNTER — Ambulatory Visit
Admission: RE | Admit: 2023-04-09 | Discharge: 2023-04-09 | Disposition: A | Discharge status: Home / Self Care | Payer: Medicare PPO | Source: Ambulatory Visit | Attending: General Surgery | Admitting: General Surgery

## 2023-04-09 DIAGNOSIS — R928 Other abnormal and inconclusive findings on diagnostic imaging of breast: Secondary | ICD-10-CM

## 2023-04-09 DIAGNOSIS — R921 Mammographic calcification found on diagnostic imaging of breast: Secondary | ICD-10-CM

## 2023-04-11 ENCOUNTER — Encounter: Payer: Self-pay | Admitting: Sports Medicine

## 2023-04-11 ENCOUNTER — Ambulatory Visit (INDEPENDENT_AMBULATORY_CARE_PROVIDER_SITE_OTHER): Payer: Medicare PPO | Admitting: Sports Medicine

## 2023-04-11 DIAGNOSIS — M722 Plantar fascial fibromatosis: Secondary | ICD-10-CM | POA: Diagnosis not present

## 2023-04-11 NOTE — Progress Notes (Signed)
    Procedures performed today:    None.  Independent interpretation of notes and tests performed by another provider:   None.  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 On exam she has pes cavus with tenderness at the plantar fascial origin. We will switch her to more intrinsic foot conditioning, she will wear some three-quarter length inserts that she has at home, she can wear these in her shoe and in her boot. She will do the boot for 2 more weeks and then weight-bear as tolerated. In 4 weeks if not feeling better we will do an injection.    ____________________________________________ Ihor Austin. Benjamin Stain, M.D., ABFM., CAQSM., AME. Primary Care and Sports Medicine [facility]  Adjunct Professor of Family Medicine  [employer] [facility] [employer]  Restaurant manager, fast food

## 2023-04-11 NOTE — Assessment & Plan Note (Signed)
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 On exam she has pes cavus with tenderness at the plantar fascial origin. We will switch her to more intrinsic foot conditioning, she will wear some three-quarter length inserts that she has at home, she can wear these in her shoe and in her boot. She will do the boot for 2 more weeks and then weight-bear as tolerated. In 4 weeks if not feeling better we will do an injection.

## 2023-04-24 ENCOUNTER — Encounter: Payer: Self-pay | Admitting: Medical-Surgical

## 2023-05-03 ENCOUNTER — Other Ambulatory Visit: Payer: Self-pay | Admitting: Medical-Surgical

## 2023-05-04 ENCOUNTER — Other Ambulatory Visit: Payer: Self-pay | Admitting: Sports Medicine

## 2023-05-04 MED ORDER — TRIAZOLAM 0.25 MG PO TABS: ORAL_TABLET | ORAL | 0 refills | Status: DC

## 2023-05-09 ENCOUNTER — Ambulatory Visit (INDEPENDENT_AMBULATORY_CARE_PROVIDER_SITE_OTHER): Payer: Medicare PPO | Admitting: Sports Medicine

## 2023-05-09 ENCOUNTER — Encounter: Payer: Self-pay | Admitting: Sports Medicine

## 2023-05-09 DIAGNOSIS — M722 Plantar fascial fibromatosis: Secondary | ICD-10-CM

## 2023-05-09 NOTE — Assessment & Plan Note (Signed)
This pleasant 67 year old female has improved considerably with regards to her plantar fasciitis, she is avoiding barefoot walking, she is doing intrinsic foot conditioning, she is wearing some three-quarter length inserts. We added scaphoid pads to the inserts today, as she is doing so much better she can return to see me as needed.

## 2023-05-09 NOTE — Progress Notes (Signed)
    Procedures performed today:    None.  Independent interpretation of notes and tests performed by another provider:   None.  Brief History, Exam, Impression, and Recommendations:    Plantar fasciitis, right This pleasant 67 year old female has improved considerably with regards to her plantar fasciitis, she is avoiding barefoot walking, she is doing intrinsic foot conditioning, she is wearing some three-quarter length inserts. We added scaphoid pads to the inserts today, as she is doing so much better she can return to see me as needed.    ____________________________________________ Ihor Austin. Benjamin Stain, M.D., ABFM., CAQSM., AME. Primary Care and Sports Medicine [facility]  Adjunct Professor of Family Medicine  [employer]  Restaurant manager, fast food

## 2023-05-17 ENCOUNTER — Other Ambulatory Visit: Payer: Self-pay | Admitting: Medical-Surgical

## 2023-05-18 ENCOUNTER — Other Ambulatory Visit: Payer: Self-pay | Admitting: Medical-Surgical

## 2023-05-24 ENCOUNTER — Other Ambulatory Visit: Payer: Self-pay | Admitting: Medical-Surgical

## 2023-06-03 ENCOUNTER — Other Ambulatory Visit: Payer: Self-pay | Admitting: Medical-Surgical

## 2023-07-07 ENCOUNTER — Other Ambulatory Visit: Payer: Self-pay | Admitting: Medical-Surgical

## 2023-07-28 ENCOUNTER — Other Ambulatory Visit: Payer: Self-pay | Admitting: Medical-Surgical

## 2023-08-15 ENCOUNTER — Other Ambulatory Visit: Payer: Self-pay | Admitting: Medical-Surgical

## 2023-08-16 ENCOUNTER — Ambulatory Visit
Admission: RE | Admit: 2023-08-16 | Discharge: 2023-08-16 | Disposition: A | Discharge status: Home / Self Care | Payer: Medicare PPO | Source: Ambulatory Visit | Attending: General Surgery | Admitting: General Surgery

## 2023-08-16 DIAGNOSIS — N641 Fat necrosis of breast: Secondary | ICD-10-CM | POA: Diagnosis not present

## 2023-08-16 DIAGNOSIS — R921 Mammographic calcification found on diagnostic imaging of breast: Secondary | ICD-10-CM

## 2023-08-17 ENCOUNTER — Other Ambulatory Visit: Payer: Self-pay | Admitting: Medical-Surgical

## 2023-08-21 ENCOUNTER — Other Ambulatory Visit: Payer: Self-pay | Admitting: Medical-Surgical

## 2023-08-21 MED ORDER — POTASSIUM CHLORIDE ER 10 MEQ PO TBCR: 10.0000 meq | EXTENDED_RELEASE_TABLET | Freq: Every day | ORAL | 0 refills | Status: DC

## 2023-09-18 ENCOUNTER — Ambulatory Visit (INDEPENDENT_AMBULATORY_CARE_PROVIDER_SITE_OTHER): Admitting: Medical-Surgical

## 2023-09-18 VITALS — BP 144/83 | HR 79 | Temp 97.7°F | Resp 20 | Ht 64.5 in | Wt 157.1 lb

## 2023-09-18 DIAGNOSIS — J069 Acute upper respiratory infection, unspecified: Secondary | ICD-10-CM

## 2023-09-18 LAB — POC COVID19 BINAXNOW: SARS Coronavirus 2 Ag: NEGATIVE

## 2023-09-18 LAB — POCT INFLUENZA A/B
Influenza A, POC: NEGATIVE
Influenza B, POC: NEGATIVE

## 2023-09-18 LAB — POCT RAPID STREP A (OFFICE): Rapid Strep A Screen: NEGATIVE

## 2023-09-18 MED ORDER — OSELTAMIVIR PHOSPHATE 75 MG PO CAPS: 75.0000 mg | ORAL_CAPSULE | Freq: Two times a day (BID) | ORAL | 0 refills | Status: AC

## 2023-09-18 MED ORDER — PROMETHAZINE-DM 6.25-15 MG/5ML PO SYRP: 5.0000 mL | ORAL_SOLUTION | Freq: Four times a day (QID) | ORAL | 0 refills | Status: DC | PRN

## 2023-09-18 NOTE — Progress Notes (Unsigned)
 Subjective:  Patient ID: Lindsay Moore, female    DOB: 1955/10/29, 68 y.o.   MRN: [id number]  Patient Care Team: Christen Butter, NP as PCP - General (Nurse Practitioner) Laren Boom, DO (Family Medicine) Gabriel Carina, [facility] (Inactive) as Pharmacist (Pharmacist)   Chief Complaint:  Cough, Diarrhea, Sore Throat, and SNEEZING   HPI:  Lindsay Moore is a 68 y.o. female presenting on 09/18/2023 for Cough, Diarrhea, Sore Throat, and SNEEZING   History, Exam,  Impression and Plan  1. Viral URI with cough (Primary) Present for subjective fever, sore throat, cough, myalgia, and decreased appetite x 4 days. Patient's husband present with similar signs and symptoms and is positive for POCT influenza A.  They state this is the first time they have been out of the house in 4 days.  States felt like she has been having fevers but hasn't checked with thermometer.  Reports cough is wet sounding but nonproductive and sinus drainage as minimal and white.  Denies ear pain or sinus pressure.  Reports myalgia and achy back and shoulders.  On exam patient has congested sounding voice with nonproductive hoarse racking cough.  TMs are clear without erythemic injection, exterior ear canals are without erythema bilaterally.  Patient with temperature of 97.7 in clinic today.  Blood pressure 144/83 with heart rate of 79.  Patient has a history of whitecoat blood pressures.  And states her blood pressure her blood pressure runs 130-140s systolic at home.  Negative for POCT influenza A/B, rapid strep A, COVID-19.  Exam and presentation is consistent with viral etiology. Start Tamiflu and Promethazine DM as prescribed.  Continue acetaminophen 650 mg PO every 6 hours PRN for fevers and myalgia. Do not exceed 4g of acetaminophen in 24-hour period. - POCT Influenza A/B - POCT rapid strep A - POC COVID-19 -  Start oseltamivir (TAMIFLU) 75 MG capsule; Take 1 capsule (75 mg total) by mouth 2 (two) times  daily. -   Start promethazine-dextromethorphan (PROMETHAZINE-DM) 6.25-15 MG/5ML syrup; Take 5 mLs by mouth 4 (four) times daily as needed.   Continue all other maintenance medications.  Follow up plan: Return if symptoms worsen or fail to improve.    Relevant past medical, surgical, family, and social history reviewed and updated as indicated.  Allergies and medications reviewed and updated. Data reviewed: Chart in Epic.   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    Hyperlipidemia 06/10/2013   Lumbar strain 01/06/2014   Mild mitral valve regurgitation 05/07/2020   Ocular migraine 06/10/2013   Palpitations 03/17/2020   Postmenopausal bleeding 08/17/2014   Primary osteoarthritis of both knees 08/31/2016   Right knee pain 06/10/2013   Workers Comp: [facility] Dr. Luiz Blare    Septal defect 06/10/2013  Serous otitis media 01/06/2015   Situational anxiety 06/10/2013   TIA (transient ischemic attack) 06/10/2013    Past Surgical History:  Procedure Laterality Date   bicep surgery Right    BREAST BIOPSY Left 08/28/2022   MM LT BREAST BX W LOC DEV EA AD LESION IMG BX SPEC STEREO GUIDE 08/28/2022 [facility]   BREAST BIOPSY Left 08/28/2022   MM LT BREAST BX W LOC DEV 1ST LESION  IMAGE BX SPEC STEREO GUIDE 08/28/2022 [facility]   broken hip  07/03/1998   FOOT TENDON SURGERY  07/03/1998   GALLBLADDER SURGERY     orthoscopic knee surgery  10/31/2012   TONSILLECTOMY  07/03/1957   TUBAL LIGATION  07/03/1986    Social History   Socioeconomic History   Marital status: Married    Spouse name: Tinnie Gens   Number of children: 5   Years of education: 14   Highest education level: Associate degree: occupational, Scientist, product/process development, or vocational program  Occupational History   Occupation: Runner, broadcasting/film/video   Occupation: Retired.  Tobacco Use   Smoking status: Former    Current packs/day: 0.00    Average packs/day: 1 pack/day for 25.0 years (25.0 ttl pk-yrs)    Types: Cigarettes    Start date: 07/03/1972    Quit date: 07/03/1997    Years since quitting: 26.2   Smokeless tobacco: Never  Vaping Use   Vaping status: Never Used  Substance and Sexual Activity   Alcohol use: Not Currently   Drug use: No   Sexual activity: Not on file  Other Topics Concern   Not on file  Social History Narrative   Live with her husband and oldest son. She enjoys making soap, sewing and doing crafts.   Social Drivers of Health   Financial Resource Strain: Patient Declined (09/18/2023)   Overall Financial Resource Strain (CARDIA)    Difficulty of Paying Living Expenses: Patient declined  Food Insecurity: Patient Declined (09/18/2023)   Hunger Vital Sign    Worried About Running Out of Food in the Last Year: Patient declined    Ran Out of Food in the Last Year: Patient declined  Transportation Needs: No Transportation Needs (09/18/2023)   PRAPARE - Administrator, Civil Service (Medical): No    Lack of Transportation (Non-Medical): No  Physical Activity: Insufficiently Active (09/18/2023)   Exercise Vital Sign    Days of Exercise per Week: 3 days    Minutes of Exercise per Session: 30 min  Stress: No Stress Concern Present (09/18/2023)   [employer] of Occupational Health -  Occupational Stress Questionnaire    Feeling of Stress : Only a little  Social Connections: Socially Integrated (09/18/2023)   Social Connection and Isolation Panel [NHANES]    Frequency of Communication with Friends and Family: More than three times a week    Frequency of Social Gatherings with Friends and Family: Three times a week    Attends Religious Services: More than 4 times per year    Active Member of Clubs or Organizations: Yes    Attends Banker Meetings: More than 4 times per year    Marital Status: Married  Catering manager Violence: Not At Risk (03/13/2023)   Humiliation, Afraid, Rape, and Kick questionnaire    Fear of Current or Ex-Partner: No    Emotionally Abused: No    Physically Abused: No    Sexually Abused: No    Outpatient Encounter Medications as of 09/18/2023  Medication Sig   acetaminophen (TYLENOL) 650 MG  CR tablet Take 1 tablet (650 mg total) by mouth every 8 (eight) hours as needed for pain.   aspirin EC 81 MG tablet Take 1 tablet (81 mg total) by mouth daily.   atorvastatin (LIPITOR) 10 MG tablet Take 1 tablet by mouth once daily   Calcium Carb-Cholecalciferol (CALCIUM + D3 PO) Take by mouth.   carvedilol (COREG) 12.5 MG tablet TAKE 1 TABLET BY MOUTH TWICE DAILY WITH A MEAL   estradiol (ESTRACE VAGINAL) 0.1 MG/GM vaginal cream Use a pea sized amount to apply topically to the vaginal area once daily for 14 days then reduce to twice weekly.   hydrochlorothiazide (HYDRODIURIL) 25 MG tablet Take 1 tablet by mouth once daily   MEGARED OMEGA-3 KRILL OIL PO Take by mouth.   omeprazole (PRILOSEC) 20 MG capsule Take 1 capsule by mouth once daily   oseltamivir (TAMIFLU) 75 MG capsule Take 1 capsule (75 mg total) by mouth 2 (two) times daily.   potassium chloride (KLOR-CON) 10 MEQ tablet Take 1 tablet (10 mEq total) by mouth daily.   promethazine-dextromethorphan (PROMETHAZINE-DM) 6.25-15 MG/5ML syrup Take 5 mLs by mouth 4 (four) times daily as needed.    RESTASIS 0.05 % ophthalmic emulsion 1 drop 2 (two) times daily.   triamcinolone ointment (KENALOG) 0.1 % Apply 1 application topically 2 (two) times daily. To affected areas   [DISCONTINUED] diclofenac Sodium (VOLTAREN) 1 % GEL Apply 4 g topically 4 (four) times daily. To affected joint.   [DISCONTINUED] hydrOXYzine (ATARAX/VISTARIL) 10 MG tablet Take 1 tablet (10 mg total) by mouth 3 (three) times daily as needed. (Patient not taking: Reported on 09/18/2023)   [DISCONTINUED] predniSONE (DELTASONE) 50 MG tablet Take one tablet for 5 days.   [DISCONTINUED] triazolam (HALCION) 0.25 MG tablet 1-2 tabs PO 2 hours before procedure or imaging.  Do not drive with this medication.   No facility-administered encounter medications on file as of 09/18/2023.    Allergies  Allergen Reactions   Ibuprofen Shortness Of Breath   Amlodipine Swelling   Bee Venom Other (See Comments)   Lisinopril Swelling    Review of Systems      Objective:  BP (!) 144/83 (BP Location: Right Arm, Cuff Size: Normal)   Pulse 79   Temp 97.7 F (36.5 C) (Oral)   Resp 20   Ht 5' 4.5" (1.638 m)   Wt 157 lb 1.3 oz (71.3 kg)   SpO2 97%   BMI 26.55 kg/m    Wt Readings from Last 3 Encounters:  09/18/23 157 lb 1.3 oz (71.3 kg)  03/23/23 154 lb 8 oz (70.1 kg)  11/30/22 154 lb 8 oz (70.1 kg)    Physical Exam  Results for orders placed or performed in visit on 09/18/23  POC COVID-19   Collection Time: 09/18/23  4:11 PM  Result Value Ref Range   SARS Coronavirus 2 Ag Negative Negative  POCT Influenza A/B   Collection Time: 09/18/23  4:12 PM  Result Value Ref Range   Influenza A, POC Negative Negative   Influenza B, POC Negative Negative  POCT rapid strep A   Collection Time: 09/18/23  4:12 PM  Result Value Ref Range   Rapid Strep A Screen Negative Negative       Pertinent labs & imaging results that were available during my care of the patient were reviewed by me and considered in my medical decision  making.   Continue healthy lifestyle choices, including diet (rich in fruits, vegetables, and lean proteins, and low in  salt and simple carbohydrates) and exercise (at least 30 minutes of moderate physical activity daily).   The above assessment and management plan was discussed with the patient. The patient verbalized understanding of and has agreed to the management plan. Patient is aware to call the clinic if they develop any new symptoms or if symptoms persist or worsen. Patient is aware when to return to the clinic for a follow-up visit. Patient educated on when it is appropriate to go to the emergency department.   Maryelizabeth Kaufmann Student AGNP

## 2023-09-19 NOTE — Progress Notes (Signed)
 Medical screening examination/treatment was performed by qualified clinical staff member and as supervising provider I was immediately available for consultation/collaboration. I have reviewed documentation and agree with assessment and plan.  Thayer Ohm, DNP, APRN, FNP-BC [facility]

## 2023-10-06 ENCOUNTER — Other Ambulatory Visit: Payer: Self-pay | Admitting: Medical-Surgical

## 2023-10-29 ENCOUNTER — Other Ambulatory Visit: Payer: Self-pay | Admitting: Medical-Surgical

## 2023-11-14 ENCOUNTER — Other Ambulatory Visit: Payer: Self-pay | Admitting: Medical-Surgical

## 2023-11-15 ENCOUNTER — Other Ambulatory Visit: Payer: Self-pay | Admitting: Medical-Surgical

## 2023-11-15 DIAGNOSIS — H2513 Age-related nuclear cataract, bilateral: Secondary | ICD-10-CM | POA: Diagnosis not present

## 2023-11-15 DIAGNOSIS — H524 Presbyopia: Secondary | ICD-10-CM | POA: Diagnosis not present

## 2023-11-27 ENCOUNTER — Other Ambulatory Visit: Payer: Self-pay | Admitting: Medical-Surgical

## 2023-12-03 ENCOUNTER — Ambulatory Visit (INDEPENDENT_AMBULATORY_CARE_PROVIDER_SITE_OTHER): Payer: Medicare PPO | Admitting: Medical-Surgical

## 2023-12-03 ENCOUNTER — Encounter: Payer: Self-pay | Admitting: Medical-Surgical

## 2023-12-03 VITALS — BP 137/82 | HR 78 | Resp 20 | Ht 64.5 in | Wt 156.0 lb

## 2023-12-03 DIAGNOSIS — Z0000 Encounter for general adult medical examination without abnormal findings: Secondary | ICD-10-CM | POA: Diagnosis not present

## 2023-12-03 DIAGNOSIS — F418 Other specified anxiety disorders: Secondary | ICD-10-CM

## 2023-12-03 DIAGNOSIS — E782 Mixed hyperlipidemia: Secondary | ICD-10-CM | POA: Diagnosis not present

## 2023-12-03 DIAGNOSIS — I10 Essential (primary) hypertension: Secondary | ICD-10-CM

## 2023-12-03 NOTE — Progress Notes (Signed)
 Complete physical exam  Patient: Lindsay Moore   DOB: [birth year]   68 y.o. Female  MRN: [id number]  Subjective:     Chief Complaint  Patient presents with   Annual Exam    Lindsay Moore is a 68 y.o. female who presents today for a complete physical exam. She reports consuming a general diet. The patient does not participate in regular exercise at present. She generally feels well. She reports sleeping poorly. She does not have additional problems to discuss today.    Most recent fall risk assessment:    12/03/2023    9:34 AM  Fall Risk   Falls in the past year? 0  Number falls in past yr: 0  Injury with Fall? 0  Risk for fall due to : No Fall Risks  Follow up Falls evaluation completed     Most recent depression screenings:    12/03/2023    9:35 AM 03/13/2023    8:49 AM  PHQ 2/9 Scores  PHQ - 2 Score 0 0  PHQ- 9 Score 2     Vision:Within last year and Dental: No current dental problems and Receives regular dental care    Patient Care Team: Cherre Cornish, NP as PCP - General (Nurse Practitioner) Murleen Arms, DO (Family Medicine) Noelia Batman, [facility] (Inactive) as Pharmacist (Pharmacist)   Outpatient Medications Prior to Visit  Medication Sig   acetaminophen  (TYLENOL ) 650 MG CR tablet Take 1 tablet (650 mg total) by mouth every 8 (eight) hours as needed for pain.   aspirin EC 81 MG tablet Take 1 tablet (81 mg total) by mouth daily.   atorvastatin  (LIPITOR) 10 MG tablet Take 1 tablet by mouth once daily   Calcium  Carb-Cholecalciferol (CALCIUM  + D3 PO) Take by mouth.   carvedilol  (COREG ) 12.5 MG tablet TAKE 1 TABLET BY MOUTH TWICE DAILY WITH A MEAL   estradiol  (ESTRACE  VAGINAL) 0.1 MG/GM vaginal cream Use a pea sized amount to apply topically to the vaginal area once daily for 14 days then reduce to twice weekly.   hydrochlorothiazide  (HYDRODIURIL ) 25 MG tablet Take 1 tablet by mouth once daily   MEGARED OMEGA-3 KRILL OIL PO Take by mouth.    omeprazole  (PRILOSEC) 20 MG capsule Take 1 capsule by mouth once daily   oseltamivir  (TAMIFLU ) 75 MG capsule Take 1 capsule (75 mg total) by mouth 2 (two) times daily.   potassium chloride  (KLOR-CON ) 10 MEQ tablet Take 1 tablet by mouth once daily   promethazine -dextromethorphan (PROMETHAZINE -DM) 6.25-15 MG/5ML syrup Take 5 mLs by mouth 4 (four) times daily as needed.   RESTASIS 0.05 % ophthalmic emulsion 1 drop 2 (two) times daily.   triamcinolone  ointment (KENALOG ) 0.1 % Apply 1 application topically 2 (two) times daily. To affected areas   No facility-administered medications prior to visit.    Review of Systems  Constitutional:  Negative for chills, fever, malaise/fatigue and weight loss.  HENT:  Negative for congestion, ear pain, hearing loss, sinus pain and sore throat.   Eyes:  Negative for blurred vision, photophobia and pain.  Respiratory:  Negative for cough, shortness of breath and wheezing.   Cardiovascular:  Negative for chest pain, palpitations and leg swelling.  Gastrointestinal:  Negative for abdominal pain, constipation, diarrhea, heartburn, nausea and vomiting.  Genitourinary:  Negative for dysuria, frequency and urgency.  Musculoskeletal:  Negative for falls and neck pain.  Skin:  Negative for itching and rash.  Neurological:  Negative for dizziness, weakness and headaches.  Endo/Heme/Allergies:  Negative for polydipsia. Does not bruise/bleed easily.  Psychiatric/Behavioral:  Negative for depression, substance abuse and suicidal ideas. The patient is not nervous/anxious.           Objective:     BP 137/82 (BP Location: Left Arm, Cuff Size: Normal)   Pulse 78   Resp 20   Ht 5' 4.5" (1.638 m)   Wt 156 lb (70.8 kg)   SpO2 97%   BMI 26.36 kg/m    Physical Exam Vitals reviewed.  Constitutional:      General: She is not in acute distress.    Appearance: Normal appearance. She is not ill-appearing.  HENT:     Head: Normocephalic and atraumatic.     Right  Ear: Tympanic membrane, ear canal and external ear normal. There is no impacted cerumen.     Left Ear: Tympanic membrane, ear canal and external ear normal. There is no impacted cerumen.     Nose: Nose normal. No congestion or rhinorrhea.     Mouth/Throat:     Mouth: Mucous membranes are moist.     Pharynx: No oropharyngeal exudate or posterior oropharyngeal erythema.  Eyes:     General: No scleral icterus.       Right eye: No discharge.        Left eye: No discharge.     Extraocular Movements: Extraocular movements intact.     Conjunctiva/sclera: Conjunctivae normal.     Pupils: Pupils are equal, round, and reactive to light.  Neck:     Thyroid : No thyromegaly.     Vascular: No carotid bruit or JVD.     Trachea: Trachea normal.  Cardiovascular:     Rate and Rhythm: Normal rate and regular rhythm.     Pulses: Normal pulses.     Heart sounds: Normal heart sounds. No murmur heard.    No friction rub. No gallop.  Pulmonary:     Effort: Pulmonary effort is normal. No respiratory distress.     Breath sounds: Normal breath sounds. No wheezing.  Abdominal:     General: Bowel sounds are normal. There is no distension.     Palpations: Abdomen is soft.     Tenderness: There is no abdominal tenderness. There is no guarding.  Musculoskeletal:        General: Normal range of motion.     Cervical back: Normal range of motion and neck supple.  Lymphadenopathy:     Cervical: No cervical adenopathy.  Skin:    General: Skin is warm and dry.  Neurological:     Mental Status: She is alert and oriented to person, place, and time.     Cranial Nerves: No cranial nerve deficit.  Psychiatric:        Mood and Affect: Mood normal.        Behavior: Behavior normal.        Thought Content: Thought content normal.        Judgment: Judgment normal.      No results found for any visits on 12/03/23.     Assessment & Plan:    Routine Health Maintenance and Physical Exam  Immunization History   Administered Date(s) Administered   DTaP 01/31/2009   Influenza Split 05/17/2011, 05/29/2012   Influenza Whole 04/02/2013   Influenza,inj,Quad PF,6+ Mos 03/18/2014, 05/13/2015, 05/10/2016, 04/12/2017, 04/12/2018, 05/26/2019, 05/26/2020, 06/06/2021   Pneumococcal Conjugate-13 11/30/2022   Tdap 10/10/2016   Zoster Recombinant(Shingrix ) 10/17/2016, 02/12/2017    Health Maintenance  Topic Date Due   Pneumonia Vaccine 65+  Years old (2 of 2 - PPSV23) 12/02/2024 (Originally 01/25/2023)   INFLUENZA VACCINE  02/01/2024   Medicare Annual Wellness (AWV)  04/03/2024   MAMMOGRAM  08/15/2025   DEXA SCAN  04/03/2026   DTaP/Tdap/Td (3 - Td or Tdap) 10/11/2026   Colonoscopy  05/08/2027   Hepatitis C Screening  Completed   Zoster Vaccines- Shingrix   Completed   HPV VACCINES  Aged Out   Meningococcal B Vaccine  Aged Out   COVID-19 Vaccine  Discontinued    Discussed health benefits of physical activity, and encouraged her to engage in regular exercise appropriate for her age and condition.  1. Annual physical exam (Primary) Checking labs as below.  Up-to-date on preventative care.  Wellness information provided with AVS. - CBC with Differential/Platelet - CMP14+EGFR - Lipid panel  2. Mixed hyperlipidemia Checking labs.  Continue atorvastatin  as prescribed. - CMP14+EGFR - Lipid panel  3. Situational anxiety Reports this has been doing much better lately.  Continue conservative management.  4. Essential hypertension, benign Historically elevated blood pressure on arrival due to whitecoat hypertension and anxiety.  Blood pressure today looks good at 137/82.  Checking labs as below.  Continue carvedilol  12.5 mg twice daily and hydrochlorothiazide  25 mg daily. - CBC with Differential/Platelet - CMP14+EGFR - Lipid panel   Return in about 1 year (around 12/02/2024) for annual physical exam or sooner if needed.     Anniah Glick, NP

## 2023-12-03 NOTE — Patient Instructions (Signed)
 Preventive Care 43 Years and Older, Female Preventive care refers to lifestyle choices and visits with your health care provider that can promote health and wellness. Preventive care visits are also called wellness exams. What can I expect for my preventive care visit? Counseling Your health care provider may ask you questions about your: Medical history, including: Past medical problems. Family medical history. Pregnancy and menstrual history. History of falls. Current health, including: Memory and ability to understand (cognition). Emotional well-being. Home life and relationship well-being. Sexual activity and sexual health. Lifestyle, including: Alcohol, nicotine or tobacco, and drug use. Access to firearms. Diet, exercise, and sleep habits. Work and work Astronomer. Sunscreen use. Safety issues such as seatbelt and bike helmet use. Physical exam Your health care provider will check your: Height and weight. These may be used to calculate your BMI (body mass index). BMI is a measurement that tells if you are at a healthy weight. Waist circumference. This measures the distance around your waistline. This measurement also tells if you are at a healthy weight and may help predict your risk of certain diseases, such as type 2 diabetes and high blood pressure. Heart rate and blood pressure. Body temperature. Skin for abnormal spots. What immunizations do I need?  Vaccines are usually given at various ages, according to a schedule. Your health care provider will recommend vaccines for you based on your age, medical history, and lifestyle or other factors, such as travel or where you work. What tests do I need? Screening Your health care provider may recommend screening tests for certain conditions. This may include: Lipid and cholesterol levels. Hepatitis C test. Hepatitis B test. HIV (human immunodeficiency virus) test. STI (sexually transmitted infection) testing, if you are at  risk. Lung cancer screening. Colorectal cancer screening. Diabetes screening. This is done by checking your blood sugar (glucose) after you have not eaten for a while (fasting). Mammogram. Talk with your health care provider about how often you should have regular mammograms. BRCA-related cancer screening. This may be done if you have a family history of breast, ovarian, tubal, or peritoneal cancers. Bone density scan. This is done to screen for osteoporosis. Talk with your health care provider about your test results, treatment options, and if necessary, the need for more tests. Follow these instructions at home: Eating and drinking  Eat a diet that includes fresh fruits and vegetables, whole grains, lean protein, and low-fat dairy products. Limit your intake of foods with high amounts of sugar, saturated fats, and salt. Take vitamin and mineral supplements as recommended by your health care provider. Do not drink alcohol if your health care provider tells you not to drink. If you drink alcohol: Limit how much you have to 0-1 drink a day. Know how much alcohol is in your drink. In the [place] one drink equals one 12 oz bottle of beer (355 mL), one 5 oz glass of wine (148 mL), or one 1 oz glass of hard liquor (44 mL). Lifestyle Brush your teeth every morning and night with fluoride toothpaste. Floss one time each day. Exercise for at least 30 minutes 5 or more days each week. Do not use any products that contain nicotine or tobacco. These products include cigarettes, chewing tobacco, and vaping devices, such as e-cigarettes. If you need help quitting, ask your health care provider. Do not use drugs. If you are sexually active, practice safe sex. Use a condom or other form of protection in order to prevent STIs. Take aspirin only as told by  your health care provider. Make sure that you understand how much to take and what form to take. Work with your health care provider to find out whether it  is safe and beneficial for you to take aspirin daily. Ask your health care provider if you need to take a cholesterol-lowering medicine (statin). Find healthy ways to manage stress, such as: Meditation, yoga, or listening to music. Journaling. Talking to a trusted person. Spending time with friends and family. Minimize exposure to UV radiation to reduce your risk of skin cancer. Safety Always wear your seat belt while driving or riding in a vehicle. Do not drive: If you have been drinking alcohol. Do not ride with someone who has been drinking. When you are tired or distracted. While texting. If you have been using any mind-altering substances or drugs. Wear a helmet and other protective equipment during sports activities. If you have firearms in your house, make sure you follow all gun safety procedures. What's next? Visit your health care provider once a year for an annual wellness visit. Ask your health care provider how often you should have your eyes and teeth checked. Stay up to date on all vaccines. This information is not intended to replace advice given to you by your health care provider. Make sure you discuss any questions you have with your health care provider. Document Revised: 12/15/2020 Document Reviewed: 12/15/2020 Elsevier Patient Education  2024 [employer].

## 2023-12-04 ENCOUNTER — Ambulatory Visit: Payer: Self-pay | Admitting: Medical-Surgical

## 2023-12-04 LAB — CBC WITH DIFFERENTIAL/PLATELET
Basophils Absolute: 0.1 10*3/uL (ref 0.0–0.2)
Basos: 1 %
EOS (ABSOLUTE): 0.3 10*3/uL (ref 0.0–0.4)
Eos: 3 %
Hematocrit: 44.8 % (ref 34.0–46.6)
Hemoglobin: 14.8 g/dL (ref 11.1–15.9)
Immature Grans (Abs): 0 10*3/uL (ref 0.0–0.1)
Immature Granulocytes: 0 %
Lymphocytes Absolute: 2.1 10*3/uL (ref 0.7–3.1)
Lymphs: 22 %
MCH: 29.9 pg (ref 26.6–33.0)
MCHC: 33 g/dL (ref 31.5–35.7)
MCV: 91 fL (ref 79–97)
Monocytes Absolute: 0.6 10*3/uL (ref 0.1–0.9)
Monocytes: 7 %
Neutrophils Absolute: 6.2 10*3/uL (ref 1.4–7.0)
Neutrophils: 67 %
Platelets: 290 10*3/uL (ref 150–450)
RBC: 4.95 x10E6/uL (ref 3.77–5.28)
RDW: 12.2 % (ref 11.7–15.4)
WBC: 9.4 10*3/uL (ref 3.4–10.8)

## 2023-12-04 LAB — LIPID PANEL
Chol/HDL Ratio: 2.6 ratio (ref 0.0–4.4)
Cholesterol, Total: 183 mg/dL (ref 100–199)
HDL: 71 mg/dL (ref >39)
LDL Chol Calc (NIH): 94 mg/dL (ref 0–99)
Triglycerides: 100 mg/dL (ref 0–149)
VLDL Cholesterol Cal: 18 mg/dL (ref 5–40)

## 2023-12-04 LAB — CMP14+EGFR
ALT: 13 IU/L (ref 0–32)
AST: 22 IU/L (ref 0–40)
Albumin: 4.5 g/dL (ref 3.9–4.9)
Alkaline Phosphatase: 113 IU/L (ref 44–121)
BUN/Creatinine Ratio: 16 (ref 12–28)
BUN: 18 mg/dL (ref 8–27)
Bilirubin Total: 0.5 mg/dL (ref 0.0–1.2)
CO2: 25 mmol/L (ref 20–29)
Calcium: 9.9 mg/dL (ref 8.7–10.3)
Chloride: 94 mmol/L — ABNORMAL LOW (ref 96–106)
Creatinine, Ser: 1.1 mg/dL — ABNORMAL HIGH (ref 0.57–1.00)
Globulin, Total: 2.5 g/dL (ref 1.5–4.5)
Glucose: 102 mg/dL — ABNORMAL HIGH (ref 70–99)
Potassium: 3.9 mmol/L (ref 3.5–5.2)
Sodium: 136 mmol/L (ref 134–144)
Total Protein: 7 g/dL (ref 6.0–8.5)
eGFR: 54 mL/min/{1.73_m2} — ABNORMAL LOW (ref >59)

## 2023-12-12 ENCOUNTER — Other Ambulatory Visit: Payer: Self-pay | Admitting: Medical-Surgical

## 2023-12-12 DIAGNOSIS — I10 Essential (primary) hypertension: Secondary | ICD-10-CM

## 2024-01-04 ENCOUNTER — Other Ambulatory Visit: Payer: Self-pay | Admitting: Medical-Surgical

## 2024-01-25 ENCOUNTER — Other Ambulatory Visit: Payer: Self-pay | Admitting: Medical-Surgical

## 2024-02-09 ENCOUNTER — Other Ambulatory Visit: Payer: Self-pay | Admitting: Medical-Surgical

## 2024-02-23 ENCOUNTER — Other Ambulatory Visit: Payer: Self-pay | Admitting: Medical-Surgical

## 2024-03-04 ENCOUNTER — Encounter: Payer: Self-pay | Admitting: Sports Medicine

## 2024-03-20 ENCOUNTER — Encounter

## 2024-04-02 ENCOUNTER — Other Ambulatory Visit: Payer: Self-pay | Admitting: Medical-Surgical

## 2024-04-23 ENCOUNTER — Other Ambulatory Visit: Payer: Self-pay | Admitting: Medical-Surgical

## 2024-05-09 ENCOUNTER — Other Ambulatory Visit: Payer: Self-pay | Admitting: Medical-Surgical

## 2024-05-24 ENCOUNTER — Other Ambulatory Visit: Payer: Self-pay | Admitting: Medical-Surgical

## 2024-05-26 ENCOUNTER — Ambulatory Visit
Admission: EM | Admit: 2024-05-26 | Discharge: 2024-05-26 | Disposition: A | Discharge status: Home / Self Care | Attending: Family Medicine | Admitting: Family Medicine

## 2024-05-26 ENCOUNTER — Ambulatory Visit

## 2024-05-26 ENCOUNTER — Other Ambulatory Visit: Payer: Self-pay

## 2024-05-26 DIAGNOSIS — S52515A Nondisplaced fracture of left radial styloid process, initial encounter for closed fracture: Secondary | ICD-10-CM | POA: Diagnosis not present

## 2024-05-26 DIAGNOSIS — S6992XA Unspecified injury of left wrist, hand and finger(s), initial encounter: Secondary | ICD-10-CM

## 2024-05-26 DIAGNOSIS — W19XXXA Unspecified fall, initial encounter: Secondary | ICD-10-CM

## 2024-05-26 DIAGNOSIS — M25532 Pain in left wrist: Secondary | ICD-10-CM | POA: Diagnosis not present

## 2024-05-26 MED ORDER — OXYCODONE-ACETAMINOPHEN 5-325 MG PO TABS: 1.0000 | ORAL_TABLET | Freq: Four times a day (QID) | ORAL | 0 refills | Status: AC | PRN

## 2024-05-26 NOTE — ED Provider Notes (Signed)
 Lindsay Moore    CSN: [id number] Arrival date & time: 05/26/24  1839      History   Chief Complaint Chief Complaint  Patient presents with   Hand Injury    HPI Lindsay Moore is a 68 y.o. female.   HPI pleasant 68 year old female presents with left wrist pain secondary to secondary to fall 1 hour ago.  Patient is accompanied by her husband this evening.  PMH significant for TIA, HTN, COPD and HLD.  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    Hyperlipidemia 06/10/2013   Lumbar strain 01/06/2014   Mild mitral valve regurgitation 05/07/2020   Ocular migraine 06/10/2013   Palpitations 03/17/2020   Postmenopausal bleeding 08/17/2014   Primary osteoarthritis of both knees 08/31/2016   Right knee pain 06/10/2013   Workers Comp: [facility] Dr. Yvone    Septal defect 06/10/2013   Serous otitis media 01/06/2015   Situational anxiety 06/10/2013   TIA (transient ischemic attack) 06/10/2013    Patient Active Problem List   Diagnosis Date Noted   Plantar fasciitis, right 03/26/2023   Arthritis of right foot 03/26/2023   Achilles tendon pain 03/23/2023   Pain of right heel 03/23/2023   Annual physical exam 11/25/2021   Heart defect    AC (acromioclavicular)  joint bone spurs 05/07/2020   Mild mitral valve regurgitation 05/07/2020   Palpitations 03/17/2020   Chronic obstructive pulmonary disease with acute exacerbation (HCC) 03/04/2020   Closed displaced fracture of proximal phalanx of third toe of right foot 12/30/2019   Flexural eczema 11/23/2016   Primary osteoarthritis of both knees 08/31/2016   Biceps tendinitis on right 06/22/2015   Serous otitis media 01/06/2015   Postmenopausal bleeding 08/17/2014   History of abnormal cervical Pap smear 06/19/2014   Lumbar strain 01/06/2014   Breast mass 07/11/2013   Encounter for colonoscopy due to history of colonic polyp 06/19/2013   TIA (transient ischemic attack) 06/10/2013   Essential hypertension, benign 06/10/2013   Septal defect 06/10/2013   Ocular migraine 06/10/2013   Situational anxiety 06/10/2013   Hyperlipidemia 06/10/2013   Right knee pain 06/10/2013    Past Surgical History:  Procedure Laterality Date   bicep surgery Right    BREAST BIOPSY Left 08/28/2022   MM LT BREAST BX W LOC DEV EA AD LESION IMG BX SPEC STEREO GUIDE 08/28/2022 [facility]   BREAST BIOPSY Left 08/28/2022   MM LT BREAST BX W LOC DEV 1ST LESION IMAGE BX SPEC STEREO GUIDE 08/28/2022 [facility]   broken hip  07/03/1998   FOOT TENDON SURGERY  07/03/1998   GALLBLADDER SURGERY     orthoscopic knee surgery  10/31/2012   TONSILLECTOMY  07/03/1957   TUBAL LIGATION  07/03/1986    OB  History     Gravida  6   Para  5   Term      Preterm  2   AB      Living  5      SAB      IAB      Ectopic      Multiple      Live Births               Home Medications    Prior to Admission medications   Medication Sig Start Date End Date Taking? Authorizing Provider  oxyCODONE -acetaminophen  (PERCOCET/ROXICET) 5-325 MG tablet Take 1 tablet by mouth every 6 (six) hours as needed for severe pain (pain score 7-10). 05/26/24  Yes Teddy Sharper, FNP  acetaminophen  (TYLENOL ) 650 MG CR tablet  Take 1 tablet (650 mg total) by mouth every 8 (eight) hours as needed for pain. 12/07/16   Curtis Debby PARAS, MD  aspirin EC 81 MG tablet Take 1 tablet (81 mg total) by mouth daily. 06/10/13   Hessie Mallick, DO  atorvastatin  (LIPITOR) 10 MG tablet Take 1 tablet by mouth once daily 04/04/24   Jessup, Joy, NP  Calcium  Carb-Cholecalciferol (CALCIUM  + D3 PO) Take by mouth.    [provider]  carvedilol  (COREG ) 12.5 MG tablet TAKE 1 TABLET BY MOUTH TWICE DAILY WITH A MEAL 04/28/24   Willo Mini, NP  hydrochlorothiazide  (HYDRODIURIL ) 25 MG tablet Take 1 tablet by mouth once daily 12/12/23   Willo Mini, NP  MEGARED OMEGA-3 KRILL OIL PO Take by mouth.    [provider]  omeprazole  (PRILOSEC) 20 MG capsule Take 1 capsule by mouth once daily 02/25/24   Jessup, Joy, NP  oseltamivir  (TAMIFLU ) 75 MG capsule Take 1 capsule (75 mg total) by mouth 2 (two) times daily. 09/18/23   Willo Mini, NP  potassium chloride  (KLOR-CON ) 10 MEQ tablet Take 1 tablet by mouth once daily 05/09/24   Willo Mini, NP  RESTASIS 0.05 % ophthalmic emulsion 1 drop 2 (two) times daily. 07/08/20   [provider]  triamcinolone  ointment (KENALOG ) 0.1 % Apply 1 application topically 2 (two) times daily. To affected areas 05/26/19   Marsa Edelman, DO    Family History Family History  Problem Relation Age of Onset   Alcoholism Father    Lung cancer Father    Diabetes Other        grandmother   Hyperlipidemia Mother    Hypertension Mother    Stroke Other        grandmother    Social History Social History   Tobacco Use   Smoking status: Former    Current packs/day: 0.00    Average packs/day: 1 pack/day for 25.0 years (25.0 ttl pk-yrs)    Types: Cigarettes    Start date: 07/03/1972    Quit date: 07/03/1997    Years since quitting: 26.9   Smokeless tobacco: Never  Vaping Use   Vaping status: Never Used  Substance Use Topics   Alcohol use: Not Currently   Drug use: No     Allergies    Ibuprofen, Amlodipine , Bee venom, and Lisinopril    Review of Systems Review of Systems  Musculoskeletal:        Left wrist pain secondary to fall 1 hour ago     Physical Exam Triage Vital Signs ED Triage Vitals  Encounter Vitals Group     BP      Girls Systolic BP Percentile      Girls Diastolic BP  Percentile      Boys Systolic BP Percentile      Boys Diastolic BP Percentile      Pulse      Resp      Temp      Temp src      SpO2      Weight      Height      Head Circumference      Peak Flow      Pain Score      Pain Loc      Pain Education      Exclude from Growth Chart    No data found.  Updated Vital Signs BP (!) 181/94 (BP Location: Right Arm)   Pulse 94   Temp 97.8 F (36.6 C) (Oral)   Resp 18   Ht 5' 4 (1.626 m)   Wt 144 lb (65.3 kg)   SpO2 95%   BMI 24.72 kg/m   Physical Exam Vitals and nursing note reviewed.  Constitutional:      Appearance: Normal appearance. She is normal weight. She is not ill-appearing.  HENT:     Head: Normocephalic and atraumatic.     Mouth/Throat:     Mouth: Mucous membranes are moist.     Pharynx: Oropharynx is clear.  Eyes:     Extraocular Movements: Extraocular movements intact.     Conjunctiva/sclera: Conjunctivae normal.     Pupils: Pupils are equal, round, and reactive to light.  Cardiovascular:     Rate and Rhythm: Normal rate and regular rhythm.     Pulses: Normal pulses.     Heart sounds: Normal heart sounds.  Pulmonary:     Effort: Pulmonary effort is normal.     Breath sounds: Normal breath sounds. No wheezing, rhonchi or rales.  Musculoskeletal:        General: Normal range of motion.     Cervical back: Normal range of motion and neck supple.     Comments: Left wrist (dorsum): TTP with soft tissue swelling over distal radius, exam limited due to pain this evening  Skin:    General: Skin is warm and dry.  Neurological:     General: No focal deficit present.     Mental Status: She is alert and  oriented to person, place, and time. Mental status is at baseline.  Psychiatric:        Mood and Affect: Mood normal.        Behavior: Behavior normal.        Thought Content: Thought content normal.      UC Treatments / Results  Labs (all labs ordered are listed, but only abnormal results are displayed) Labs Reviewed - No data to display  EKG   Radiology DG Wrist Complete Left Result Date: 05/26/2024 CLINICAL DATA:  fall, pain EXAM: LEFT WRIST - COMPLETE 3+ VIEW COMPARISON:  None Available. FINDINGS: Mildly decreased bony mineralization. Longitudinal lucency along the articular surface of the distal radius.No dislocation. Moderate joint space loss of the triscaphe joint. Soft tissues are unremarkable. IMPRESSION: Longitudinal lucency along the articular surface of the distal radius, worrisome for a nondisplaced, intra-articular radial styloid fracture. Electronically Signed   By: Rogelia Myers M.D.   On: 05/26/2024 19:16    Procedures Procedures (including critical Moore time)  Medications Ordered in UC Medications - No data to display  Initial Impression / Assessment and Plan / UC Course  I have reviewed the triage vital signs and the nursing notes.  Pertinent labs &  imaging results that were available during my Moore of the patient were reviewed by me and considered in my medical decision making (see chart for details).     MDM: 1.  Nondisplaced fracture of left radial styloid process, initial encounter for closed fracture-DG left wrist x-ray results revealed above, patient and husband advised, Rx'd Percocet 5/325 mg tablet: Take 1 tablet every 8 hours, as needed for severe left wrist pain; 2.  Injury of left wrist, initial encounter-left wrist brace with thumb spica placed on left wrist with  specific instructions for patient. Advised patient of left wrist x-ray results with hardcopy and image provided.  Advised patient to wear left wrist brace 24/7 (except when bathing) until  being evaluated by [facility] orthopedics.  Advised patient to OTC Tylenol  500 to 1000 mg daily, as needed for left wrist pain.  Advised patient may take Percocet daily or as needed for left wrist pain.  Patient advised of sedative effects of this medication.  Encouraged increase daily water intake to 64 ounces per day while taking these medications.  Advised patient to follow-up with [facility] orthopedics for fracture management off nondisplaced radial styloid fracture.  Patient discharged home, hemodynamically stable. Final Clinical Impressions(s) / UC Diagnoses   Final diagnoses:  Nondisplaced fracture of left radial styloid process, initial encounter for closed fracture  Injury of left wrist, initial encounter     Discharge Instructions      Advised patient of left wrist x-ray results with hardcopy and image provided.  Advised patient to wear left wrist brace 24/7 (except when bathing) until being evaluated by [facility] orthopedics.  Advised patient to OTC Tylenol  500 to 1000 mg daily, as needed for left wrist pain.  Advised patient may take Percocet daily or as needed for left wrist pain.  Patient advised of sedative effects of this medication.  Encouraged increase daily water intake to 64 ounces per day while taking these medications.  Advised patient to follow-up with [facility] orthopedics for fracture management off nondisplaced radial styloid fracture.     ED Prescriptions     Medication Sig Dispense Auth. Provider   oxyCODONE -acetaminophen  (PERCOCET/ROXICET) 5-325 MG tablet Take 1 tablet by mouth every 6 (six) hours as needed for severe pain (pain score 7-10). 15 tablet Adil Tugwell, FNP      I have reviewed the PDMP during this encounter.   Teddy Sharper, FNP 05/26/24 1946

## 2024-05-26 NOTE — ED Triage Notes (Signed)
 Pt presenting with c/o pain to left wrist post fall 1 hour ago. Pt stated that she fell on both hands and bil knees however her left hand was injured. No medication taken for pain

## 2024-05-26 NOTE — Discharge Instructions (Addendum)
 Advised patient of left wrist x-ray results with hardcopy and image provided.  Advised patient to wear left wrist brace 24/7 (except when bathing) until being evaluated by [facility] orthopedics.  Advised patient to OTC Tylenol  500 to 1000 mg daily, as needed for left wrist pain.  Advised patient may take Percocet daily or as needed for left wrist pain.  Patient advised of sedative effects of this medication.  Encouraged increase daily water intake to 64 ounces per day while taking these medications.  Advised patient to follow-up with [facility] orthopedics for fracture management off nondisplaced radial styloid fracture.

## 2024-06-07 ENCOUNTER — Other Ambulatory Visit: Payer: Self-pay | Admitting: Medical-Surgical

## 2024-06-07 DIAGNOSIS — I10 Essential (primary) hypertension: Secondary | ICD-10-CM

## 2024-06-09 ENCOUNTER — Ambulatory Visit

## 2024-06-09 ENCOUNTER — Ambulatory Visit (HOSPITAL_BASED_OUTPATIENT_CLINIC_OR_DEPARTMENT_OTHER): Admission: RE | Admit: 2024-06-09 | Discharge: 2024-06-09 | Disposition: A | Source: Ambulatory Visit

## 2024-06-09 VITALS — BP 177/100 | Ht 64.0 in | Wt 150.0 lb

## 2024-06-09 DIAGNOSIS — M19032 Primary osteoarthritis, left wrist: Secondary | ICD-10-CM | POA: Diagnosis not present

## 2024-06-09 DIAGNOSIS — S52515D Nondisplaced fracture of left radial styloid process, subsequent encounter for closed fracture with routine healing: Secondary | ICD-10-CM | POA: Diagnosis not present

## 2024-06-09 NOTE — Progress Notes (Signed)
   Subjective:    Patient ID: Jaydalynn Olivero, female    DOB: 68 y.o., [birth year]   MRN: [id number]  Chief Complaint: Left wrist pain  Discussed the use of AI scribe software for clinical note transcription with the patient, who gave verbal consent to proceed.  History of Present Illness Remell is a 68 year old female with past medical history significant for TIA, hypertension, COPD, situational anxiety presenting for evaluation of left wrist pain after a fall on 05/26/2024 (2 weeks ago).  She was evaluated in urgent care that same day and diagnosed with a nondisplaced radial styloid fracture.  Placed in a wrist brace and referred to myself for further evaluation. She reports she has been assiduous with use of the brace. Still painful both in the region of the fracture and slightly further along the proximal arm. No numbness or tingling  Review of pertinent imaging: 05/26/2024 left wrist x-rays revealing radial styloid fracture which is nondisplaced but does have approximately 35% intra-articular involvement.    Objective:   Vitals:   06/09/24 0941  BP: (!) 177/100   Left wrist: There is bruising present over the dorso radial aspect of the wrist with tenderness to palpation over the radial styloid.  There is a small focus of some bruising slightly further proximal on the forearm. She has slightly limited range of motion with flexion, extension, radial deviation, ulnar deviation 2/2 pain on the radial aspect of the wrist. She has full flexion/extension at the elbow.  She has a mildly diminished pronation and moderately diminished supination at the wrist 2/2 pain. Full and intact sensation throughout the left upper extremity. 2+ radial pulse.  3 view plain film radiographs obtained of the left wrist today per my independent review revealing a more evident fracture in the aforementioned region now with approximately 1 mm increased displacement from prior plain films.      Assessment & Plan:   Assessment & Plan Wanda is a pleasant 68 year old female with a left radial styloid fracture which appears to be largely uncomplicated.  Repeat x-rays today (2 weeks since injury) show a potentially tiny increase in displacement.  Given that she has not been adequately immobilized, I would not consider this to be failure of conservative treatment.  Based on her encouraging exam today in terms of no deformity, I believe she is likely to respond well to continued, though albeit more appropriate, conservative therapy with a fitted Exos brace.  Fitted her for this today and scheduled her for 2-week follow-up for repeat imaging and reassessment.

## 2024-06-23 ENCOUNTER — Ambulatory Visit

## 2024-06-23 ENCOUNTER — Ambulatory Visit (HOSPITAL_BASED_OUTPATIENT_CLINIC_OR_DEPARTMENT_OTHER)
Admission: RE | Admit: 2024-06-23 | Discharge: 2024-06-23 | Disposition: A | Discharge status: Home / Self Care | Source: Ambulatory Visit

## 2024-06-23 ENCOUNTER — Other Ambulatory Visit: Payer: Self-pay

## 2024-06-23 VITALS — Ht 64.0 in | Wt 150.0 lb

## 2024-06-23 DIAGNOSIS — S52515D Nondisplaced fracture of left radial styloid process, subsequent encounter for closed fracture with routine healing: Secondary | ICD-10-CM | POA: Insufficient documentation

## 2024-06-23 NOTE — Progress Notes (Signed)
" ° °  Subjective:    Patient ID: Lindsay Moore, female    DOB: 68 y.o., 1956-04-29   MRN: [id number]  Chief Complaint: Nondisplaced left radial styloid fracture (4-week follow-up)  Discussed the use of AI scribe software for clinical note transcription with the patient, who gave verbal consent to proceed.  History of Present Illness Lindsay Moore is a pleasant 68 year old female presenting for 4-week follow-up on a nondisplaced left radial styloid fracture.  She was evaluated by myself on 06/09/2024 and placed in an Exos brace at that time.  She presents today for reevaluation.   Left wrist pain and stiffness - Persistent stiffness, tenderness, and swelling of the left wrist - End-range pain limits full wrist extension - Finger motion causes wrist discomfort - Thumb motion occasionally causes radiating sensations to the top of the head - Wrist and hand feel stiff and sore from immobilization - Inadvertent wrist movements cause pain  Functional limitations and immobilization - Splint worn continuously for four weeks, removed only for showering - Fingers used for light daily tasks, but avoids pressure or torque on the wrist - No new trauma or change in symptoms since the initial injury - Consistent immobilization maintained over the four weeks since the fracture   3 view plain film radiographs obtained of the left wrist today per my independent review revealing robust interval bony callus formation.  No displacement.    Objective:   There were no vitals filed for this visit.  Left wrist: TTP along radial aspect of wrist. Modest swelling present in wrist without bruising. 2+ radial pulse. Cap refill <3 seconds distally.     Assessment & Plan:   Assessment & Plan Lindsay Moore has done well with her nondisplaced left radial styloid fracture sustained from a ground-level mechanical fall on 11/24.  Recommended she remain in brace for additional 3 weeks.  Follow-up in 4 weeks for repeat  radiographs to assess interval healing and likely discontinue brace at that time.  Closed nondisplaced fracture of left radial styloid process, routine healing Four weeks post-injury, she experiences expected stiffness, tenderness, and swelling due to immobilization. Healing progresses appropriately without complications, with full recovery anticipated in six to eight weeks. Left wrist radiographs were obtained to reevaluate which demonstrate appropriate interval bone healing and alignment. She is advised to continue using her fingers and take off brace periodically while she is not attempting to hold or grip anything with her left hand and to engage in gentle range of motion exercises for fingers and wrist as tolerated but to listen to her body whilst doing so.  Continue brace use.  A follow-up 7 weeks postinjury to reassess healing and obtain radiographs at that time.   "

## 2024-07-02 ENCOUNTER — Other Ambulatory Visit: Payer: Self-pay | Admitting: Medical-Surgical

## 2024-07-14 ENCOUNTER — Ambulatory Visit (HOSPITAL_BASED_OUTPATIENT_CLINIC_OR_DEPARTMENT_OTHER)
Admission: RE | Admit: 2024-07-14 | Discharge: 2024-07-14 | Disposition: A | Discharge status: Home / Self Care | Source: Ambulatory Visit

## 2024-07-14 ENCOUNTER — Ambulatory Visit: Payer: Self-pay

## 2024-07-14 ENCOUNTER — Ambulatory Visit

## 2024-07-14 VITALS — BP 150/106 | HR 90 | Temp 97.8°F | Ht 64.0 in | Wt 150.0 lb

## 2024-07-14 DIAGNOSIS — S52515D Nondisplaced fracture of left radial styloid process, subsequent encounter for closed fracture with routine healing: Secondary | ICD-10-CM

## 2024-07-14 DIAGNOSIS — G5602 Carpal tunnel syndrome, left upper limb: Secondary | ICD-10-CM

## 2024-07-14 NOTE — Progress Notes (Signed)
" ° °  Subjective:    Patient ID: Lindsay Moore, female    DOB: 69 y.o., January 08, 1956   MRN: [id number]  Chief Complaint: Left radial styloid process fracture  History of Present Illness  Patient reports she has been wearing the Exos brace consistently but only taking it out on occasion just to shower or mobilize fingers. Pain is significantly improved Does endorse occasional numbness/tingling radiating into the first 4 digits of her left hand on occasion. Seems more prominent at night     Objective:   Vitals:   07/14/24 0930  BP: (!) 150/106  Pulse: 90  Temp: 97.8 F (36.6 C)  SpO2: 98%    Left wrist: 20 degrees flexion, 20 degrees extension.   Lacking approximately 5 degrees with supination and pronation. Mild swelling of the area. No bruising appreciable. 2+ radial pulse.  Cap refill less than 3 seconds distally. Intact sensation throughout the left hand and wrist Tinel's and Durkan/Phalen's deferred at this time in the context of her fracture.  3 view plain film radiographs obtained of the left wrist today per my independent review revealing no displacement.  Robust bony callus formation which appears to be an interval improvement compared to last plain films obtained approximately 3 weeks ago. Near complete radiographic healing of prior noted radial styloid fracture, with persistent fracture line visualized of the intra-articular portion.    Assessment & Plan:   Assessment & Plan Left radial styloid process fracture with potential mild carpal tunnel syndrome Diagnosed on 05/26/2024.  1 mm displacement at that time.  Follow-up radiographs 3 weeks later showed no displacement with robust bony callus formation.  Today radiographs show appropriate interval healing, however the fracture line persists particularly in the intra-articular region.  I recommend follow-up in 2 weeks.  We will obtain repeat radiographs on her way in the door next time.  Continue Exos brace for now.   Consider further discussion regarding likely carpal tunnel syndrome left wrist at that time.  I suspect some of this is related to the direct compressive force that the Exos brace is providing over her volar wrist which may resolve spontaneously with discontinuation.    "

## 2024-07-26 ENCOUNTER — Other Ambulatory Visit: Payer: Self-pay | Admitting: Medical-Surgical

## 2024-07-28 ENCOUNTER — Ambulatory Visit

## 2024-07-30 ENCOUNTER — Ambulatory Visit (HOSPITAL_BASED_OUTPATIENT_CLINIC_OR_DEPARTMENT_OTHER)
Admission: RE | Admit: 2024-07-30 | Discharge: 2024-07-30 | Disposition: A | Discharge status: Home / Self Care | Source: Ambulatory Visit

## 2024-07-30 ENCOUNTER — Ambulatory Visit

## 2024-07-30 VITALS — BP 180/100 | Ht 64.0 in | Wt 150.0 lb

## 2024-07-30 DIAGNOSIS — G5602 Carpal tunnel syndrome, left upper limb: Secondary | ICD-10-CM | POA: Diagnosis not present

## 2024-07-30 DIAGNOSIS — S52515D Nondisplaced fracture of left radial styloid process, subsequent encounter for closed fracture with routine healing: Secondary | ICD-10-CM

## 2024-07-30 NOTE — Progress Notes (Signed)
" ° °  Subjective:    Patient ID: Lindsay Moore, female    DOB: 69 y.o., Mar 30, 1956   MRN: [id number]  Chief Complaint: Left radial styloid process fracture (7 week follow up)  History of Present Illness  fall on 05/26/2024  Left radial styloid fracture, nondisplaced but involving ~35% of the radiocarpal joint. Immobilization started 06/09/24 Doing well with this but at last visit on 1/12 had mild symptoms mimicking carpal tunnel Today she reports exos brace fastener is malfunctioning and not tightening properly Holding off on wrist exercises and carpal tunnel syndrome symptoms have improved     Objective:   Vitals:   07/30/24 0832  BP: (!) 180/100   Left wrist: 20 degrees flexion, 35 degrees extension.   Lacking approximately 5 degrees with supination and pronation. Mild swelling of the area. No bruising appreciable. TTP present over the radial styloid 2+ radial pulse.  Cap refill less than 3 seconds distally. Intact sensation throughout the left hand and wrist  3 view plain film radiographs obtained of the left wrist per my independent evaluation revealing interval bony callus formation and appropriate healing compared to prior of previously documented radial styloid fracture with not quite complete healing observed.     Assessment & Plan:   Assessment & Plan Medford has been compliant with Exos brace but due to inability of the now malfunctioning brace to completely fasten, we will transition her to a wrist brace for 4 weeks and have her follow-up with myself at the end of that time.  We will obtain repeat radiographs on her way in the door for that visit and I will place a referral down to hand therapy.  I have encouraged by the lack of carpal tunnel syndrome symptoms and this may have improved in the presence of her limited wrist exercises while wearing the more compressive Exos brace.   "

## 2024-08-06 ENCOUNTER — Other Ambulatory Visit: Payer: Self-pay | Admitting: Medical-Surgical

## 2024-08-29 ENCOUNTER — Ambulatory Visit

## 2024-12-03 ENCOUNTER — Encounter: Admitting: Medical-Surgical
